# Patient Record
Sex: Female | Born: 1950 | Race: White | Hispanic: No | Marital: Married | Smoking: Former smoker
Health system: Southern US, Community
[De-identification: ages and names within clinical notes are randomized; demographics above are authoritative.]

## PROBLEM LIST (undated history)

## (undated) DIAGNOSIS — T7840XA Allergy, unspecified, initial encounter: Secondary | ICD-10-CM

## (undated) DIAGNOSIS — R7989 Other specified abnormal findings of blood chemistry: Secondary | ICD-10-CM

## (undated) DIAGNOSIS — IMO0002 Reserved for concepts with insufficient information to code with codable children: Secondary | ICD-10-CM

## (undated) DIAGNOSIS — M1990 Unspecified osteoarthritis, unspecified site: Secondary | ICD-10-CM

## (undated) DIAGNOSIS — Z9889 Other specified postprocedural states: Secondary | ICD-10-CM

## (undated) DIAGNOSIS — R7303 Prediabetes: Secondary | ICD-10-CM

## (undated) DIAGNOSIS — R011 Cardiac murmur, unspecified: Secondary | ICD-10-CM

## (undated) DIAGNOSIS — R112 Nausea with vomiting, unspecified: Secondary | ICD-10-CM

## (undated) DIAGNOSIS — M810 Age-related osteoporosis without current pathological fracture: Secondary | ICD-10-CM

## (undated) DIAGNOSIS — Z5189 Encounter for other specified aftercare: Secondary | ICD-10-CM

## (undated) DIAGNOSIS — J45909 Unspecified asthma, uncomplicated: Secondary | ICD-10-CM

## (undated) DIAGNOSIS — E669 Obesity, unspecified: Secondary | ICD-10-CM

## (undated) DIAGNOSIS — I499 Cardiac arrhythmia, unspecified: Secondary | ICD-10-CM

## (undated) DIAGNOSIS — Z86010 Personal history of colonic polyps: Secondary | ICD-10-CM

## (undated) DIAGNOSIS — C801 Malignant (primary) neoplasm, unspecified: Secondary | ICD-10-CM

## (undated) DIAGNOSIS — R319 Hematuria, unspecified: Secondary | ICD-10-CM

## (undated) DIAGNOSIS — J189 Pneumonia, unspecified organism: Secondary | ICD-10-CM

## (undated) DIAGNOSIS — R42 Dizziness and giddiness: Secondary | ICD-10-CM

## (undated) DIAGNOSIS — Z889 Allergy status to unspecified drugs, medicaments and biological substances status: Secondary | ICD-10-CM

## (undated) DIAGNOSIS — I4891 Unspecified atrial fibrillation: Secondary | ICD-10-CM

## (undated) HISTORY — DX: Pneumonia, unspecified organism: J18.9

## (undated) HISTORY — PX: SUPERFICIAL PERONEAL NERVE RELEASE: SHX6200

## (undated) HISTORY — PX: CARDIAC ELECTROPHYSIOLOGY STUDY AND ABLATION: SHX1294

## (undated) HISTORY — PX: CERVICAL CONE BIOPSY: SUR198

## (undated) HISTORY — DX: Age-related osteoporosis without current pathological fracture: M81.0

## (undated) HISTORY — DX: Personal history of colonic polyps: Z86.010

## (undated) HISTORY — DX: Dizziness and giddiness: R42

## (undated) HISTORY — DX: Unspecified osteoarthritis, unspecified site: M19.90

## (undated) HISTORY — DX: Reserved for concepts with insufficient information to code with codable children: IMO0002

## (undated) HISTORY — DX: Encounter for other specified aftercare: Z51.89

## (undated) HISTORY — DX: Unspecified atrial fibrillation: I48.91

## (undated) HISTORY — DX: Other specified abnormal findings of blood chemistry: R79.89

## (undated) HISTORY — DX: Obesity, unspecified: E66.9

## (undated) HISTORY — DX: Allergy, unspecified, initial encounter: T78.40XA

---
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
## 1991-03-19 HISTORY — PX: OTHER SURGICAL HISTORY: SHX169

## 1997-03-18 HISTORY — PX: ROTATOR CUFF REPAIR: SHX139

## 2004-12-20 ENCOUNTER — Emergency Department (HOSPITAL_COMMUNITY): Admission: EM | Admit: 2004-12-20 | Discharge: 2004-12-20 | Payer: Self-pay | Admitting: Emergency Medicine

## 2005-04-05 ENCOUNTER — Emergency Department (HOSPITAL_COMMUNITY): Admission: EM | Admit: 2005-04-05 | Discharge: 2005-04-05 | Payer: Self-pay | Admitting: *Deleted

## 2005-12-09 ENCOUNTER — Inpatient Hospital Stay (HOSPITAL_BASED_OUTPATIENT_CLINIC_OR_DEPARTMENT_OTHER): Admission: RE | Admit: 2005-12-09 | Discharge: 2005-12-09 | Payer: Self-pay | Admitting: Cardiology

## 2007-03-19 HISTORY — PX: UPPER GASTROINTESTINAL ENDOSCOPY: SHX188

## 2007-03-19 HISTORY — PX: OOPHORECTOMY: SHX86

## 2007-03-19 HISTORY — PX: COLONOSCOPY: SHX174

## 2007-09-02 LAB — HM COLONOSCOPY

## 2007-12-07 ENCOUNTER — Ambulatory Visit (HOSPITAL_BASED_OUTPATIENT_CLINIC_OR_DEPARTMENT_OTHER): Admission: RE | Admit: 2007-12-07 | Discharge: 2007-12-07 | Payer: Self-pay | Admitting: Obstetrics and Gynecology

## 2007-12-07 ENCOUNTER — Encounter: Payer: Self-pay | Admitting: Obstetrics and Gynecology

## 2008-06-10 ENCOUNTER — Other Ambulatory Visit: Admission: RE | Admit: 2008-06-10 | Discharge: 2008-06-10 | Payer: Self-pay | Admitting: Obstetrics and Gynecology

## 2008-07-06 ENCOUNTER — Inpatient Hospital Stay (HOSPITAL_COMMUNITY): Admission: RE | Admit: 2008-07-06 | Discharge: 2008-07-09 | Payer: Self-pay | Admitting: Orthopedic Surgery

## 2010-01-29 ENCOUNTER — Ambulatory Visit (HOSPITAL_COMMUNITY): Admission: RE | Admit: 2010-01-29 | Discharge: 2010-01-30 | Payer: Self-pay | Admitting: Orthopedic Surgery

## 2010-04-26 ENCOUNTER — Other Ambulatory Visit (HOSPITAL_COMMUNITY): Payer: Self-pay | Admitting: Orthopedic Surgery

## 2010-04-26 DIAGNOSIS — M25552 Pain in left hip: Secondary | ICD-10-CM

## 2010-04-26 DIAGNOSIS — T84031A Mechanical loosening of internal left hip prosthetic joint, initial encounter: Secondary | ICD-10-CM

## 2010-05-08 ENCOUNTER — Ambulatory Visit (HOSPITAL_COMMUNITY)
Admission: RE | Admit: 2010-05-08 | Discharge: 2010-05-08 | Disposition: A | Discharge status: Home / Self Care | Payer: BC Managed Care – PPO | Source: Ambulatory Visit | Attending: Orthopedic Surgery | Admitting: Orthopedic Surgery

## 2010-05-08 DIAGNOSIS — T84031A Mechanical loosening of internal left hip prosthetic joint, initial encounter: Secondary | ICD-10-CM

## 2010-05-08 DIAGNOSIS — Z96649 Presence of unspecified artificial hip joint: Secondary | ICD-10-CM | POA: Insufficient documentation

## 2010-05-08 DIAGNOSIS — M25552 Pain in left hip: Secondary | ICD-10-CM

## 2010-05-08 DIAGNOSIS — R946 Abnormal results of thyroid function studies: Secondary | ICD-10-CM | POA: Insufficient documentation

## 2010-05-08 MED ORDER — TECHNETIUM TC 99M MEDRONATE IV KIT
25.0000 | PACK | Freq: Once | INTRAVENOUS | Status: AC | PRN
  Administered 2010-05-08: 25 via INTRAVENOUS

## 2010-05-08 MED ORDER — TECHNETIUM TC 99M MEDRONATE IV KIT: 25.0000 | PACK | Freq: Once | INTRAVENOUS | Status: DC | PRN

## 2010-05-29 LAB — COMPREHENSIVE METABOLIC PANEL
ALT: 21 U/L (ref 0–35)
AST: 24 U/L (ref 0–37)
Albumin: 4.3 g/dL (ref 3.5–5.2)
Alkaline Phosphatase: 53 U/L (ref 39–117)
BUN: 15 mg/dL (ref 6–23)
CO2: 27 mEq/L (ref 19–32)
Calcium: 9.5 mg/dL (ref 8.4–10.5)
Chloride: 105 mEq/L (ref 96–112)
Creatinine, Ser: 0.87 mg/dL (ref 0.4–1.2)
GFR calc Af Amer: >60 mL/min (ref >60)
GFR calc non Af Amer: >60 mL/min (ref >60)
Glucose, Bld: 104 mg/dL — ABNORMAL HIGH (ref 70–99)
Potassium: 4.2 mEq/L (ref 3.5–5.1)
Sodium: 141 mEq/L (ref 135–145)
Total Bilirubin: 0.7 mg/dL (ref 0.3–1.2)
Total Protein: 7.1 g/dL (ref 6.0–8.3)

## 2010-05-29 LAB — CBC
HCT: 39.1 % (ref 36.0–46.0)
Hemoglobin: 13.6 g/dL (ref 12.0–15.0)
MCH: 32.2 pg (ref 26.0–34.0)
MCHC: 34.7 g/dL (ref 30.0–36.0)
MCV: 93 fL (ref 78.0–100.0)
Platelets: 203 10*3/uL (ref 150–400)
RBC: 4.21 MIL/uL (ref 3.87–5.11)
RDW: 13.3 % (ref 11.5–15.5)
WBC: 5.9 10*3/uL (ref 4.0–10.5)

## 2010-05-29 LAB — TYPE AND SCREEN
ABO/RH(D): A POS
Antibody Screen: NEGATIVE

## 2010-05-29 LAB — URINALYSIS, ROUTINE W REFLEX MICROSCOPIC
Bilirubin Urine: NEGATIVE
Glucose, UA: NEGATIVE mg/dL
Ketones, ur: NEGATIVE mg/dL
Leukocytes, UA: NEGATIVE
Nitrite: NEGATIVE
Protein, ur: NEGATIVE mg/dL
Specific Gravity, Urine: 1.01 (ref 1.005–1.030)
Urobilinogen, UA: 0.2 mg/dL (ref 0.0–1.0)
pH: 6.5 (ref 5.0–8.0)

## 2010-05-29 LAB — BODY FLUID CULTURE
Culture: NO GROWTH
Gram Stain: NONE SEEN

## 2010-05-29 LAB — PROTIME-INR
INR: 0.97 (ref 0.00–1.49)
Prothrombin Time: 13.1 seconds (ref 11.6–15.2)

## 2010-05-29 LAB — SURGICAL PCR SCREEN
MRSA, PCR: NEGATIVE
Staphylococcus aureus: NEGATIVE

## 2010-05-29 LAB — ANAEROBIC CULTURE: Gram Stain: NONE SEEN

## 2010-05-29 LAB — GRAM STAIN: Gram Stain: NONE SEEN

## 2010-05-29 LAB — APTT: aPTT: 32 seconds (ref 24–37)

## 2010-05-29 LAB — URINE MICROSCOPIC-ADD ON

## 2010-05-31 ENCOUNTER — Ambulatory Visit (HOSPITAL_COMMUNITY)
Admission: RE | Admit: 2010-05-31 | Discharge: 2010-05-31 | Disposition: A | Discharge status: Home / Self Care | Payer: BC Managed Care – PPO | Source: Ambulatory Visit | Attending: Orthopedic Surgery | Admitting: Orthopedic Surgery

## 2010-05-31 ENCOUNTER — Encounter (HOSPITAL_COMMUNITY): Payer: BC Managed Care – PPO

## 2010-05-31 ENCOUNTER — Other Ambulatory Visit: Payer: Self-pay | Admitting: Orthopedic Surgery

## 2010-05-31 ENCOUNTER — Other Ambulatory Visit (HOSPITAL_COMMUNITY): Payer: Self-pay | Admitting: Orthopedic Surgery

## 2010-05-31 DIAGNOSIS — Z01818 Encounter for other preprocedural examination: Secondary | ICD-10-CM | POA: Insufficient documentation

## 2010-05-31 DIAGNOSIS — I4891 Unspecified atrial fibrillation: Secondary | ICD-10-CM | POA: Insufficient documentation

## 2010-05-31 DIAGNOSIS — J45909 Unspecified asthma, uncomplicated: Secondary | ICD-10-CM | POA: Insufficient documentation

## 2010-05-31 DIAGNOSIS — Z01812 Encounter for preprocedural laboratory examination: Secondary | ICD-10-CM | POA: Insufficient documentation

## 2010-05-31 DIAGNOSIS — M329 Systemic lupus erythematosus, unspecified: Secondary | ICD-10-CM | POA: Insufficient documentation

## 2010-05-31 DIAGNOSIS — I10 Essential (primary) hypertension: Secondary | ICD-10-CM | POA: Insufficient documentation

## 2010-05-31 DIAGNOSIS — Z01811 Encounter for preprocedural respiratory examination: Secondary | ICD-10-CM

## 2010-05-31 DIAGNOSIS — Z96649 Presence of unspecified artificial hip joint: Secondary | ICD-10-CM | POA: Insufficient documentation

## 2010-05-31 DIAGNOSIS — M25559 Pain in unspecified hip: Secondary | ICD-10-CM | POA: Insufficient documentation

## 2010-05-31 DIAGNOSIS — Z79899 Other long term (current) drug therapy: Secondary | ICD-10-CM | POA: Insufficient documentation

## 2010-05-31 LAB — CBC
HCT: 38.2 % (ref 36.0–46.0)
Hemoglobin: 12.4 g/dL (ref 12.0–15.0)
MCH: 29.8 pg (ref 26.0–34.0)
MCHC: 32.5 g/dL (ref 30.0–36.0)
MCV: 91.8 fL (ref 78.0–100.0)
Platelets: 206 10*3/uL (ref 150–400)
RBC: 4.16 MIL/uL (ref 3.87–5.11)
RDW: 13.1 % (ref 11.5–15.5)
WBC: 5.4 10*3/uL (ref 4.0–10.5)

## 2010-05-31 LAB — COMPREHENSIVE METABOLIC PANEL
ALT: 18 U/L (ref 0–35)
AST: 22 U/L (ref 0–37)
Albumin: 3.9 g/dL (ref 3.5–5.2)
Alkaline Phosphatase: 61 U/L (ref 39–117)
BUN: 14 mg/dL (ref 6–23)
CO2: 28 mEq/L (ref 19–32)
Calcium: 9.4 mg/dL (ref 8.4–10.5)
Chloride: 106 mEq/L (ref 96–112)
Creatinine, Ser: 0.86 mg/dL (ref 0.4–1.2)
GFR calc Af Amer: >60 mL/min (ref >60)
GFR calc non Af Amer: >60 mL/min (ref >60)
Glucose, Bld: 94 mg/dL (ref 70–99)
Potassium: 4.1 mEq/L (ref 3.5–5.1)
Sodium: 139 mEq/L (ref 135–145)
Total Bilirubin: 0.4 mg/dL (ref 0.3–1.2)
Total Protein: 7 g/dL (ref 6.0–8.3)

## 2010-05-31 LAB — URINALYSIS, ROUTINE W REFLEX MICROSCOPIC
Glucose, UA: NEGATIVE mg/dL
Ketones, ur: NEGATIVE mg/dL
Leukocytes, UA: NEGATIVE
Protein, ur: NEGATIVE mg/dL
Urobilinogen, UA: 0.2 mg/dL (ref 0.0–1.0)

## 2010-05-31 LAB — URINE MICROSCOPIC-ADD ON

## 2010-05-31 LAB — SURGICAL PCR SCREEN: MRSA, PCR: NEGATIVE

## 2010-05-31 LAB — PROTIME-INR
INR: 0.99 (ref 0.00–1.49)
Prothrombin Time: 13.3 seconds (ref 11.6–15.2)

## 2010-05-31 LAB — APTT: aPTT: 31 seconds (ref 24–37)

## 2010-06-06 ENCOUNTER — Inpatient Hospital Stay (HOSPITAL_COMMUNITY)
Admission: RE | Admit: 2010-06-06 | Discharge: 2010-06-08 | DRG: 817 | Disposition: A | Discharge status: Home Health | Payer: BC Managed Care – PPO | Source: Ambulatory Visit | Attending: Orthopedic Surgery | Admitting: Orthopedic Surgery

## 2010-06-06 ENCOUNTER — Inpatient Hospital Stay (HOSPITAL_COMMUNITY): Payer: BC Managed Care – PPO

## 2010-06-06 DIAGNOSIS — T84099A Other mechanical complication of unspecified internal joint prosthesis, initial encounter: Principal | ICD-10-CM | POA: Diagnosis present

## 2010-06-06 DIAGNOSIS — Z8701 Personal history of pneumonia (recurrent): Secondary | ICD-10-CM

## 2010-06-06 DIAGNOSIS — Z87440 Personal history of urinary (tract) infections: Secondary | ICD-10-CM

## 2010-06-06 DIAGNOSIS — I4891 Unspecified atrial fibrillation: Secondary | ICD-10-CM | POA: Diagnosis present

## 2010-06-06 DIAGNOSIS — Y831 Surgical operation with implant of artificial internal device as the cause of abnormal reaction of the patient, or of later complication, without mention of misadventure at the time of the procedure: Secondary | ICD-10-CM | POA: Diagnosis present

## 2010-06-06 DIAGNOSIS — K219 Gastro-esophageal reflux disease without esophagitis: Secondary | ICD-10-CM | POA: Diagnosis present

## 2010-06-06 DIAGNOSIS — Z96649 Presence of unspecified artificial hip joint: Secondary | ICD-10-CM

## 2010-06-06 DIAGNOSIS — Z87898 Personal history of other specified conditions: Secondary | ICD-10-CM

## 2010-06-06 DIAGNOSIS — Z7982 Long term (current) use of aspirin: Secondary | ICD-10-CM

## 2010-06-06 DIAGNOSIS — I10 Essential (primary) hypertension: Secondary | ICD-10-CM | POA: Diagnosis present

## 2010-06-06 LAB — GRAM STAIN

## 2010-06-06 LAB — TYPE AND SCREEN: ABO/RH(D): A POS

## 2010-06-07 LAB — BASIC METABOLIC PANEL
Chloride: 102 mEq/L (ref 96–112)
Creatinine, Ser: 0.88 mg/dL (ref 0.4–1.2)
GFR calc Af Amer: >60 mL/min (ref >60)
Potassium: 4.4 mEq/L (ref 3.5–5.1)
Sodium: 139 mEq/L (ref 135–145)

## 2010-06-07 LAB — CBC
HCT: 35.5 % — ABNORMAL LOW (ref 36.0–46.0)
MCHC: 32.7 g/dL (ref 30.0–36.0)
MCV: 91 fL (ref 78.0–100.0)
Platelets: 176 10*3/uL (ref 150–400)
RDW: 13.1 % (ref 11.5–15.5)

## 2010-06-08 LAB — CBC
HCT: 35.1 % — ABNORMAL LOW (ref 36.0–46.0)
Hemoglobin: 11.5 g/dL — ABNORMAL LOW (ref 12.0–15.0)
MCH: 29.9 pg (ref 26.0–34.0)
RBC: 3.84 MIL/uL — ABNORMAL LOW (ref 3.87–5.11)

## 2010-06-08 LAB — BASIC METABOLIC PANEL
CO2: 27 mEq/L (ref 19–32)
Chloride: 102 mEq/L (ref 96–112)
Creatinine, Ser: 0.77 mg/dL (ref 0.4–1.2)
GFR calc Af Amer: >60 mL/min (ref >60)

## 2010-06-10 LAB — BODY FLUID CULTURE: Culture: NO GROWTH

## 2010-06-11 LAB — ANAEROBIC CULTURE

## 2010-06-11 NOTE — Op Note (Signed)
Donna Moon, Donna Moon                   ACCOUNT NO.:  [id number]  MEDICAL RECORD NO.:  [id number]           PATIENT TYPE:  I  LOCATION:  1618                         FACILITY:  [facility]  PHYSICIAN:  Ollen Gross, M.D.    DATE OF BIRTH:  06-25-1950  DATE OF PROCEDURE:  06/06/2010 DATE OF DISCHARGE:                              OPERATIVE REPORT   PREOPERATIVE DIAGNOSIS:  Painful left total hip arthroplasty with possible bearing failure.  POSTOPERATIVE DIAGNOSIS:  Painful left total hip arthroplasty with possible bearing failure.  PROCEDURE:  Left hip bearing revision.  SURGEON:  Ollen Gross, MD  ASSISTANT:  Rozell Searing, PA-C  ANESTHESIA:  General.  ESTIMATED BLOOD LOSS:  200.  DRAIN:  Hemovac x1.  COMPLICATIONS:  None.  CONDITION:  Stable to Recovery.  BRIEF CLINICAL NOTE:  Ms. Groome is a 60 year old female very bizarre complex history in regard to her left hip.  She had an index total hip arthroplasty done a few years ago, did fantastic up until last spring when she had a cardiac catheterization.  She started to develop significant pain after that.  It was felt that this was possibly due to hematoma, possible nerve injury.  A lot of her pain was weightbearing in nature.  She had an exploration to see if it was a hematoma.  There was some fluid present at that time which was not infectious.  We noted that she had some abductor dysfunction and a tear.  This was repaired at that time.  Unfortunately, she did not have any improvement.  All of her studies to date have not shown signs of any implant loosening or signs of any infection.  Given her persistent pain, it was felt that some of this could potentially be an allergic reaction to the metal bearing. She subsequently presents today for exploration and possible bearing revision.  PROCEDURE IN DETAIL:  After successful administration of general anesthetic, the patient was placed in the right lateral decubitus position  with the left side up and held with a hip positioner.  Her left lower extremity was isolated from its perineum with plastic drapes and prepped and draped in usual sterile fashion.  A previous posterolateral incision was utilized.  Skin cut with 10 blade through subcutaneous tissue to the level of the fascia lata which was incised in line with skin incision.  Upon going into the fascia lata, it was noted there was some necrotic tissue in the bursa.  There was some fluid associated with this, but it is not metal-stained tissue.  It was sent for stat Gram stain which was negative.  I debrided the necrotic tissue in the bursal area.  There was some pale tissue overlying the abductor and I removed that.  Once again, I did not see any metallosis.  We palpated the sciatic nerve and protected that.  I then excised the posterior pseudocapsule off the femur.  The abductor repair from the previous surgery was intact.  The hip was then dislocated.  The femoral head was removed.  There was minimal wear on the trunnion and the femoral head itself did  not show any significant surface scratching.  There was also no evidence of any significant wear on the internal aspect of the femoral head managed junction with the trunnion.  The femur was then retracted anteriorly and again explored the acetabulum.  We thoroughly irrigated and got excellent exposure.  I did not see any significant scratches or any significant wear in the acetabular liner.  The liner was subsequently removed.  Thus, the acetabular component was found to be stable and well fixed within the bone.  There was no lysis behind the shell as I palpated through the screw holes and did not note any lysis. It was a 52-mm Pinnacle acetabular shell.  I changed her from a 36 metal liner to a 32 mm neutral +4 Marathon liner.  The acetabular component as stated is well fixed.  The femoral component was also well fixed and in excellent position.  We then  trialed a 32 +6 head, reduced the hip, and there was great stability.  There was full extension, full external rotation, 70 degrees flexion, 40 degrees adduction, 90 degrees internal rotation, 90 degrees of flexion, and 70 degrees of internal rotation. By placing the left leg on top of the right, I felt as though leg lengths were equal.  Hip was dislocated and the permanent 32 +6 femoral head was placed.  I went with a metal head instead of a ceramic as there was some slight scratching on the trunnion.  It was not enough to warrant changing the stem, but any scratch at all I felt would not be amenable to using a ceramic head.  The hip was then reduced with the same stability parameters.  The wound was then copiously irrigated with saline solution and then the short rotators and capsule reattached to the femur through drill holes with Ethibond suture.  Fascia lata was closed over Hemovac drain with interrupted #1 Vicryl, subcutaneous closed with #1-0 and #2-0 Vicryl, and subcuticular with running 4-0 Monocryl.  Drain was then hooked to suction.  Incision was cleaned and dried and Steri-Strips and a bulky sterile dressing applied.  She wasthen placed into a knee immobilizer, awakened, and transported to Recovery in stable condition.     Ollen Gross, M.D.     FA/MEDQ  D:  06/06/2010  T:  06/06/2010  Job:  [id number]  Electronically Signed by Ollen Gross M.D. on 06/11/2010 07:11:33 AM

## 2010-06-12 NOTE — H&P (Addendum)
[name], [name] NO.:  [id number]  MEDICAL RECORD NO.:  [id number]           PATIENT TYPE:  LOCATION:                                 FACILITY:  PHYSICIAN:  Ollen Gross, M.D.    DATE OF BIRTH:  06-Jun-1950  DATE OF ADMISSION: DATE OF DISCHARGE:                             HISTORY & PHYSICAL   CHIEF COMPLAINT:  Left hip pain with recurrent subluxation of the left hip.  BRIEF HISTORY:  Donna Moon had a left total hip arthroplasty back in April 2010.  She underwent cardiac catheterization.  Since that time, she has started to have pain and discomfort in the left hip.  Dr. Lequita Halt ordered a bone scan that revealed no signs of loosening; however, persistent pain.  He was quite concerned about inflammatory reaction to metal and the fact that she is having pain with weightbearing.  She now presents for exploratory surgery and possible revision to either a ceramic on poly or metal on poly rather than metal on metal.  ALLERGIES:  LEXAPRO.  PRIMARY CARE PHYSICIAN:  Dr. Roda Shutters.  CARDIOLOGIST:  Dr. [facility].  CURRENT MEDICATIONS: 1. Prevacid. 2. Flecainide. 3. Metoprolol. 4. Aspirin. 5. Flonase. 6. Tramadol. 7. Neoportin.  PAST MEDICAL HISTORY: 1. Asthma. 2. Heart disease. 3. Heart arrhythmia. 4. Atrial fibrillation. 5. Reflux disease.  PAST SURGICAL HISTORY: 1. Carpal tunnel surgery in 1994 and 1995. 2. Appendectomy in 1968. 3. C-section in 1986. 4. Rotator cuff repair in 1997. 5. Cardiac ablation in 2007 and again in 2011. 6. Left total hip arthroplasty in 2010. 7. Left hip arthroscopy in 2011.  FAMILY HISTORY:  Father passed at the age of 38.  He had a heart attack. Mother passed at the age of 49 of an aneurysm.  SOCIAL HISTORY:  The patient is married.  She works as a Furniture conservator/restorer.  She admits past use of drugs.  She denies the use of alcohol. One child.  She lives at home with her husband.  She does plan to go home  following her hospital stay.  REVIEW OF SYSTEMS:  GENERAL:.  No fevers, chills or weight change. HEENT:  Negative for rash or lesions.  RESPIRATORY:  Negative for shortness of breath at rest or with exertion.  CARDIOVASCULAR:  Negative for chest pain or palpitations.  GI:  Negative for nausea or vomiting, positive for constipation.  GU:  Negative for hematuria, dysuria. MUSCULOSKELETAL:  Positive for back pain and morning stiffness.  PHYSICAL EXAMINATION:  VITAL SIGNS:  Pulse 68, respirations 18, blood pressure 118/76 in the left arm. GENERAL:  Ms. Donna Moon is alert and oriented x3.  She is a pleasant 60 year old female.  She has a stated height of 5 feet 9 inches a stated weight of 200 pounds.  [name] is alert and oriented x3.  She is well-developed, in no apparent distress. HEENT:  Extraocular movements intact. NECK:  Supple full range of motion without lymphadenopathy. LUNGS:  Clear to auscultation bilaterally without wheezes. HEART:  Regular rate and rhythm without murmur. ABDOMEN:  Bowel sounds present in all 4. EXTREMITIES:  She  is status.  Marland Kitchen  She walks with an antalgic gait on the left. vascular carotid pulses without bruits.  RADIOGRAPHS:  reveals no signs of     Rozell Searing, PAC   ______________________________ Ollen Gross, M.D.    LD/MEDQ  D:  06/11/2010  T:  06/11/2010  Job:  [id number]  Electronically Signed by Rozell Searing  on 06/12/2010 10:51:31 AM Electronically Signed by Ollen Gross M.D. on 06/19/2010 07:13:52 AM

## 2010-06-19 NOTE — Discharge Summary (Signed)
Donna Moon, Donna Moon                   ACCOUNT NO.:  [id number]  MEDICAL RECORD NO.:  [id number]           PATIENT TYPE:  I  LOCATION:  1618                         FACILITY:  [facility]  PHYSICIAN:  Ollen Gross, M.D.    DATE OF BIRTH:  1951-02-09  DATE OF ADMISSION:  06/06/2010 DATE OF DISCHARGE:                              DISCHARGE SUMMARY   ADMITTING DIAGNOSES: 1. Painful left total hip arthroplasty with a possible bearing     failure. 2. Asthma. 3. Past history of bronchitis. 4. Past history of pneumonia. 5. Hypertension. 6. Gastroesophageal reflux disease. 7. History of paroxysmal atrial fibrillation (status post ablation). 8. Reflux disease. 9. History urinary tract infections. 10.History of lupus, currently in remission.  DISCHARGE DIAGNOSES: 1. Painful left hip arthroplasty with bearing failure, status post     revision left total hip with bearing revision/liner exchange. 2. Asthma. 3. Past history of bronchitis. 4. Past history of pneumonia. 5. Hypertension. 6. Gastroesophageal reflux disease. 7. History of paroxysmal atrial fibrillation (status post ablation). 8. Reflux disease. 9. History urinary tract infections. 10.History of lupus, currently in remission.  PROCEDURE:  June 06, 2010, revision left total hip with left hip bearing revision/liner exchange.  SURGEON:  Ollen Gross, M.D.  ASSISTANT:  Rozell Searing, [facility]  ANESTHESIA:  General.  CONSULTS:  None.  BRIEF HISTORY:  The patient is a 60 year old female with a very bizarre complex history in regards to her left hip.  She had a left total hip done few years ago.  Did fantastic up until this past spring.  She had a cardiac catheterization and started developing significant pain.  It is felt to be possibly due to hematoma or possible nerve injury.  She had a lot of pain with weightbearing at times.  She had exploration and there was some fluid present but no infection.  No signs of obvious  hematoma. She was noted to have some abductor dysfunction and also a tear which were repaired at that time.  Unfortunately, she did not have any significant improvement.  All of her studies were negative for any type of infection or implant loosening.  Given her persistent pain, however, there is a possibility of potential reaction to metal bearing and it was felt that she would best be served by undergoing exploration and possible bearing exchange.  LABORATORY DATA:  CBC on admission showed hemoglobin of 12.4, hematocrit 38.2, white cell count 5.4, platelets 206,000.  Serial CBCs were followed.  Postop hemoglobin on postop day #1 was 11.6.  Last H and H on postop day #2 stabilized with H and H of 11.5 and 35.1.  White count remained within normal limits.  Chem panel on admission all within normal limits.  Serial BMETs were followed for 48 hours.  Electrolytes remained within normal limits.  Admission PT/INR was 13.3 and 0.99 with a PTT of 31.  Chem panel on admission did show some trace blood, 0 to 2 red cells, otherwise negative.  Blood group type A positive.  Nasal swabs were negative for Staph aureus and negative for MRSA.  The stat Gram stain taken at  time of surgery did not show any organisms. Cultures taken at time of surgery.  Fluid culture with smear showed no organisms and culture showed no growth.  The final was still pending at time of dictation.  Anaerobe culture smear showed no organisms, no anaerobes isolated.  Again, this culture was still in progress and final was pending at time of dictation.  EKG, there is a previous EKG on the chart dated March 30, 2010, sinus bradycardia, otherwise normal.  No significant change was found, confirmed by Dr. Prentice Docker.  HOSPITAL COURSE:  The patient was admitted to [facility], taken to OR, underwent above-stated procedure without complication.  The patient tolerated the procedure well, later transferred to the  recovery room, orthopedic floor, started on p.o. and IV analgesic pain control following surgery.  Had a pretty good night and doing fairly well on the morning of day 1.  The patient was seen in rounds by Dr. Lequita Halt and had brief discussion of surgical and operative findings.  She was allowed to be weightbearing as tolerated since we only did a weightbearing exchange.  She had excellent urinary output on day 1.  Hemoglobin stable at 11.6.  Labs looked good.  Started getting up out of bed and actually did fantastic with mobility.  She was walking about 150 to 200 feet. She was placed back on her home meds with the exception of her aspirin. She was on full dose of aspirin 325 and we decided to reduce her aspirin down to 81 mg while she was on Xarelto but kept her on low-dose baby aspirin while she was on that protocol and then we resumed back up.  She was well on postop day #1 and on postop day #2, she was seen in rounds. The dressing was changed.  Incision looked good.  Hemoglobin was stable. She did well with her therapy and felt she would benefit undergoing discharge.  Arrangements were being made for home health and we decided to let the patient go later that afternoon after therapy.  DISCHARGE PLAN: 1. The patient was discharged home on June 08, 2010. 2. Discharge diagnoses, please see above. 3. Discharge medications:  Xarelto, Robaxin and Percocet.  Continue     home meds of albuterol, flecainide, Flonase, metoprolol, Neurontin,     Prevacid, tramadol.  Please note her full-dose aspirin was being     held but she was started on a baby aspirin of 81 mg.  She remained     on that for the 3 weeks while on Xarelto protocol.  Once she     finishes the Xarelto, she will discontinue the baby aspirin and go     back to her full dose aspirin as preop.  Follow up in 2 weeks.  ACTIVITY:  She is weightbearing as tolerated, hip precautions, total hip protocol.  Home PT for therapy.  DIET:   Heart-healthy diet.  DISPOSITION:  Home.  CONDITION ON DISCHARGE:  Improving.     Alexzandrew L. Julien Girt, P.A.C.   ______________________________ Ollen Gross, M.D.    ALP/MEDQ  D:  06/08/2010  T:  06/08/2010  Job:  [id number]  cc:   Dr. Prentice Docker [facility]  Electronically Signed by Patrica Duel P.A.C. on 06/11/2010 07:32:56 AM Electronically Signed by Ollen Gross M.D. on 06/19/2010 07:13:49 AM

## 2010-06-27 LAB — CBC
HCT: 28 % — ABNORMAL LOW (ref 36.0–46.0)
HCT: 39.2 % (ref 36.0–46.0)
Hemoglobin: 11 g/dL — ABNORMAL LOW (ref 12.0–15.0)
Hemoglobin: 9.9 g/dL — ABNORMAL LOW (ref 12.0–15.0)
Hemoglobin: 13.4 g/dL (ref 12.0–15.0)
MCHC: 34.2 g/dL (ref 30.0–36.0)
MCV: 94.4 fL (ref 78.0–100.0)
MCV: 94.4 fL (ref 78.0–100.0)
RBC: 2.97 MIL/uL — ABNORMAL LOW (ref 3.87–5.11)
RBC: 3.06 MIL/uL — ABNORMAL LOW (ref 3.87–5.11)
RBC: 3.41 MIL/uL — ABNORMAL LOW (ref 3.87–5.11)
RBC: 4.14 MIL/uL (ref 3.87–5.11)
RDW: 13.6 % (ref 11.5–15.5)
WBC: 7.6 10*3/uL (ref 4.0–10.5)

## 2010-06-27 LAB — BASIC METABOLIC PANEL
CO2: 30 mEq/L (ref 19–32)
Calcium: 9.2 mg/dL (ref 8.4–10.5)
Chloride: 108 mEq/L (ref 96–112)
GFR calc Af Amer: >60 mL/min (ref >60)
GFR calc Af Amer: >60 mL/min (ref >60)
GFR calc non Af Amer: >60 mL/min (ref >60)
Potassium: 3.5 mEq/L (ref 3.5–5.1)
Sodium: 137 mEq/L (ref 135–145)
Sodium: 138 mEq/L (ref 135–145)

## 2010-06-27 LAB — COMPREHENSIVE METABOLIC PANEL
Alkaline Phosphatase: 46 U/L (ref 39–117)
BUN: 19 mg/dL (ref 6–23)
CO2: 28 mEq/L (ref 19–32)
GFR calc non Af Amer: >60 mL/min (ref >60)
Glucose, Bld: 124 mg/dL — ABNORMAL HIGH (ref 70–99)
Potassium: 4.1 mEq/L (ref 3.5–5.1)
Total Bilirubin: 0.7 mg/dL (ref 0.3–1.2)
Total Protein: 6.7 g/dL (ref 6.0–8.3)

## 2010-06-27 LAB — PROTIME-INR
INR: 1.1 (ref 0.00–1.49)
INR: 1.9 — ABNORMAL HIGH (ref 0.00–1.49)
INR: 0.9 (ref 0.00–1.49)
Prothrombin Time: 18.2 seconds — ABNORMAL HIGH (ref 11.6–15.2)
Prothrombin Time: 12.5 seconds (ref 11.6–15.2)

## 2010-06-27 LAB — TYPE AND SCREEN: Antibody Screen: NEGATIVE

## 2010-06-27 LAB — URINALYSIS, ROUTINE W REFLEX MICROSCOPIC
Bilirubin Urine: NEGATIVE
Ketones, ur: NEGATIVE mg/dL
Protein, ur: NEGATIVE mg/dL
Urobilinogen, UA: 0.2 mg/dL (ref 0.0–1.0)

## 2010-07-31 NOTE — H&P (Signed)
NAMETELLY, BROBERG NO.:  [id number]   MEDICAL RECORD NO.:  [id number]          PATIENT TYPE:  INP   LOCATION:  1601                         FACILITY:  [facility]   PHYSICIAN:  Ollen Gross, M.D.    DATE OF BIRTH:  10-28-1950   DATE OF ADMISSION:  07/06/2008  DATE OF DISCHARGE:                              HISTORY & PHYSICAL   CHIEF COMPLAINT:  Left hip pain.   HISTORY OF PRESENT ILLNESS:  The patient is a 60 year old female who has  been seen by Dr. Lequita Halt for ongoing left knee left leg pain for quite  some time now, radiates in and around the hip, down the lateral leg,  starting in the groin into the thigh.  She has been seen in the office  and found to have end-stage arthritis.  She has reached the point where  she would benefit undergoing surgical intervention.  Risks and benefits  have been discussed.  She has been seen by Dr. Donnie Aho and her medical  physician, Dr. Lewis Moccasin, and felt to be stable for surgery.   ALLERGIES:  NAPROXEN causes a red rash.   INTOLERANCES:  Lexapro causes flu-like symptoms.   CURRENT MEDICATIONS:  Prevacid, Allegra, Flonase, Mobic, Toprol,  aspirin, hydrochlorothiazide, amlodipine, calcium.   PAST MEDICAL HISTORY:  1. Asthma/emphysema.  2. History of bronchitis.  3. History of pneumonia.  4. Hypertension.  5. Gastroesophageal reflux disease.  6. History of paroxysmal atrial fibrillation.  7. History of urinary tract infections.   PAST SURGICAL HISTORY:  1. Appendix surgery 1968.  2. C-section 1986.  3. Carpal tunnel surgery.  4. Rotator cuff surgery.  5. Heart ablation procedure 2006.  6. Excision of ovarian tumor in 2009.   FAMILY HISTORY:  Father with heart attack and cancer.  Mother with heart  disease and cancer.  Diabetes, heart disease and lupus runs in the  family.   SOCIAL HISTORY:  Married, works as a Furniture conservator/restorer, past smoker -  quit 1989, no alcohol, one child.  Husband will be assisting with care  after surgery.  She has four steps entering her home.   REVIEW OF SYSTEMS:  GENERAL:  No fevers, chills or night sweats.  NEUROLOGICAL:  No seizures, syncope or paralysis.  RESPIRATORY:  No  shortness breath, productive cough or hemoptysis.  CARDIOVASCULAR:  No  chest pain, angina, orthopnea.  She does have a history of atrial  fibrillation.  GI:  No nausea, vomiting, diarrhea, constipation.  GU:  No dysuria, hematuria, discharge.  MUSCULOSKELETAL:  Left hip.   PHYSICAL EXAMINATION:  VITAL SIGNS:  Pulse 64, respirations 16, blood  pressure 136/68.  GENERAL:  A 60 year old white female well-nourished, well-developed,  overweight, no acute distress.  She is alert, oriented, cooperative,  good historian.  HEENT:  Normocephalic, atraumatic.  Pupils round and reactive.  EOMs  intact.  NECK:  Supple.  CHEST:  Clear.  HEART:  Regular rate and rhythm with a grade 2/6 systolic ejection  murmur best heard over the aortic point.  ABDOMEN:  Soft, nontender.  Bowel sounds present.  RECTAL/BREASTS/GENITALIA:  Not done, not pertinent to present illness.  EXTREMITIES:  Left hip.  Motor function intact.  Pulses noted to be  intact.  Sensation is intact throughout the left lower extremity.   IMPRESSION:  Osteoarthritis, left hip.   PLAN:  The patient admitted to [facility] to undergo a left  total hip replacement arthroplasty.  Surgery will be performed by Ollen Gross.      Alexzandrew L. Perkins, P.A.C.      Ollen Gross, M.D.  Electronically Signed    ALP/MEDQ  D:  07/07/2008  T:  07/08/2008  Job:  [id number]   cc:   Ollen Gross, M.D.  Fax: [phone]   W. Viann Fish, M.D.  Fax: [phone]  Email: [email or web address]   Claretta Fraise

## 2010-07-31 NOTE — Op Note (Signed)
[name], FLAGLER NO.:  [id number]   MEDICAL RECORD NO.:  [id number]          PATIENT TYPE:  INP   LOCATION:  0006                         FACILITY:  [facility]   PHYSICIAN:  Ollen Gross, M.D.    DATE OF BIRTH:  1950-03-25   DATE OF PROCEDURE:  07/06/2008  DATE OF DISCHARGE:                               OPERATIVE REPORT   PREOPERATIVE DIAGNOSIS:  Osteoarthritis left hip.   POSTOPERATIVE DIAGNOSIS:  Osteoarthritis left hip.   PROCEDURE:  Left total hip arthroplasty.   SURGEON:  Dr. Lequita Halt.   ASSISTANT:  Avel Peace, PA-C.   ANESTHESIA:  General.   ESTIMATED BLOOD LOSS:  400.   DRAIN:  Hemovac x1.   COMPLICATIONS:  None.   CONDITION:  Stable to recovery room.   BRIEF CLINICAL NOTE:  Donna Moon is a 60 year old female with end-stage  arthritis of the left hip with progressively worsening pain and  dysfunction.  She has failed nonoperative management and presents for  total hip arthroplasty.   PROCEDURE IN DETAIL:  After the successful administration of general  anesthetic, the patient was placed in the right lateral decubitus  position with the left side up and held with the hip positioner.  The  left lower extremity was isolated from her perineum with plastic drapes  and prepped and draped in the usual sterile fashion.  A short  posterolateral incision was made with a 10 blade through subcutaneous  tissue to the level of the fascia lata which was incised in line with  the skin incision.  The sciatic nerve was palpated and protected and the  short external rotators isolated off the femur.  A capsulotomy was  performed and the hip was dislocated.  The center of the femoral head  was marked and the trial prosthesis was placed such that the center of  the trial head corresponded to the center of the native femoral head.  Osteotomy lines were marked on the femoral neck and osteotomy made with  an oscillating saw.  The femoral head was removed and the  femur  retracted anteriorly to gain acetabular exposure.   Acetabular retractors were placed and labrum and osteophytes removed.  Acetabular reaming was initiated at 45 mm, coursing increments of 2-51  mm and then a 52-mm pinnacle acetabular shell was placed in anatomic  position.  We did not need any additional screw fixation.  The apex hole  eliminator was placed and then the permanent 36 mm neutral Ultramet  metal liner was placed for metal-on-metal hip replacement.   The femur was prepared with the canal finder and irrigation.  Axial  reaming was performed to 15.5 mm, proximal reaming to 20-F and the  sleeve machined to a large.  The 20-F large trial sleeve was placed with  a 20 x 15 stem and a 36 +8 neck matching native anteversion.  A 36 +0  head was placed.  It reduced easily, so we went to 36 +3 which had more  appropriate soft tissue tension.  She had great stability with full  extension, full external rotation,  70 degrees of flexion, 40 degrees of  adduction, 90 degrees of internal rotation, 90 degrees of flexion and 70  degrees of internal rotation.  By placing the left leg on top of the  right, I felt as though the leg lengths were equal.  The hip was then  dislocated and the trials removed.  The permanent 20-F large sleeve was  placed with a 20 x 15 stem and 36 +8 neck matching native anteversion.  The 36 +3 head was placed and the hip was reduced with the same  stability parameters.  The wound was copiously irrigated with saline  solution and capsule and short rotators reattached to the femur through  drill holes.  The fascia lata was closed over a Hemovac drain with  interrupted #1 Vicryl, subcu closed with #1-0 and #2-0 Vicryl and  subcuticular running 4-0 Monocryl.  The drain was hooked to suction.  The incision cleaned and dried and Steri-Strips and  a bulky sterile  dressing applied.  She was then placed into a knee immobilizer, awakened  and transported to recovery  in stable condition.      Ollen Gross, M.D.  Electronically Signed     FA/MEDQ  D:  07/06/2008  T:  07/06/2008  Job:  [id number]

## 2010-07-31 NOTE — Op Note (Signed)
[name], [name]                   ACCOUNT NO.:  [id number]   MEDICAL RECORD NO.:  [id number]          PATIENT TYPE:  AMB   LOCATION:  [facility]                         FACILITY:  [facility]   PHYSICIAN:  Bertram Millard. Dahlstedt, M.D.DATE OF BIRTH:  07-13-1950   DATE OF PROCEDURE:  12/07/2007  DATE OF DISCHARGE:  12/07/2007                               OPERATIVE REPORT   REASON FOR CONSULTATION:  Intraoperative consultation for rule out of  ureteral injury.   BRIEF HISTORY:  This 60 year old female was undergoing a laparoscopic-  assisted hysterectomy by Dr. Tresa Res.  Visualization of the right ureter  was quite easy during the procedure.  Left ureter was difficult in its  visualization/identification.  Urologic consultation was requested to  rule out ureteral injury.   Upon presentation to the operating room, Dr. Tresa Res had placed the  cystoscope.  Cystoscopic evaluation of the bladder was normal.  Indigo  carmine had been given.  The urine was somewhat blue in the bladder.  Bilateral ureteral catheterization was performed with the 6-French open-  ended catheter.  It passed proximally quite easily.  Additionally, the  contrast was seen effluxing from each ureter.  The ureters were somewhat  laterally placed, and the orifices were somewhat patulous.  However, no  other cystoscopic abnormalities were noted.  The bladder was drained,  and this urologic procedure terminated.      Bertram Millard. Dahlstedt, M.D.  Electronically Signed     SMD/MEDQ  D:  12/11/2007  T:  12/12/2007  Job:  [id number]   cc:   Aram Beecham P. Romine, M.D.  Fax: [phone]

## 2010-07-31 NOTE — Op Note (Signed)
NAMESIBLE, STRALEY                   ACCOUNT NO.:  [id number]   MEDICAL RECORD NO.:  [id number]          PATIENT TYPE:  AMB   LOCATION:  [facility]                         FACILITY:  [facility]   PHYSICIAN:  Cynthia P. Romine, M.D.DATE OF BIRTH:  December 11, 1950   DATE OF PROCEDURE:  12/07/2007  DATE OF DISCHARGE:                               OPERATIVE REPORT   PREOPERATIVE DIAGNOSIS:  Left ovarian solid mass, thickened endometrium  on pelvic ultrasound done preoperatively.   POSTOPERATIVE DIAGNOSIS:  Left ovarian solid mass, thickened endometrium  on pelvic ultrasound done preoperatively.   PROCEDURE:  Laparoscopic bilateral salpingo-oophorectomy, dilation and  curettage and cystoscopy.   SURGEON:  Cynthia P. Tresa Res, M.D.; Bertram Millard. Dahlstedt, M.D. who did  the cystoscopy as an intraoperative consult.   ANESTHESIA:  General endotracheal.   ESTIMATED BLOOD LOSS:  50 mL.   COMPLICATIONS:  None.   PROCEDURE:  The patient was taken to the operating room and after  induction of adequate general endotracheal anesthesia, was placed in the  dorsal lithotomy position and prepped and draped in the usual fashion.  A posterior weighted and anterior Sims retractor placed.  The cervix was  grasped at the anterior lip with a single-tooth tenaculum.  A Hulka  uterine manipulator was placed.  The retractors were then removed from  the vagina and the Foley catheter was inserted and attention was next  turned to the abdomen.  The subumbilical area was infiltrated with 0.5%  Marcaine plain and a small subumbilical incision was made.  The Veress  needle was inserted into peritoneal space.  Proper placement was tested  by noting a negative aspirate and free flow of normal saline through the  Veress needle again with negative aspirate, then by noting the response  of a drop of saline placed at the hub of the Veress needle to negative  pressure as the abdominal wall was elevated.  Pneumoperitoneum was  created  with 3 liters of CO2 using the automatic insufflator.  A  disposable 10-11 mm trocar was then inserted and its proper placement  noted with the laparoscope.  The two 5 mm trocars were inserted in the  right and left lower quadrant infiltrating with Marcaine, making small  incisions and then inserting the trocars under direct visualization.  Inspection of the pelvis revealed the right ovary to have a small smooth  clear looking cyst approximately 1-2 cm.  The ovary was freely mobile as  was the tube.  The uterus had a small fibroid fundally approximately 1  cm but was freely mobile.  The posterior and anterior cul-de-sacs were  clear.  On the left side the ovary was not able to be visualized.  There  was bowel adherent over the ovary and the ovary itself was adherent to  the left pelvic side wall.  The procedure was begun by doing the right  salpingo-oophorectomy first.  The tripolar cautery was used to come  across the pedicle containing the tube and the utero-ovarian ligament  and then 0 Vicryl Endoloops were placed around the infundibulopelvic  pedicle.  Two Endoloops  were placed and then the specimen was cut free  and let drop in the cul-de-sac.  Photographic documentation was taken of  the pedicle.  It was hemostatic.  The ureter was seen peristalsing after  removal of the tube and ovary.  Attention was next turned to the left  side.  The adhesions of the bowel to the sidewall were taken down  sharply and the ovary was able to be viewed.  It was densely adherent to  the left pelvic sidewall and would not move when it was tried to elevate  the ovary with the grasper.  The tube could not even be visualized.  The  dissection was begun posteriorly trying to find a plane around the  posterior aspect of the ovary where it was adherent to the sidewall to  carefully and with small bites to free the ovary up posteriorly.  The  tripolar was used as well as monopolar scissors in order to try and  free  up that posterior margin of the ovary.  In the dissection the cyst on  the ovary was entered and exuded a moderate amount of chocolate brown  fluid.  This was also occurred on a separate cyst of the ovary.  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  The chocolate brown fluid was  irrigated away.  The dissection very carefully continued around the  posterior and then lateral sides of the ovary.  The round ligament was  scarred with a white area of puckered endometriosis, but it could be  identified and it was elevated with tripolar and cauterized.  That did  create a plane where the anterior peritoneum could be taken down around  the anterior surface of the ovary sharply.  The tripolar was used to  come across the infundibulopelvic ligament and the ovary was then  separated from the sidewall.  The tube still, however, was adherent to  the sidewall and was very gently and carefully dissected free from the  peritoneal adhesions and removed as a separate specimen.  There looked  like there was a very small amount of cyst wall that was left adherent  to the left pelvic sidewall after removal of specimens and it was felt  that it would be more risk to the patient to try and dissect that off  the sidewall than it would be to leave it in place.  The specimens were  removed through the umbilicus, grasping it with a grasper with teeth and  then pulling out the entire trocar with the specimen to remove it.  The  sleeves were then able to be reinserted with no difficulty through the  existing incision.  The pelvis was carefully inspected.  The pedicles  were hemostatic.  The left pelvic sidewall was left and still had some  adhesions and it was impossible to identify the ureter on that side  either at the pelvic brim or deep in the pelvis.  The decision was made  at that point to do a cystoscope to identify the ureter before finishing  the case.  The instruments removed from the abdomen.  The  trocar sleeves  were removed under direct visualization.  Pneumoperitoneum was allowed  to escape and the fascia was closed with a single figure-of-eight suture  of 0 Vicryl.  The skin was closed subcuticularly with 4-0 Vicryl Rapide.  Attention was turned to the cystoscopy.  A vial of indigo carmine had  been given during the laparoscopy.  The cystoscope was inserted and the  bladder filled with sterile water. The ureters could be identified on  both sides.  The ureter on the patient's left appeared to be wider and  more patulous than the ureteral opening on the patient's right.  The  indigo carmine was not seen coming through either ureter.  It was felt  that it had been given too long for the cystoscopy to still be present.  Therefore a second vial of indigo carmine was given and after about 8  minutes, indigo carmine could be seen clearly emanating from both  ureters.  Because of the patulence of the left ureteral opening, Dr.  Willow Ora was called in for intraoperative consultation.  He  examined the ureter on the left.  He put a stent all the way through it  for approximately 20 cm without any difficulty and his assessment was  that no further evaluation of that kidney or ureter was necessary.  The  scope was withdrawn from the bladder.  A posterior weighted and anterior  Sims retractor were then placed in the vagina.  The cervix was dilated  up to a number 25 Pratt.  Gentle sharp curettage was then carried out,  the specimen was sent to pathology.  The instruments were removed from  vagina and the procedure was terminated.  The patient tolerated it well  and went in satisfactory condition to post anesthesia recovery.      Cynthia P. Romine, M.D.  Electronically Signed     CPR/MEDQ  D:  12/07/2007  T:  12/08/2007  Job:  [id number]   cc:   Bertram Millard. Dahlstedt, M.D.  Fax: [phone]

## 2010-08-03 NOTE — Discharge Summary (Signed)
NAMEELOYCE, BULTMAN                   ACCOUNT NO.:  [id number]   MEDICAL RECORD NO.:  [id number]          PATIENT TYPE:  INP   LOCATION:  1601                         FACILITY:  [facility]   PHYSICIAN:  Ollen Gross, M.D.    DATE OF BIRTH:  05/25/1950   DATE OF ADMISSION:  07/06/2008  DATE OF DISCHARGE:  07/09/2008                               DISCHARGE SUMMARY   ADMISSION DIAGNOSES:  1. Osteoarthritis of the left hip.  2. Asthma.  3. Emphysema.  4. History of bronchitis.  5. History of pneumonia.  6. Hypertension.  7. Gastroesophageal reflux disease.  8. History of paroxysmal atrial fibrillation.  9. History of urinary tract infections.   DISCHARGE DIAGNOSES:  1. Osteoarthritis of the left hip status post left total hip      replacement arthroplasty.  2. Acute postoperative blood loss anemia.  3. Asthma.  4. Emphysema.  5. History of bronchitis.  6. History of pneumonia.  7. Hypertension.  8. Gastroesophageal reflux disease.  9. History of paroxysmal atrial fibrillation.  10.History of urinary tract infections.   PROCEDURE:  July 06, 2008, left total hip.   SURGEON:  Ollen Gross, M.D.   ASSISTANT:  Alexzandrew L. Perkins, P.A.C.   ANESTHESIA:  General.   CONSULTATIONS:  None.   BRIEF HISTORY:  Ms. Deuser is a 60 year old female with end-stage  arthritis of the left hip, progressive worsening pain and dysfunction,  failed operative management and now presents for total hip arthroplasty.   LABORATORY DATA:  Preop CBC showed hemoglobin 13.4, hematocrit 39.2,  white cell count 6.0, platelets 210.  Postop hemoglobin 11 and 9.6, last  H and H back up to 9.9 with hematocrit of 28.8.  PT/INR 12.5 and 0.9  with PTT of 27.  Serial pro-times followed per Coumadin protocol.  Last  PT/INR 22.4 and 1.9.  Chem panel on admission, minimally elevated  glucose of 124.  Remaining chem panel within normal limits.  Serial  BMETs were followed.  Electrolytes remained within normal  limits.  Preop  UA showed trace hemoglobin, 0-2 red cells, otherwise negative.  Blood  type A+.   DIAGNOSTICS:  1. EKG June 06, 2008:  Sinus rhythm unconfirmed.  2. Two-view chest June 28, 2008:  No infiltrate, congestive heart      failure, mild tortuous aorta, mild central pulmonary vascular      prominence unchanged.  3. Left hip film June 28, 2008:  Prominent left hip degenerative      changes.  4. Portable hip and pelvis film on July 06, 2008:  Left total hip      arthroplasty.  No adverse features identified.   HOSPITAL COURSE:  The patient was admitted to [facility],  taken to the OR and underwent above stated procedure without  complication.  The patient tolerated the procedure well, later  transferred to the recovery room on the orthopedic floor.  Started on  PCA and p.o. analgesic pain control following surgery.  Given 24 hours  postop IV antibiotics.  Started Coumadin for DVT prophylaxis.  The  patient was on  day 1 doing well, sitting up, started getting up with  therapy.  Blood pressure was a little soft and low.  Put her blood  pressure meds on parameters.  Started back on her reflux meds.  She had  excellent urinary output. Started getting up and actually walked over  200 feet on day 1 doing extremely well.  By day 2, she was doing well.  Hemoglobin was down to 9.6.  She was asymptomatic with this.  Dressing  was changed at that time and looked good.  Continued to progress well  physical therapy.  On postop day 3, July 09, 2008, she was doing well,  tolerating her meds and discharged home.   DISPOSITION:  The patient was discharged home on July 09, 2008.   DISCHARGE MEDICATIONS:  1. Percocet.  2. Robaxin.  3. Coumadin.   DIET:  Heart-healthy diet.   ACTIVITY:  Partial weightbearing 25-50% left leg, hip precautions, total  hip protocol.   FOLLOW UP:  Follow up in 2 weeks.   CONDITION ON DISCHARGE:  Improved.      Alexzandrew L. Perkins,  P.A.C.      Ollen Gross, M.D.  Electronically Signed    ALP/MEDQ  D:  07/21/2008  T:  07/21/2008  Job:  [id number]   cc:   Ollen Gross, M.D.  Fax: [phone]   W. Viann Fish, M.D.  Fax: [phone]  Email: [email or web address]   Claretta Fraise, MD

## 2010-08-03 NOTE — Cardiovascular Report (Signed)
NAMESHARLA, TANKARD NO.:  [id number]   MEDICAL RECORD NO.:  [id number]          PATIENT TYPE:  OIB   LOCATION:  1961                         FACILITY:  [facility]   PHYSICIAN:  Georga Hacking, M.D.DATE OF BIRTH:  1950-09-18   DATE OF PROCEDURE:  12/09/2005  DATE OF DISCHARGE:  12/09/2005                              CARDIAC CATHETERIZATION   HISTORY:  The patient is a 60 year old female with obesity and hypertension  who has had recurrent atrial fibrillation.  She had an atrial fibrillation  earlier this year but has had recurrent chest discomfort since then.  A  previous Cardiolite stress test was unremarkable for ischemia but because of  the presence of ongoing chest discomfort, catheterization was advised to  exclude coronary artery disease.   PROCEDURE:  Left heart catheterization with coronary angiograms and left  ventriculogram.   COMMENTS ABOUT PROCEDURE:  The procedure was done through the right femoral  artery through a single anterior needle wall stick with a 4-French sheath  being placed.  The procedure was done through 4-French catheters and a 30 mL  ventriculogram was performed.  She tolerated the procedure well.  There was  good hemostasis and peripheral pulses noted at the end of the procedure.   HEMODYNAMIC DATA:  Aorta post contrast 124/71, LV post contrast 124/8-16.   ANGIOGRAPHIC DATA:  Left ventriculogram:  Performed in the 30-degree RAO  projection.  The aortic valve is normal.  The mitral valve is normal.  The  left ventricle appears normal in size.  Estimated ejection fraction is 60%.  There is trace to 1+ mitral regurgitation noted which may be related to the  presence of PVCs.   Coronary arteries:  Arise and distribute normally.  No significant coronary  calcification is noted.  The left main coronary artery is normal.  The left  anterior descending is a large vessel and extends to the apex.  A moderate-  sized diagonal branch with  moderate tortuosity is noted.  Circumflex  coronary artery:  Supplies a large branching first marginal artery and then  a continuation branch that supplies a second marginal artery.  The vessel  appears free of disease.  Right coronary artery:  A large dominant vessel  with no significant disease.   IMPRESSION:  1. Normal left ventricular function.  2. No significant coronary artery disease identified.   RECOMMENDATIONS:  Continued medical therapy.      Georga Hacking, M.D.  Electronically Signed    WST/MEDQ  D:  12/09/2005  T:  12/10/2005  Job:  [id number]   cc:   Bethena Midget, M.D.

## 2010-08-03 NOTE — Consult Note (Signed)
Donna, Moon NO.:  [id number]   MEDICAL RECORD NO.:  [id number]          PATIENT TYPE:  EMS   LOCATION:  [facility]JO                         FACILITY:  [facility]   PHYSICIAN:  Georga Hacking, M.D.DATE OF BIRTH:  [birth year]   DATE OF CONSULTATION:  04/05/2005  DATE OF DISCHARGE:                                   CONSULTATION   HISTORY:  This 60 year old female has a history of recurrent paroxysmal  atrial fibrillation.  Despite being placed on therapeutic doses of  flecainide and Toprol, she has had continued paroxysmal atrial fibrillation.  She has had a negative Cardiolite stress test and an unremarkable echo.  She  is morbidly obese and has reflux and has chest pain whenever she has rapid  atrial fibrillation but is asymptomatic when she is in sinus rhythm.  She is  due to have a catheter ablation by Dr. Clydie Braun at [facility]  on February 12.  She has had atrial fibrillation on a fairly recurrent basis  and has been placed on Cardizem but was unable to tolerate this, and this  was discontinued.  She has had atrial fibrillation maybe every other day but  had a prolonged episode lasting 12 hours associated with chest pain last  night and went to her family doctor's office this morning for a routine  visit.  She was having some vague chest pain when she was there, was in  atrial fibrillation, and she was placed in an ambulance and sent to the  hospital.  She has converted to sinus rhythm and is not symptomatic at the  present time.  Initial point of care enzymes are unremarkable and EKG shows  no acute ischemia.   PHYSICAL EXAMINATION:  GENERAL:  She is a pleasant, obese female in no acute  distress.  VITAL SIGNS:  Blood pressure was 120/80, pulse was 60 and regular.  SKIN:  Warm and dry.  LUNGS:  Clear.  CARDIAC:  Normal S1, S2, no S3.   EKG shows sinus rhythm, no acute abnormality.  Point of care enzymes were  negative.   IMPRESSION:  1.   Recurrent paroxysmal atrial fibrillation with chest pain.  2.  Morbid obesity.   RECOMMENDATIONS:  The patient is currently in sinus rhythm and is  asymptomatic.  I have increased her Toprol to 100 mg twice daily and she  will be discharged home.  She is to be out of work the next week and follow  up with me next week.  Is to report recurrent pain.  She is to have catheter  ablation done fairly soon.  If she continues to have recurrent chest pain,  may consider catheterization.      Georga Hacking, M.D.  Electronically Signed     WST/MEDQ  D:  04/05/2005  T:  04/05/2005  Job:  [id number]

## 2010-08-07 LAB — HM PAP SMEAR

## 2010-12-17 LAB — CBC
HCT: 39.9
Hemoglobin: 13.6
MCHC: 34.2
MCV: 93.8
Platelets: 196
RDW: 13.2

## 2010-12-27 HISTORY — PX: TOTAL HIP ARTHROPLASTY: SHX124

## 2011-04-04 IMAGING — CR DG HIP (WITH OR WITHOUT PELVIS) 2-3V*L*
3 series · 3 of 3 positions shown · non-contrast
Comparison: 07/06/2008

CLINICAL DATA: Preop left hip

LEFT HIP - COMPLETE 2+ VIEW

[t pelvis a.p.]
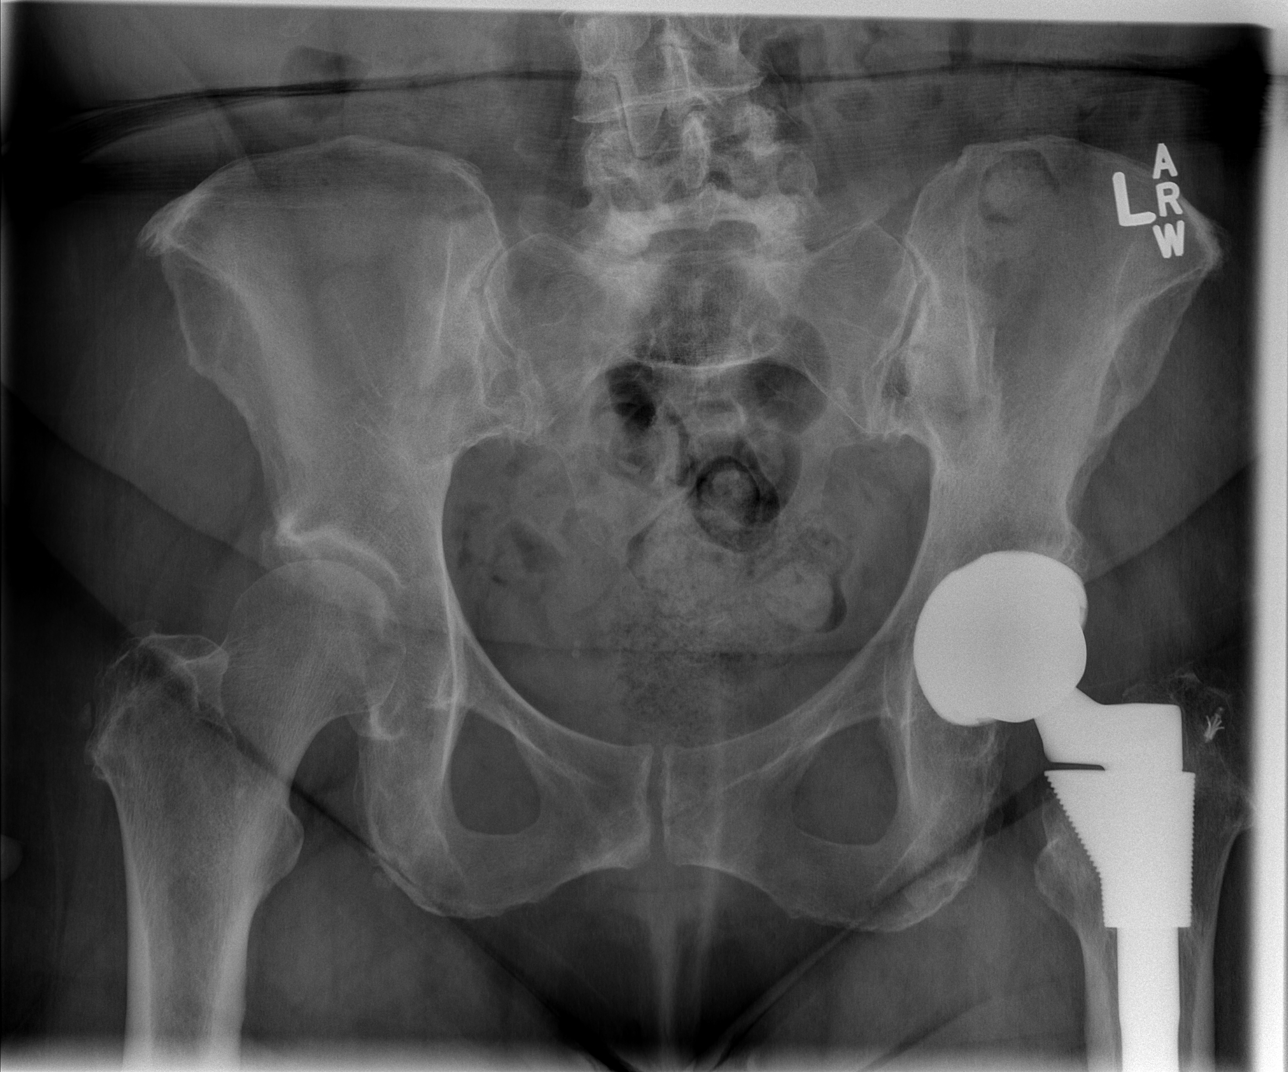

[t hip ap left]
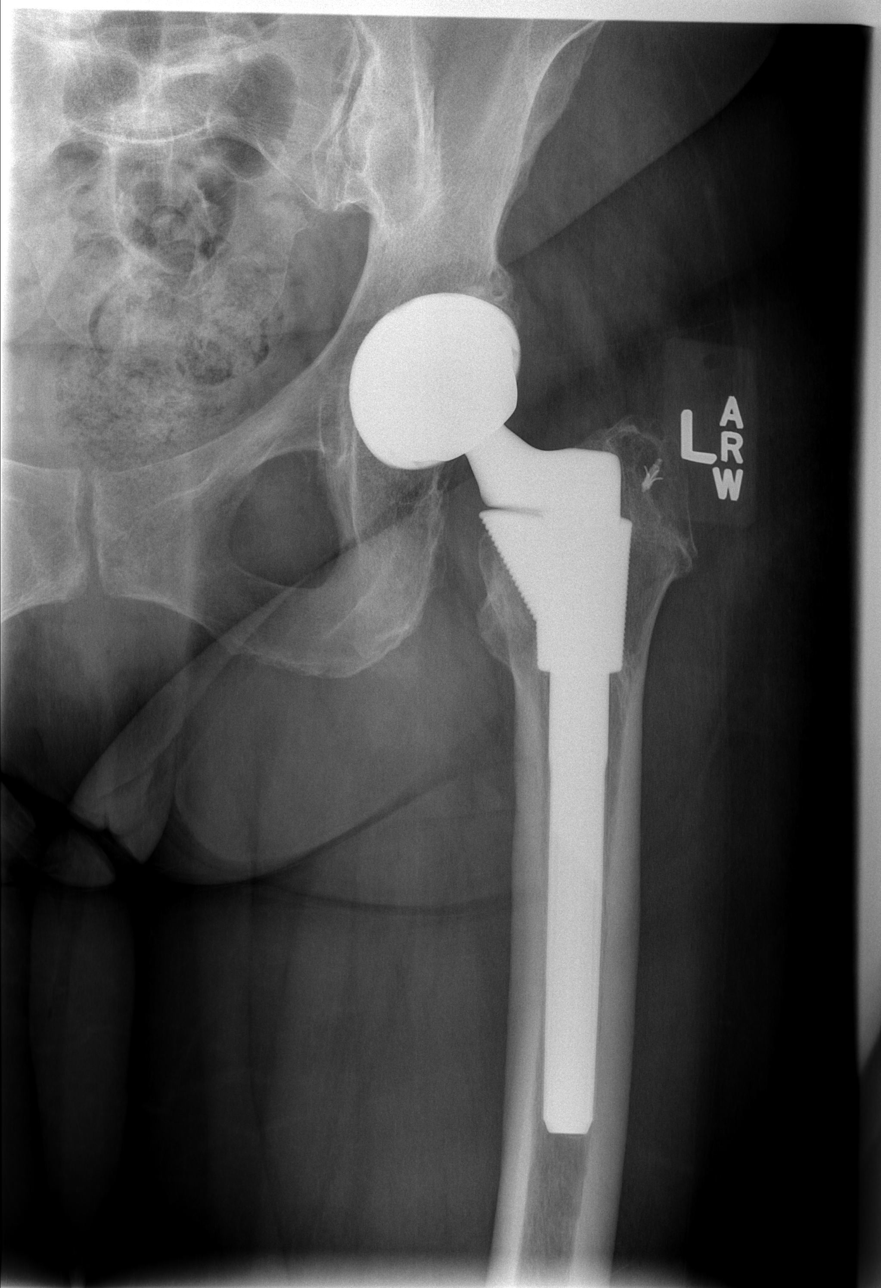

[t hip frog leg left]
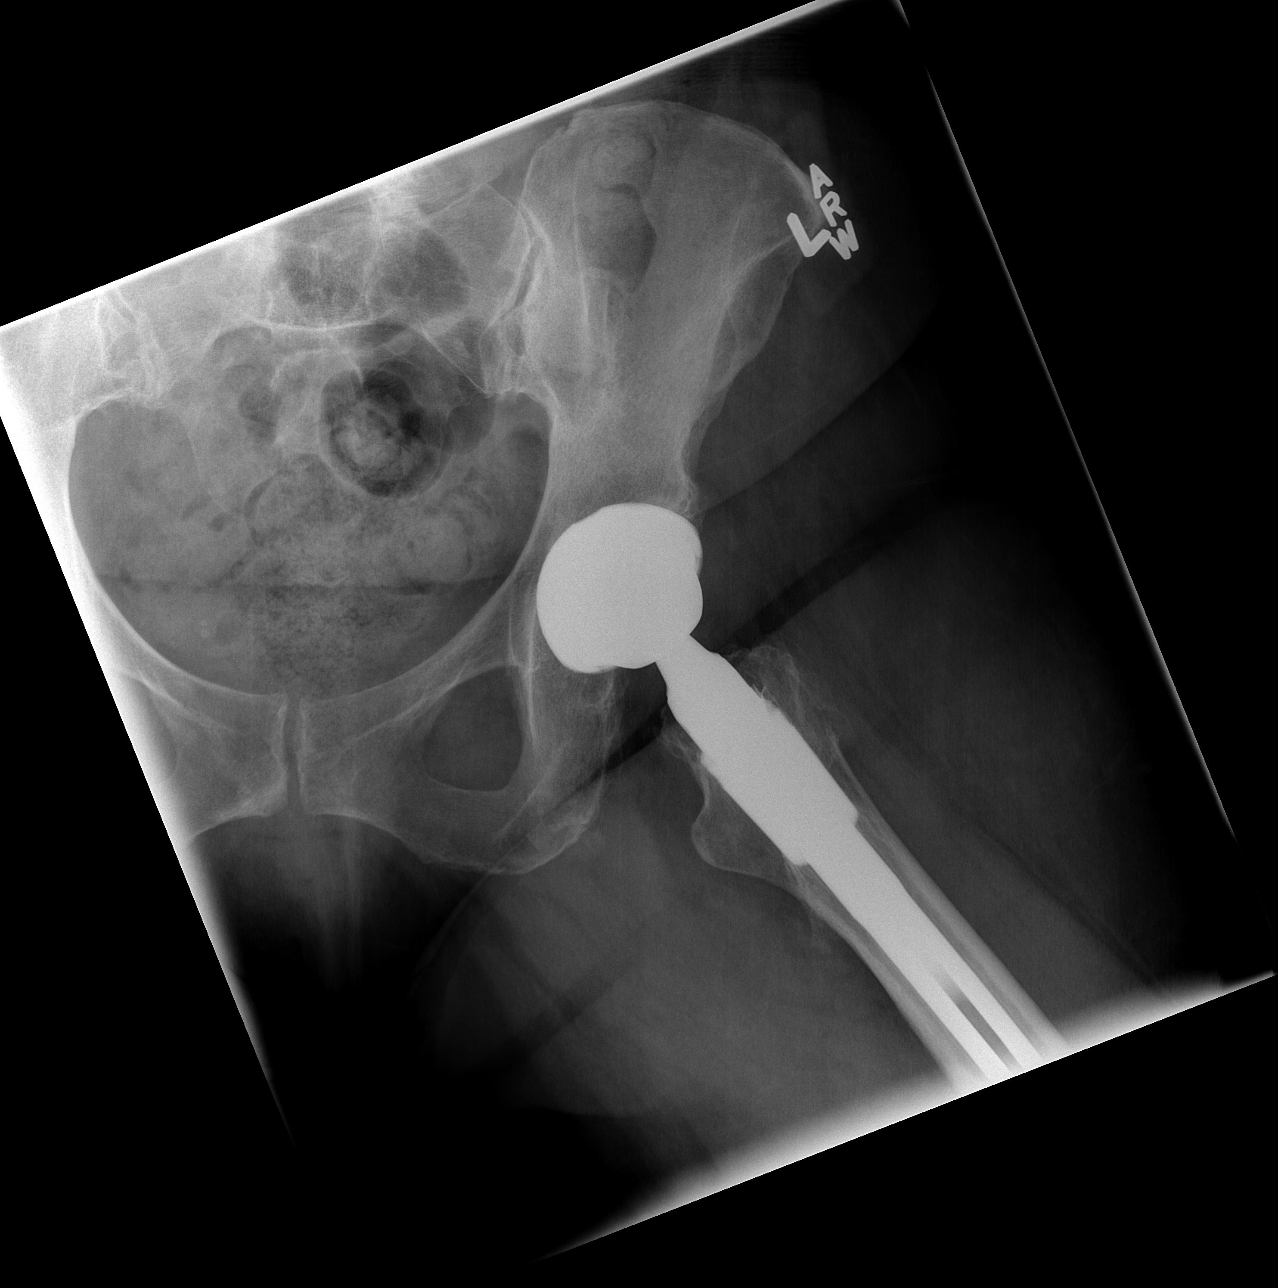

[3 of 3 positions shown; findings below may reference images not displayed]

FINDINGS: Total hip replacement on the left in satisfactory
position and  alignment.  No fracture or complication.  Metal
anchor is seen in the greater trochanter.
IMPRESSION: Satisfactory left hip replacement.  No acute abnormality.

## 2012-09-13 ENCOUNTER — Encounter: Payer: Self-pay | Admitting: *Deleted

## 2012-09-14 ENCOUNTER — Encounter: Payer: Self-pay | Admitting: Cardiovascular Disease

## 2012-09-30 ENCOUNTER — Encounter: Payer: Self-pay | Admitting: Cardiovascular Disease

## 2012-09-30 ENCOUNTER — Ambulatory Visit (INDEPENDENT_AMBULATORY_CARE_PROVIDER_SITE_OTHER): Payer: BC Managed Care – PPO | Admitting: Cardiovascular Disease

## 2012-09-30 VITALS — BP 130/80 | HR 49 | Resp 16 | Ht 68.0 in | Wt 210.0 lb

## 2012-09-30 DIAGNOSIS — E669 Obesity, unspecified: Secondary | ICD-10-CM

## 2012-09-30 DIAGNOSIS — I480 Paroxysmal atrial fibrillation: Secondary | ICD-10-CM

## 2012-09-30 DIAGNOSIS — I951 Orthostatic hypotension: Secondary | ICD-10-CM

## 2012-09-30 DIAGNOSIS — I4891 Unspecified atrial fibrillation: Secondary | ICD-10-CM

## 2012-09-30 MED ORDER — METOPROLOL TARTRATE 25 MG PO TABS: ORAL_TABLET | ORAL | Status: DC

## 2012-09-30 NOTE — Progress Notes (Signed)
Patient ID: Donna Moon, female   DOB: 1950-06-11, 62 y.o.   MRN: [id number]     Reason for office visit Atrial fibrillation  Donna Moon has generally done well since her last appointment but is troubled by occasional palpitations and orthostatic dizziness. Overall she feels much improved since restarting treatment with propafenone. Her palpitations have become much less frequent. When they occur they usually only last for a few minutes. They do not associate dizziness, lightheadedness, syncope, chest pain or shortness of breath. When she was going through a lot of psychological stress she had a difficult night where she couldn't sleep at all because of palpitations but by the next morning her symptoms had resolved. Says only happened once in the last 6 months. She often becomes dizzy when moving from a sitting to standing position. Typically this happens when she returns home after a long drive. She has no problems when she initially gets out of the car but becomes very dizzy by the time she reaches her front porch. On the other hand [facility] and [facility] without any dizziness. She notes that the fastest heart rate ever gets is the high 80 beats per minute range.    Allergies  Allergen Reactions  . Lexapro (Escitalopram Oxalate)     Flu like symptoms     Current Outpatient Prescriptions  Medication Sig Dispense Refill  . albuterol (PROVENTIL HFA;VENTOLIN HFA) 108 (90 BASE) MCG/ACT inhaler Inhale 2 puffs into the lungs every 6 (six) hours as needed for wheezing.      Marland Kitchen aspirin 325 MG tablet Take 325 mg by mouth daily.      . fluticasone (FLONASE) 50 MCG/ACT nasal spray Place 2 sprays into the nose daily.      Marland Kitchen ibuprofen (ADVIL,MOTRIN) 800 MG tablet Take 800 mg by mouth every 8 (eight) hours as needed for pain.      Marland Kitchen lansoprazole (PREVACID) 30 MG capsule Take 30 mg by mouth daily.      . metoprolol (LOPRESSOR) 25 MG tablet Take one tablet (25 mg)  in AM and 2 tablets (50 mg) in PM  270 tablet  3  . propafenone (RYTHMOL) 150 MG tablet Take 150 mg by mouth every 8 (eight) hours.       No current facility-administered medications for this visit.    Past Medical History  Diagnosis Date  . Atrial fibrillation   . Dizziness   . Obesity     Past Surgical History  Procedure Laterality Date  . Carpel tunnel  1993  . Appendectomy  1968  . Oophorectomy  2009  . Cardiac electrophysiology study and ablation  1997 & 2011  . Cesarean section  1986  . Total hip arthroplasty  12/27/2010  . Rotator cuff repair  1999    Family History  Problem Relation Age of Onset  . Stroke Mother   . Heart attack Father   . Heart attack Brother   . Diabetes Sister   . Diabetes Sister     History   Social History  . Marital Status: Married    Spouse Name: N/A    Number of Children: N/A  . Years of Education: N/A   Occupational History  . Not on file.   Social History Main Topics  . Smoking status: Former Games developer  . Smokeless tobacco: Former Neurosurgeon    Quit date: 03/17/1988  . Alcohol Use: No  . Drug Use: No  . Sexually  Active: Not on file   Other Topics Concern  . Not on file   Social History Narrative  . No narrative on file    Review of systems: The patient specifically denies any chest pain at rest or with exertion, dyspnea at rest or with exertion, orthopnea, paroxysmal nocturnal dyspnea, syncope,  focal neurological deficits, intermittent claudication, lower extremity edema, unexplained weight gain, cough, hemoptysis or wheezing.  The patient also denies abdominal pain, nausea, vomiting, dysphagia, diarrhea, constipation, polyuria, polydipsia, dysuria, hematuria, frequency, urgency, abnormal bleeding or bruising, fever, chills, unexpected weight changes, mood swings, change in skin or hair texture, change in voice quality, auditory or visual problems, allergic reactions or rashes, new musculoskeletal complaints other than usual  "aches and pains".   PHYSICAL EXAM BP 130/80  Pulse 49  Resp 16  Ht 5\' 8"  (1.727 m)  Wt 210 lb (95.255 kg)  BMI 31.94 kg/m2  General: Alert, oriented x3, no distress Head: no evidence of trauma, PERRL, EOMI, no exophtalmos or lid lag, no myxedema, no xanthelasma; normal ears, nose and oropharynx Neck: normal jugular venous pulsations and no hepatojugular reflux; brisk carotid pulses without delay and no carotid bruits Chest: clear to auscultation, no signs of consolidation by percussion or palpation, normal fremitus, symmetrical and full respiratory excursions Cardiovascular: normal position and quality of the apical impulse, regular rhythm, normal first and second heart sounds, no murmurs, rubs or gallops Abdomen: no tenderness or distention, no masses by palpation, no abnormal pulsatility or arterial bruits, normal bowel sounds, no hepatosplenomegaly Extremities: no clubbing, cyanosis or edema; 2+ radial, ulnar and brachial pulses bilaterally; 2+ right femoral, posterior tibial and dorsalis pedis pulses; 2+ left femoral, posterior tibial and dorsalis pedis pulses; no subclavian or femoral bruits Neurological: grossly nonfocal   EKG: Sinus bradycardia, early RS transition in lead V2 otherwise normal. QTC 420 ms   BMET    Component Value Date/Time   NA 136 06/08/2010 0451   K 4.0 06/08/2010 0451   CL 102 06/08/2010 0451   CO2 27 06/08/2010 0451   GLUCOSE 132* 06/08/2010 0451   BUN 9 06/08/2010 0451   CREATININE 0.77 06/08/2010 0451   CALCIUM 8.7 06/08/2010 0451   GFRNONAA >60 06/08/2010 0451   GFRAA  Value: >60        The eGFR has been calculated using the MDRD equation. This calculation has not been validated in all clinical situations. eGFR's persistently <60 mL/min signify possible Chronic Kidney Disease. 06/08/2010 0451     ASSESSMENT AND PLAN Paroxysmal atrial fibrillation She has sporadic palpitations consistent with her previous symptoms of atrial fibrillation we have not  documented diabetes. I had recommended she wear an event monitor, which she canceled it since she had to pay the first $1000 out of pocket. She has had good clinical response to propafenone and beta blocker therapy with very rare breakthrough events. On the other hand she has fairly significant orthostatic hypotension. I have asked her to reduce the morning dose of metoprolol tartrate to 25 mg, didn't take 50 mg in the evening. I would like to revisit the event monitor later this year once she mean her deductible with her insurance (she has a planned knee surgery in September). She has a low chance of fast score and is on aspirin for stroke prevention.  Obesity (BMI 30-39.9) Portion she has not made much progress with weight loss. Hopefully reducing the dose of beta blocker allowed for more intense physical activity. Caloric restriction is recommended. Also recommend that she  stay well hydrated to minimize the occurrence of orthostatic hypotension.  Orthostatic hypotension  .   Orders Placed This Encounter  Procedures  . EKG 12-Lead   Meds ordered this encounter  Medications  . metoprolol (LOPRESSOR) 25 MG tablet    Sig: Take one tablet (25 mg) in AM and 2 tablets (50 mg) in PM    Dispense:  270 tablet    Refill:  3    Darl Brisbin  Thurmon Fair, MD, [facility] [phone] office [phone] pager

## 2012-09-30 NOTE — Assessment & Plan Note (Signed)
Portion she has not made much progress with weight loss. Hopefully reducing the dose of beta blocker allowed for more intense physical activity. Caloric restriction is recommended. Also recommend that she stay well hydrated to minimize the occurrence of orthostatic hypotension.

## 2012-09-30 NOTE — Assessment & Plan Note (Signed)
She has sporadic palpitations consistent with her previous symptoms of atrial fibrillation we have not documented diabetes. I had recommended she wear an event monitor, which she canceled it since she had to pay the first $1000 out of pocket. She has had good clinical response to propafenone and beta blocker therapy with very rare breakthrough events. On the other hand she has fairly significant orthostatic hypotension. I have asked her to reduce the morning dose of metoprolol tartrate to 25 mg, didn't take 50 mg in the evening. I would like to revisit the event monitor later this year once she mean her deductible with her insurance (she has a planned knee surgery in September). She has a low chance of fast score and is on aspirin for stroke prevention.

## 2012-09-30 NOTE — Patient Instructions (Addendum)
Change Metoprolol to 25mg  (1 tablet) in the morning and 50mg  (2 tablets in the evening).  Your physician recommends that you schedule a follow-up appointment in: 4 months.

## 2012-10-14 ENCOUNTER — Other Ambulatory Visit: Payer: Self-pay | Admitting: *Deleted

## 2012-10-14 MED ORDER — PROPAFENONE HCL 150 MG PO TABS: 150.0000 mg | ORAL_TABLET | Freq: Three times a day (TID) | ORAL | Status: DC

## 2012-10-14 NOTE — Telephone Encounter (Signed)
Rx was sent to pharmacy electronically. 

## 2012-11-24 ENCOUNTER — Other Ambulatory Visit: Payer: Self-pay | Admitting: Orthopedic Surgery

## 2012-11-24 NOTE — Progress Notes (Signed)
Preoperative surgical orders have been place into the Epic hospital system for Donna Moon on 11/24/2012, 5:18 PM  by Patrica Duel for surgery on 12/07/2012.  Preop Total Knee orders including Experal, PO Tylenol, and IV Decadron as long as there are no contraindications to the above medications. Avel Peace, PA-C

## 2012-11-25 ENCOUNTER — Encounter (HOSPITAL_COMMUNITY): Payer: Self-pay | Admitting: Pharmacy Technician

## 2012-11-30 ENCOUNTER — Encounter (HOSPITAL_COMMUNITY)
Admission: RE | Admit: 2012-11-30 | Discharge: 2012-11-30 | Disposition: A | Discharge status: Home / Self Care | Payer: Medicare Other | Source: Ambulatory Visit | Attending: Orthopedic Surgery | Admitting: Orthopedic Surgery

## 2012-11-30 ENCOUNTER — Encounter (HOSPITAL_COMMUNITY): Payer: Self-pay

## 2012-11-30 DIAGNOSIS — Z01812 Encounter for preprocedural laboratory examination: Secondary | ICD-10-CM | POA: Insufficient documentation

## 2012-11-30 DIAGNOSIS — Z01818 Encounter for other preprocedural examination: Secondary | ICD-10-CM | POA: Insufficient documentation

## 2012-11-30 HISTORY — DX: Allergy status to unspecified drugs, medicaments and biological substances: Z88.9

## 2012-11-30 HISTORY — DX: Nausea with vomiting, unspecified: R11.2

## 2012-11-30 HISTORY — DX: Unspecified asthma, uncomplicated: J45.909

## 2012-11-30 HISTORY — DX: Other specified postprocedural states: Z98.890

## 2012-11-30 LAB — CBC
HCT: 39.5 % (ref 36.0–46.0)
MCH: 31.5 pg (ref 26.0–34.0)
MCV: 93.6 fL (ref 78.0–100.0)
RBC: 4.22 MIL/uL (ref 3.87–5.11)
WBC: 7.1 10*3/uL (ref 4.0–10.5)

## 2012-11-30 LAB — URINALYSIS, ROUTINE W REFLEX MICROSCOPIC
Bilirubin Urine: NEGATIVE
Glucose, UA: NEGATIVE mg/dL
Ketones, ur: NEGATIVE mg/dL
Leukocytes, UA: NEGATIVE
pH: 6 (ref 5.0–8.0)

## 2012-11-30 LAB — COMPREHENSIVE METABOLIC PANEL
BUN: 15 mg/dL (ref 6–23)
CO2: 28 mEq/L (ref 19–32)
Calcium: 9.6 mg/dL (ref 8.4–10.5)
Chloride: 105 mEq/L (ref 96–112)
Creatinine, Ser: 1.11 mg/dL — ABNORMAL HIGH (ref 0.50–1.10)
GFR calc Af Amer: 60 mL/min — ABNORMAL LOW (ref >90)
GFR calc non Af Amer: 52 mL/min — ABNORMAL LOW (ref >90)
Total Bilirubin: 0.3 mg/dL (ref 0.3–1.2)

## 2012-11-30 LAB — URINE MICROSCOPIC-ADD ON

## 2012-11-30 LAB — PROTIME-INR
INR: 0.92 (ref 0.00–1.49)
Prothrombin Time: 12.2 seconds (ref 11.6–15.2)

## 2012-11-30 NOTE — Pre-Procedure Instructions (Addendum)
11-30-12 EKG 7'14. 12-01-12 1530 Patient and Dr. Lequita Halt notified of Positive MRSA PCR screen-will need tx. Mupirocin Onitment and require Contact Isolation.W. Kennon Portela

## 2012-11-30 NOTE — Patient Instructions (Addendum)
20 Donna Moon  11/30/2012   Your procedure is scheduled on:   -12-07-2012  Report to [facility] at     0600   AM.  Call this number if you have problems the morning of surgery: [phone]  Or Presurgical Testing [phone](Haniah Penny)      Do not eat food:After Midnight.    Take these medicines the morning of surgery with A SIP OF WATER: Allegra. Prevacid. Metoprolol. Propafenone.Flonase/use+bring. Albuterol/bring. Stop Aspirin x 5 days prior.   Do not wear jewelry, make-up or nail polish.  Do not wear lotions, powders, or perfumes. You may wear deodorant.  Do not shave 12 hours prior to first CHG shower(legs and under arms).(face and neck okay.)  Do not bring valuables to the hospital.  Contacts, dentures or bridgework,body piercing,  may not be worn into surgery.  Leave suitcase in the car. After surgery it may be brought to your room.  For patients admitted to the hospital, checkout time is 11:00 AM the day of discharge.   Patients discharged the day of surgery will not be allowed to drive home. Must have responsible person with you x 24 hours once discharged.  Name and phone number of your driver: Raiya Stainback -spouse [phone] cell  Special Instructions: CHG(Chlorhedine 4%-"Hibiclens","Betasept","Aplicare") Shower Use Special Wash: see special instructions.(avoid face and genitals)   Please read over the following fact sheets that you were given: MRSA Information, Blood Transfusion fact sheet, Incentive Spirometry Instruction.    Failure to follow these instructions may result in Cancellation of your surgery.   Patient signature_______________________________________________________

## 2012-12-01 NOTE — Progress Notes (Signed)
[phone] Patient screened Positive for MRSA with PCR screen, patient notified of and the need for Contact Isolation.

## 2012-12-03 ENCOUNTER — Telehealth: Payer: Self-pay | Admitting: Cardiovascular Disease

## 2012-12-03 NOTE — Telephone Encounter (Signed)
Toniann Fail will fax form for Dr. Salena Saner to sign.

## 2012-12-03 NOTE — Telephone Encounter (Signed)
Has not received the pre op clearance for Donna Moon yet.  She is scheduled for knee replacement on Monday 12/07/12.  Please call Toniann Fail [phone].  Fax #  [phone]. Toniann Fail states that form has been faxed for Dr. Royann Shivers to sign.

## 2012-12-04 ENCOUNTER — Telehealth: Payer: Self-pay | Admitting: *Deleted

## 2012-12-04 NOTE — Telephone Encounter (Signed)
Cardiac clearance faxed to [facility] 

## 2012-12-04 NOTE — Pre-Procedure Instructions (Signed)
CARDIAC CLEARANCE ON PT'S CHART FROM DR. CROITORU.

## 2012-12-06 ENCOUNTER — Other Ambulatory Visit: Payer: Self-pay | Admitting: Orthopedic Surgery

## 2012-12-06 NOTE — H&P (Signed)
Donna Moon  DOB: June 05, 1950 Single / Language: [place] / Race: White Female  Date of Admission:  12/07/2012  Chief Complaint:  Left Knee Pain  History of Present Illness The patient is a 62 year old female who comes in for a preoperative History and Physical. The patient is scheduled for a left total knee arthroplasty to be performed by Dr. Gus Rankin. Aluisio, MD at [facility] on 12/07/2012 The patient is a 62 year old female who presents today for follow up of their knee. The patient is being followed for their left knee pain. They are now several week(s) out from her last cortisone injection. Symptoms reported today include: pain (burning). Note for "Follow-up Knee": Patient states that she has noticed the pain again in the last month. She said that she has been limping a lot because of her left knee. Because of this she has now developed increased lateral hip pain on the left. She is not having any groin pain. She is not having lower extremity weakness or paresthesia. The left knee is definitely limiting her more and more. She is ready to go ahead and get this fixed. They have been treated conservatively in the past for the above stated problem and despite conservative measures, they continue to have progressive pain and severe functional limitations and dysfunction. They have failed non-operative management including home exercise, medications, and injections. It is felt that they would benefit from undergoing total joint replacement. Risks and benefits of the procedure have been discussed with the patient and they elect to proceed with surgery. There are no active contraindications to surgery such as ongoing infection or rapidly progressive neurological disease.   Problem List S/P total hip arthroplasty (V43.64) Primary osteoarthritis of one knee (715.16)    Allergies Naprosyn *ANALGESICS - ANTI-INFLAMMATORY* Lexapro *ANTIDEPRESSANTS*   Family History Heart  disease in female family member before age 18 Congestive Heart Failure. mother Rheumatoid Arthritis. sister Heart disease in female family member before age 47 Osteoarthritis. mother and sister Kidney disease. mother, sister and child Cerebrovascular Accident. mother Cancer. mother and father mother, father and brother Hypertension. sister Heart Disease. mother and father Diabetes Mellitus. sister and grandmother fathers side grandmother mothers side   Social History Exercise. Exercises weekly; does individual sport, other, gym / weights and team sport Exercises weekly; does gym / weights Drug/Alcohol Rehab (Previously). no Drug/Alcohol Rehab (Currently). no Marital status. married Living situation. live with spouse Illicit drug use. no Current work status. retired Copywriter, advertising. 1 Alcohol use. never consumed alcohol Tobacco / smoke exposure. no Pain Contract. no Number of flights of stairs before winded. less than 1 2-3 Tobacco use. Former smoker. never smoker former smoker Previously in rehab. no Post-Surgical Plans. She wants to look into [facility] after her surgery.   Medication History Propafenone HCl (150MG  Tablet, Oral) Active. Metoprolol Succinate (25MG  Tablet ER 24HR, Oral) Inactive. Prevacid (30MG  Capsule DR, Oral) Inactive. Aspirin EC (325MG  Tablet DR, Oral) Inactive. Flonase (50MCG/ACT Suspension, Nasal) Inactive. Calcium Carbonate (600MG  Tablet, Oral) Active. TraMADol HCl (50MG  Tablet, 1-2 Tablet Oral twice daily, Taken 03/05/2011 to 03/20/2011) Inactive. Ibuprofen 200 (200MG  Tablet, Oral) Inactive.  Past Surgical History Arthroscopy of Shoulder. right Appendectomy Rotator Cuff Repair. right Dilation and Curettage of Uterus Carpal Tunnel Repair. bilateral Cesarean Delivery. 1 time Total Hip Replacement. left Cardiac Ablation Procedures. 2006, 20011 Cardiac Catherterization  Medical  History Osteoporosis Osteoarthritis Heart murmur Fibromyalgia Asthma Gastroesophageal Reflux Disease Cardiac Arrhythmia Atrial Fibrillation. Paroxsymal Autoimmune disorder. mother sister   Review  of Systems General:Not Present- Chills, Fever, Night Sweats, Fatigue, Weight Gain, Weight Loss and Memory Loss. Skin:Not Present- Hives, Itching, Rash, Eczema and Lesions. HEENT:Not Present- Tinnitus, Headache, Double Vision, Visual Loss, Hearing Loss and Dentures. Respiratory:Not Present- Shortness of breath with exertion, Shortness of breath at rest, Allergies, Coughing up blood and Chronic Cough. Cardiovascular:Present- Palpitations. Not Present- Chest Pain, Racing/skipping heartbeats, Difficulty Breathing Lying Down, Murmur and Swelling. Gastrointestinal:Not Present- Bloody Stool, Heartburn, Abdominal Pain, Vomiting, Nausea, Constipation, Diarrhea, Difficulty Swallowing, Jaundice and Loss of appetitie. Female Genitourinary:Not Present- Blood in Urine, Urinary frequency, Weak urinary stream, Discharge, Flank Pain, Incontinence, Painful Urination, Urgency, Urinary Retention and Urinating at Night. Musculoskeletal:Present- Joint Pain. Not Present- Muscle Weakness, Muscle Pain, Joint Swelling, Back Pain, Morning Stiffness and Spasms. Neurological:Not Present- Tremor, Dizziness, Blackout spells, Paralysis, Difficulty with balance and Weakness. Psychiatric:Not Present- Insomnia.   Vitals Weight: 215 lb Height: 68 in Body Surface Area: 2.16 m Body Mass Index: 32.69 kg/m Pulse: 56 (Regular) Resp.: 12 (Unlabored) BP: 116/68 (Sitting, Left Arm, Standard)    Physical Exam(Alexzandrew L Perkins, III PA-C; 11/10/2012 11:31 AM) The physical exam findings are as follows:   General Mental Status - Alert, cooperative and good historian. General Appearance- pleasant. Not in acute distress. Orientation- Oriented X3. Build & Nutrition- Well nourished and Well  developed.   Head and Neck Head- normocephalic, atraumatic . Neck Global Assessment- supple. no bruit auscultated on the right and no bruit auscultated on the left.   Eye Pupil- Bilateral- Regular and Round. Motion- Bilateral- EOMI.   Chest and Lung Exam Auscultation: Breath sounds:- clear at anterior chest wall and - clear at posterior chest wall. Adventitious sounds:- No Adventitious sounds.   Cardiovascular Auscultation:Rhythm- Regular rate and rhythm. Heart Sounds- S1 WNL and S2 WNL. Murmurs & Other Heart Sounds:Auscultation of the heart reveals - No Murmurs.   Abdomen Palpation/Percussion:Tenderness- Abdomen is non-tender to palpation. Rigidity (guarding)- Abdomen is soft. Auscultation:Auscultation of the abdomen reveals - Bowel sounds normal.   Female Genitourinary Not done, not pertinent to present illness  Musculoskeletal On exam she is alert and oriented in no apparent distress. Her left hip could be flexed to 110, rotated in 30, out 40, abducted 40 without pain on range of motion of the hip. She is very tender over the greater trochanter. Her left knee shows no effusion. There is moderate crepitus on range of motion in the knee with slight varus deformity. Range 5 to 125. Very tender medially. No lateral tenderness or instability.  RADIOGRAPHS: Radiographs show she has bone on bone medial and patellofemoral in that left knee. She has had multiple radiographs of that left hip and the prosthesis has remained in good position with no abnormalities.  Assessment & Plan Primary osteoarthritis of one knee (715.16) Impression: Left Knee  Note: Plan is for a Left Total Knee Replacement by Dr. Lequita Halt.  Plan is to go to rehab.  Heart - Dr. Royann Shivers  The patient does not have any contraindications and will recieve TXA (tranexamic acid) prior to surgery.  Signed electronically by Lauraine Rinne, III PA-C

## 2012-12-07 ENCOUNTER — Inpatient Hospital Stay (HOSPITAL_COMMUNITY): Payer: Medicare Other | Admitting: Anesthesiology

## 2012-12-07 ENCOUNTER — Encounter (HOSPITAL_COMMUNITY): Payer: Self-pay | Admitting: Anesthesiology

## 2012-12-07 ENCOUNTER — Encounter (HOSPITAL_COMMUNITY)
Admission: RE | Disposition: A | Discharge status: Skilled Nursing Facility | Payer: Self-pay | Source: Ambulatory Visit | Attending: Orthopedic Surgery

## 2012-12-07 ENCOUNTER — Encounter (HOSPITAL_COMMUNITY): Payer: Self-pay | Admitting: *Deleted

## 2012-12-07 ENCOUNTER — Inpatient Hospital Stay (HOSPITAL_COMMUNITY)
Admission: RE | Admit: 2012-12-07 | Discharge: 2012-12-10 | DRG: 470 | Disposition: A | Discharge status: Skilled Nursing Facility | Payer: Medicare Other | Source: Ambulatory Visit | Attending: Orthopedic Surgery | Admitting: Orthopedic Surgery

## 2012-12-07 DIAGNOSIS — M1710 Unilateral primary osteoarthritis, unspecified knee: Principal | ICD-10-CM | POA: Diagnosis present

## 2012-12-07 DIAGNOSIS — I4891 Unspecified atrial fibrillation: Secondary | ICD-10-CM | POA: Diagnosis present

## 2012-12-07 DIAGNOSIS — M179 Osteoarthritis of knee, unspecified: Secondary | ICD-10-CM | POA: Diagnosis present

## 2012-12-07 DIAGNOSIS — E669 Obesity, unspecified: Secondary | ICD-10-CM | POA: Diagnosis present

## 2012-12-07 DIAGNOSIS — Z96649 Presence of unspecified artificial hip joint: Secondary | ICD-10-CM

## 2012-12-07 DIAGNOSIS — K219 Gastro-esophageal reflux disease without esophagitis: Secondary | ICD-10-CM | POA: Diagnosis present

## 2012-12-07 DIAGNOSIS — IMO0001 Reserved for inherently not codable concepts without codable children: Secondary | ICD-10-CM | POA: Diagnosis present

## 2012-12-07 DIAGNOSIS — Z6833 Body mass index (BMI) 33.0-33.9, adult: Secondary | ICD-10-CM

## 2012-12-07 DIAGNOSIS — Z96652 Presence of left artificial knee joint: Secondary | ICD-10-CM

## 2012-12-07 DIAGNOSIS — J45909 Unspecified asthma, uncomplicated: Secondary | ICD-10-CM | POA: Diagnosis present

## 2012-12-07 DIAGNOSIS — D62 Acute posthemorrhagic anemia: Secondary | ICD-10-CM | POA: Diagnosis not present

## 2012-12-07 HISTORY — PX: TOTAL KNEE ARTHROPLASTY: SHX125

## 2012-12-07 LAB — TYPE AND SCREEN: ABO/RH(D): A POS

## 2012-12-07 SURGERY — ARTHROPLASTY, KNEE, TOTAL
Anesthesia: General | Site: Knee | Laterality: Left | Wound class: Clean

## 2012-12-07 MED ORDER — PHENOL 1.4 % MT LIQD: 1.0000 | OROMUCOSAL | Status: DC | PRN

## 2012-12-07 MED ORDER — CEFAZOLIN SODIUM 1-5 GM-% IV SOLN
1.0000 g | Freq: Four times a day (QID) | INTRAVENOUS | Status: AC
  Administered 2012-12-07 (×2): 1 g via INTRAVENOUS
  Filled 2012-12-07 (×2): qty 50

## 2012-12-07 MED ORDER — DEXAMETHASONE SODIUM PHOSPHATE 10 MG/ML IJ SOLN
10.0000 mg | Freq: Once | INTRAMUSCULAR | Status: AC
  Administered 2012-12-07: 10 mg via INTRAVENOUS

## 2012-12-07 MED ORDER — LORATADINE 10 MG PO TABS
10.0000 mg | ORAL_TABLET | Freq: Every day | ORAL | Status: DC
  Administered 2012-12-08 – 2012-12-10 (×3): 10 mg via ORAL
  Filled 2012-12-07 (×3): qty 1

## 2012-12-07 MED ORDER — TRANEXAMIC ACID 100 MG/ML IV SOLN
1000.0000 mg | INTRAVENOUS | Status: AC
  Administered 2012-12-07: 1000 mg via INTRAVENOUS
  Filled 2012-12-07: qty 10

## 2012-12-07 MED ORDER — HYDROMORPHONE HCL PF 1 MG/ML IJ SOLN
0.2500 mg | INTRAMUSCULAR | Status: DC | PRN
  Administered 2012-12-07 (×2): 0.5 mg via INTRAVENOUS

## 2012-12-07 MED ORDER — PROMETHAZINE HCL 25 MG/ML IJ SOLN: 6.2500 mg | INTRAMUSCULAR | Status: DC | PRN

## 2012-12-07 MED ORDER — TRAMADOL HCL 50 MG PO TABS: 50.0000 mg | ORAL_TABLET | Freq: Four times a day (QID) | ORAL | Status: DC | PRN

## 2012-12-07 MED ORDER — FENTANYL CITRATE 0.05 MG/ML IJ SOLN
INTRAMUSCULAR | Status: DC | PRN
  Administered 2012-12-07: 50 ug via INTRAVENOUS
  Administered 2012-12-07 (×2): 100 ug via INTRAVENOUS

## 2012-12-07 MED ORDER — DEXTROSE-NACL 5-0.9 % IV SOLN
INTRAVENOUS | Status: DC
  Administered 2012-12-07 – 2012-12-08 (×2): via INTRAVENOUS

## 2012-12-07 MED ORDER — DIPHENHYDRAMINE HCL 12.5 MG/5ML PO ELIX: 12.5000 mg | ORAL_SOLUTION | ORAL | Status: DC | PRN

## 2012-12-07 MED ORDER — ALBUTEROL SULFATE HFA 108 (90 BASE) MCG/ACT IN AERS: 2.0000 | INHALATION_SPRAY | Freq: Four times a day (QID) | RESPIRATORY_TRACT | Status: DC | PRN

## 2012-12-07 MED ORDER — PROPOFOL 10 MG/ML IV BOLUS
INTRAVENOUS | Status: DC | PRN
  Administered 2012-12-07: 50 mg via INTRAVENOUS
  Administered 2012-12-07: 150 mg via INTRAVENOUS

## 2012-12-07 MED ORDER — MORPHINE SULFATE 2 MG/ML IJ SOLN: 1.0000 mg | INTRAMUSCULAR | Status: DC | PRN

## 2012-12-07 MED ORDER — CHLORHEXIDINE GLUCONATE CLOTH 2 % EX PADS: 6.0000 | MEDICATED_PAD | Freq: Every day | CUTANEOUS | Status: DC

## 2012-12-07 MED ORDER — LACTATED RINGERS IV SOLN
INTRAVENOUS | Status: DC | PRN
  Administered 2012-12-07: 08:00:00 via INTRAVENOUS

## 2012-12-07 MED ORDER — GLYCOPYRROLATE 0.2 MG/ML IJ SOLN
INTRAMUSCULAR | Status: DC | PRN
  Administered 2012-12-07: 0.2 mg via INTRAVENOUS

## 2012-12-07 MED ORDER — HYDROMORPHONE HCL PF 1 MG/ML IJ SOLN
INTRAMUSCULAR | Status: DC | PRN
  Administered 2012-12-07 (×2): 0.5 mg via INTRAVENOUS
  Administered 2012-12-07: 1 mg via INTRAVENOUS

## 2012-12-07 MED ORDER — BUPIVACAINE HCL (PF) 0.25 % IJ SOLN
INTRAMUSCULAR | Status: AC
  Filled 2012-12-07: qty 30

## 2012-12-07 MED ORDER — MUPIROCIN 2 % EX OINT
1.0000 "application " | TOPICAL_OINTMENT | Freq: Two times a day (BID) | CUTANEOUS | Status: DC
  Filled 2012-12-07: qty 22

## 2012-12-07 MED ORDER — CHLORHEXIDINE GLUCONATE 4 % EX LIQD: 60.0000 mL | Freq: Once | CUTANEOUS | Status: DC

## 2012-12-07 MED ORDER — FLUTICASONE PROPIONATE 50 MCG/ACT NA SUSP
2.0000 | Freq: Every day | NASAL | Status: DC
  Administered 2012-12-08 – 2012-12-10 (×3): 2 via NASAL
  Filled 2012-12-07: qty 16

## 2012-12-07 MED ORDER — SODIUM CHLORIDE 0.9 % IJ SOLN
INTRAMUSCULAR | Status: AC
  Filled 2012-12-07: qty 50

## 2012-12-07 MED ORDER — BUPIVACAINE HCL 0.25 % IJ SOLN
INTRAMUSCULAR | Status: DC | PRN
  Administered 2012-12-07: 20 mL

## 2012-12-07 MED ORDER — DEXAMETHASONE 6 MG PO TABS
10.0000 mg | ORAL_TABLET | Freq: Every day | ORAL | Status: AC
  Administered 2012-12-08: 10 mg via ORAL
  Filled 2012-12-07: qty 1

## 2012-12-07 MED ORDER — FLEET ENEMA 7-19 GM/118ML RE ENEM: 1.0000 | ENEMA | Freq: Once | RECTAL | Status: AC | PRN

## 2012-12-07 MED ORDER — METOCLOPRAMIDE HCL 5 MG/ML IJ SOLN
INTRAMUSCULAR | Status: DC | PRN
  Administered 2012-12-07: 10 mg via INTRAVENOUS

## 2012-12-07 MED ORDER — LIDOCAINE HCL (CARDIAC) 20 MG/ML IV SOLN
INTRAVENOUS | Status: DC | PRN
  Administered 2012-12-07: 100 mg via INTRAVENOUS

## 2012-12-07 MED ORDER — DOCUSATE SODIUM 100 MG PO CAPS
100.0000 mg | ORAL_CAPSULE | Freq: Two times a day (BID) | ORAL | Status: DC
  Administered 2012-12-07 – 2012-12-10 (×7): 100 mg via ORAL

## 2012-12-07 MED ORDER — BISACODYL 10 MG RE SUPP: 10.0000 mg | Freq: Every day | RECTAL | Status: DC | PRN

## 2012-12-07 MED ORDER — ACETAMINOPHEN 500 MG PO TABS
1000.0000 mg | ORAL_TABLET | Freq: Once | ORAL | Status: AC
  Administered 2012-12-07: 1000 mg via ORAL
  Filled 2012-12-07: qty 2

## 2012-12-07 MED ORDER — EPHEDRINE SULFATE 50 MG/ML IJ SOLN
INTRAMUSCULAR | Status: DC | PRN
  Administered 2012-12-07: 10 mg via INTRAVENOUS

## 2012-12-07 MED ORDER — MENTHOL 3 MG MT LOZG: 1.0000 | LOZENGE | OROMUCOSAL | Status: DC | PRN

## 2012-12-07 MED ORDER — ONDANSETRON HCL 4 MG PO TABS: 4.0000 mg | ORAL_TABLET | Freq: Four times a day (QID) | ORAL | Status: DC | PRN

## 2012-12-07 MED ORDER — SODIUM CHLORIDE 0.9 % IJ SOLN
INTRAMUSCULAR | Status: DC | PRN
  Administered 2012-12-07: 30 mL

## 2012-12-07 MED ORDER — ONDANSETRON HCL 4 MG/2ML IJ SOLN: 4.0000 mg | Freq: Four times a day (QID) | INTRAMUSCULAR | Status: DC | PRN

## 2012-12-07 MED ORDER — OXYCODONE HCL 5 MG PO TABS
5.0000 mg | ORAL_TABLET | ORAL | Status: DC | PRN
  Administered 2012-12-07 (×3): 10 mg via ORAL
  Administered 2012-12-08 (×2): 5 mg via ORAL
  Administered 2012-12-08 (×2): 10 mg via ORAL
  Administered 2012-12-09 (×4): 5 mg via ORAL
  Filled 2012-12-07: qty 2
  Filled 2012-12-07 (×3): qty 1
  Filled 2012-12-07 (×3): qty 2
  Filled 2012-12-07: qty 1
  Filled 2012-12-07: qty 2
  Filled 2012-12-07 (×2): qty 1

## 2012-12-07 MED ORDER — BUPIVACAINE LIPOSOME 1.3 % IJ SUSP
20.0000 mL | Freq: Once | INTRAMUSCULAR | Status: DC
  Filled 2012-12-07: qty 20

## 2012-12-07 MED ORDER — DEXAMETHASONE SODIUM PHOSPHATE 10 MG/ML IJ SOLN
10.0000 mg | Freq: Every day | INTRAMUSCULAR | Status: AC
  Filled 2012-12-07: qty 1

## 2012-12-07 MED ORDER — HYDROMORPHONE HCL PF 1 MG/ML IJ SOLN
INTRAMUSCULAR | Status: AC
  Filled 2012-12-07: qty 1

## 2012-12-07 MED ORDER — SODIUM CHLORIDE 0.9 % IV SOLN: INTRAVENOUS | Status: DC

## 2012-12-07 MED ORDER — METOCLOPRAMIDE HCL 10 MG PO TABS: 5.0000 mg | ORAL_TABLET | Freq: Three times a day (TID) | ORAL | Status: DC | PRN

## 2012-12-07 MED ORDER — SUCCINYLCHOLINE CHLORIDE 20 MG/ML IJ SOLN
INTRAMUSCULAR | Status: DC | PRN
  Administered 2012-12-07: 100 mg via INTRAVENOUS

## 2012-12-07 MED ORDER — CEFAZOLIN SODIUM-DEXTROSE 2-3 GM-% IV SOLR
INTRAVENOUS | Status: AC
  Filled 2012-12-07: qty 50

## 2012-12-07 MED ORDER — METOPROLOL TARTRATE 25 MG PO TABS
25.0000 mg | ORAL_TABLET | Freq: Two times a day (BID) | ORAL | Status: DC
Start: 2012-12-07 — End: 2012-12-07
  Filled 2012-12-07: qty 2

## 2012-12-07 MED ORDER — STERILE WATER FOR IRRIGATION IR SOLN
Status: DC | PRN
  Administered 2012-12-07 (×2): 1500 mL

## 2012-12-07 MED ORDER — PANTOPRAZOLE SODIUM 40 MG PO TBEC
40.0000 mg | DELAYED_RELEASE_TABLET | Freq: Every day | ORAL | Status: DC
  Filled 2012-12-07: qty 1

## 2012-12-07 MED ORDER — SODIUM CHLORIDE 0.9 % IR SOLN
Status: DC | PRN
  Administered 2012-12-07: 1000 mL

## 2012-12-07 MED ORDER — POLYETHYLENE GLYCOL 3350 17 G PO PACK
17.0000 g | PACK | Freq: Every day | ORAL | Status: DC | PRN
  Administered 2012-12-08: 17 g via ORAL

## 2012-12-07 MED ORDER — METHOCARBAMOL 500 MG PO TABS
500.0000 mg | ORAL_TABLET | Freq: Four times a day (QID) | ORAL | Status: DC | PRN
  Administered 2012-12-07 – 2012-12-10 (×8): 500 mg via ORAL
  Filled 2012-12-07 (×9): qty 1

## 2012-12-07 MED ORDER — METOPROLOL TARTRATE 50 MG PO TABS
50.0000 mg | ORAL_TABLET | Freq: Every day | ORAL | Status: DC
  Administered 2012-12-07 – 2012-12-08 (×2): 50 mg via ORAL
  Filled 2012-12-07 (×4): qty 1

## 2012-12-07 MED ORDER — METOCLOPRAMIDE HCL 5 MG/ML IJ SOLN: 5.0000 mg | Freq: Three times a day (TID) | INTRAMUSCULAR | Status: DC | PRN

## 2012-12-07 MED ORDER — BUPIVACAINE LIPOSOME 1.3 % IJ SUSP
INTRAMUSCULAR | Status: DC | PRN
  Administered 2012-12-07: 20 mL

## 2012-12-07 MED ORDER — MIDAZOLAM HCL 5 MG/5ML IJ SOLN
INTRAMUSCULAR | Status: DC | PRN
  Administered 2012-12-07: 2 mg via INTRAVENOUS

## 2012-12-07 MED ORDER — KETAMINE HCL 10 MG/ML IJ SOLN
INTRAMUSCULAR | Status: DC | PRN
  Administered 2012-12-07: 20 mg via INTRAVENOUS

## 2012-12-07 MED ORDER — LACTATED RINGERS IV SOLN: INTRAVENOUS | Status: DC

## 2012-12-07 MED ORDER — METOPROLOL TARTRATE 25 MG PO TABS
25.0000 mg | ORAL_TABLET | Freq: Every day | ORAL | Status: DC
  Administered 2012-12-08 – 2012-12-10 (×3): 25 mg via ORAL
  Filled 2012-12-07 (×3): qty 1

## 2012-12-07 MED ORDER — PROPAFENONE HCL 150 MG PO TABS
150.0000 mg | ORAL_TABLET | Freq: Three times a day (TID) | ORAL | Status: DC
Start: 2012-12-07 — End: 2012-12-10
  Administered 2012-12-07 – 2012-12-10 (×9): 150 mg via ORAL
  Filled 2012-12-07 (×11): qty 1

## 2012-12-07 MED ORDER — RIVAROXABAN 10 MG PO TABS
10.0000 mg | ORAL_TABLET | Freq: Every day | ORAL | Status: DC
  Administered 2012-12-08 – 2012-12-10 (×3): 10 mg via ORAL
  Filled 2012-12-07 (×4): qty 1

## 2012-12-07 MED ORDER — METHOCARBAMOL 100 MG/ML IJ SOLN
500.0000 mg | Freq: Four times a day (QID) | INTRAVENOUS | Status: DC | PRN
  Administered 2012-12-07: 500 mg via INTRAVENOUS
  Filled 2012-12-07: qty 5

## 2012-12-07 MED ORDER — ONDANSETRON HCL 4 MG/2ML IJ SOLN
INTRAMUSCULAR | Status: DC | PRN
  Administered 2012-12-07: 4 mg via INTRAVENOUS

## 2012-12-07 MED ORDER — KETOROLAC TROMETHAMINE 15 MG/ML IJ SOLN
7.5000 mg | Freq: Four times a day (QID) | INTRAMUSCULAR | Status: AC | PRN
  Administered 2012-12-07: 7.5 mg via INTRAVENOUS
  Filled 2012-12-07: qty 1

## 2012-12-07 MED ORDER — ACETAMINOPHEN 500 MG PO TABS
1000.0000 mg | ORAL_TABLET | Freq: Four times a day (QID) | ORAL | Status: AC
  Administered 2012-12-07 – 2012-12-08 (×4): 1000 mg via ORAL
  Filled 2012-12-07 (×4): qty 2

## 2012-12-07 MED ORDER — CEFAZOLIN SODIUM-DEXTROSE 2-3 GM-% IV SOLR
2.0000 g | INTRAVENOUS | Status: AC
  Administered 2012-12-07: 2 g via INTRAVENOUS

## 2012-12-07 SURGICAL SUPPLY — 57 items
BAG SPEC THK2 15X12 ZIP CLS (MISCELLANEOUS) ×1
BAG ZIPLOCK 12X15 (MISCELLANEOUS) ×2 IMPLANT
BANDAGE ELASTIC 6 VELCRO ST LF (GAUZE/BANDAGES/DRESSINGS) ×2 IMPLANT
BANDAGE ESMARK 6X9 LF (GAUZE/BANDAGES/DRESSINGS) ×1 IMPLANT
BLADE SAG 18X100X1.27 (BLADE) ×2 IMPLANT
BLADE SAW SGTL 11.0X1.19X90.0M (BLADE) ×2 IMPLANT
BNDG CMPR 9X6 STRL LF SNTH (GAUZE/BANDAGES/DRESSINGS) ×1
BNDG ESMARK 6X9 LF (GAUZE/BANDAGES/DRESSINGS) ×2
BOWL SMART MIX CTS (DISPOSABLE) ×2 IMPLANT
CAPT RP KNEE ×2 IMPLANT
CEMENT HV SMART SET (Cement) ×4 IMPLANT
CLOTH BEACON ORANGE TIMEOUT ST (SAFETY) ×2 IMPLANT
CUFF TOURN SGL QUICK 34 (TOURNIQUET CUFF) ×2
CUFF TRNQT CYL 34X4X40X1 (TOURNIQUET CUFF) ×1 IMPLANT
DECANTER SPIKE VIAL GLASS SM (MISCELLANEOUS) ×2 IMPLANT
DRAPE EXTREMITY T 121X128X90 (DRAPE) ×2 IMPLANT
DRAPE POUCH INSTRU U-SHP 10X18 (DRAPES) ×2 IMPLANT
DRAPE U-SHAPE 47X51 STRL (DRAPES) ×2 IMPLANT
DRSG ADAPTIC 3X8 NADH LF (GAUZE/BANDAGES/DRESSINGS) ×2 IMPLANT
DRSG PAD ABDOMINAL 8X10 ST (GAUZE/BANDAGES/DRESSINGS) ×2 IMPLANT
DURAPREP 26ML APPLICATOR (WOUND CARE) ×2 IMPLANT
ELECT REM PT RETURN 9FT ADLT (ELECTROSURGICAL) ×2
ELECTRODE REM PT RTRN 9FT ADLT (ELECTROSURGICAL) ×1 IMPLANT
EVACUATOR 1/8 PVC DRAIN (DRAIN) ×2 IMPLANT
FACESHIELD LNG OPTICON STERILE (SAFETY) ×10 IMPLANT
GLOVE BIO SURGEON STRL SZ7.5 (GLOVE) IMPLANT
GLOVE BIO SURGEON STRL SZ8 (GLOVE) ×2 IMPLANT
GLOVE BIOGEL PI IND STRL 8 (GLOVE) ×2 IMPLANT
GLOVE BIOGEL PI INDICATOR 8 (GLOVE) ×2
GLOVE SURG SS PI 6.5 STRL IVOR (GLOVE) IMPLANT
GOWN PREVENTION PLUS LG XLONG (DISPOSABLE) ×2 IMPLANT
GOWN STRL REIN XL XLG (GOWN DISPOSABLE) IMPLANT
HANDPIECE INTERPULSE COAX TIP (DISPOSABLE) ×2
IMMOBILIZER KNEE 20 (SOFTGOODS) ×2
IMMOBILIZER KNEE 20 THIGH 36 (SOFTGOODS) ×1 IMPLANT
KIT BASIN OR (CUSTOM PROCEDURE TRAY) ×2 IMPLANT
MANIFOLD NEPTUNE II (INSTRUMENTS) ×2 IMPLANT
NDL SAFETY ECLIPSE 18X1.5 (NEEDLE) ×2 IMPLANT
NEEDLE HYPO 18GX1.5 SHARP (NEEDLE) ×4
NS IRRIG 1000ML POUR BTL (IV SOLUTION) ×2 IMPLANT
PACK TOTAL JOINT (CUSTOM PROCEDURE TRAY) ×2 IMPLANT
PADDING CAST COTTON 6X4 STRL (CAST SUPPLIES) ×4 IMPLANT
POSITIONER SURGICAL ARM (MISCELLANEOUS) ×2 IMPLANT
SET HNDPC FAN SPRY TIP SCT (DISPOSABLE) ×1 IMPLANT
SPONGE GAUZE 4X4 12PLY (GAUZE/BANDAGES/DRESSINGS) ×2 IMPLANT
STRIP CLOSURE SKIN 1/2X4 (GAUZE/BANDAGES/DRESSINGS) ×4 IMPLANT
SUCTION FRAZIER 12FR DISP (SUCTIONS) ×2 IMPLANT
SUT MNCRL AB 4-0 PS2 18 (SUTURE) ×2 IMPLANT
SUT VIC AB 2-0 CT1 27 (SUTURE) ×6
SUT VIC AB 2-0 CT1 TAPERPNT 27 (SUTURE) ×3 IMPLANT
SUT VLOC 180 0 24IN GS25 (SUTURE) ×2 IMPLANT
SYR 20CC LL (SYRINGE) ×2 IMPLANT
SYR 50ML LL SCALE MARK (SYRINGE) ×2 IMPLANT
TOWEL OR 17X26 10 PK STRL BLUE (TOWEL DISPOSABLE) ×4 IMPLANT
TRAY FOLEY CATH 14FRSI W/METER (CATHETERS) ×2 IMPLANT
WATER STERILE IRR 1500ML POUR (IV SOLUTION) ×2 IMPLANT
WRAP KNEE MAXI GEL POST OP (GAUZE/BANDAGES/DRESSINGS) ×2 IMPLANT

## 2012-12-07 NOTE — Interval H&P Note (Signed)
History and Physical Interval Note:  12/07/2012 7:08 AM  Donna Moon  has presented today for surgery, with the diagnosis of OA OF LEFT KNEE  The various methods of treatment have been discussed with the patient and family. After consideration of risks, benefits and other options for treatment, the patient has consented to  Procedure(s): LEFT TOTAL KNEE ARTHROPLASTY (Left) as a surgical intervention .  The patient's history has been reviewed, patient examined, no change in status, stable for surgery.  I have reviewed the patient's chart and labs.  Questions were answered to the patient's satisfaction.     Loanne Drilling

## 2012-12-07 NOTE — Progress Notes (Signed)
UR COMPLETED  

## 2012-12-07 NOTE — Preoperative (Signed)
Beta Blockers   Reason not to administer Beta Blockers:Not Applicable Pt took Beta Blocker this AM 12-07-12

## 2012-12-07 NOTE — H&P (View-Only) (Signed)
Donna Moon  DOB: 03/12/1951 Single / Language: English / Race: White Female  Date of Admission:  12/07/2012  Chief Complaint:  Left Knee Pain  History of Present Illness The patient is a 62 year old female who comes in for a preoperative History and Physical. The patient is scheduled for a left total knee arthroplasty to be performed by Dr. Frank V. Aluisio, MD at [facility] on 12/07/2012 The patient is a 61 year old female who presents today for follow up of their knee. The patient is being followed for their left knee pain. They are now several week(s) out from her last cortisone injection. Symptoms reported today include: pain (burning). Note for "Follow-up Knee": Patient states that she has noticed the pain again in the last month. She said that she has been limping a lot because of her left knee. Because of this she has now developed increased lateral hip pain on the left. She is not having any groin pain. She is not having lower extremity weakness or paresthesia. The left knee is definitely limiting her more and more. She is ready to go ahead and get this fixed. They have been treated conservatively in the past for the above stated problem and despite conservative measures, they continue to have progressive pain and severe functional limitations and dysfunction. They have failed non-operative management including home exercise, medications, and injections. It is felt that they would benefit from undergoing total joint replacement. Risks and benefits of the procedure have been discussed with the patient and they elect to proceed with surgery. There are no active contraindications to surgery such as ongoing infection or rapidly progressive neurological disease.   Problem List S/P total hip arthroplasty (V43.64) Primary osteoarthritis of one knee (715.16)    Allergies Naprosyn *ANALGESICS - ANTI-INFLAMMATORY* Lexapro *ANTIDEPRESSANTS*   Family History Heart  disease in female family member before age 65 Congestive Heart Failure. mother Rheumatoid Arthritis. sister Heart disease in female family member before age 55 Osteoarthritis. mother and sister Kidney disease. mother, sister and child Cerebrovascular Accident. mother Cancer. mother and father mother, father and brother Hypertension. sister Heart Disease. mother and father Diabetes Mellitus. sister and grandmother fathers side grandmother mothers side   Social History Exercise. Exercises weekly; does individual sport, other, gym / weights and team sport Exercises weekly; does gym / weights Drug/Alcohol Rehab (Previously). no Drug/Alcohol Rehab (Currently). no Marital status. married Living situation. live with spouse Illicit drug use. no Current work status. retired Children. 1 Alcohol use. never consumed alcohol Tobacco / smoke exposure. no Pain Contract. no Number of flights of stairs before winded. less than 1 2-3 Tobacco use. Former smoker. never smoker former smoker Previously in rehab. no Post-Surgical Plans. She wants to look into [facility] after her surgery.   Medication History Propafenone HCl (150MG Tablet, Oral) Active. Metoprolol Succinate (25MG Tablet ER 24HR, Oral) Inactive. Prevacid (30MG Capsule DR, Oral) Inactive. Aspirin EC (325MG Tablet DR, Oral) Inactive. Flonase (50MCG/ACT Suspension, Nasal) Inactive. Calcium Carbonate (600MG Tablet, Oral) Active. TraMADol HCl (50MG Tablet, 1-2 Tablet Oral twice daily, Taken 03/05/2011 to 03/20/2011) Inactive. Ibuprofen 200 (200MG Tablet, Oral) Inactive.  Past Surgical History Arthroscopy of Shoulder. right Appendectomy Rotator Cuff Repair. right Dilation and Curettage of Uterus Carpal Tunnel Repair. bilateral Cesarean Delivery. 1 time Total Hip Replacement. left Cardiac Ablation Procedures. 2006, 20011 Cardiac Catherterization  Medical  History Osteoporosis Osteoarthritis Heart murmur Fibromyalgia Asthma Gastroesophageal Reflux Disease Cardiac Arrhythmia Atrial Fibrillation. Paroxsymal Autoimmune disorder. mother sister   Review   of Systems General:Not Present- Chills, Fever, Night Sweats, Fatigue, Weight Gain, Weight Loss and Memory Loss. Skin:Not Present- Hives, Itching, Rash, Eczema and Lesions. HEENT:Not Present- Tinnitus, Headache, Double Vision, Visual Loss, Hearing Loss and Dentures. Respiratory:Not Present- Shortness of breath with exertion, Shortness of breath at rest, Allergies, Coughing up blood and Chronic Cough. Cardiovascular:Present- Palpitations. Not Present- Chest Pain, Racing/skipping heartbeats, Difficulty Breathing Lying Down, Murmur and Swelling. Gastrointestinal:Not Present- Bloody Stool, Heartburn, Abdominal Pain, Vomiting, Nausea, Constipation, Diarrhea, Difficulty Swallowing, Jaundice and Loss of appetitie. Female Genitourinary:Not Present- Blood in Urine, Urinary frequency, Weak urinary stream, Discharge, Flank Pain, Incontinence, Painful Urination, Urgency, Urinary Retention and Urinating at Night. Musculoskeletal:Present- Joint Pain. Not Present- Muscle Weakness, Muscle Pain, Joint Swelling, Back Pain, Morning Stiffness and Spasms. Neurological:Not Present- Tremor, Dizziness, Blackout spells, Paralysis, Difficulty with balance and Weakness. Psychiatric:Not Present- Insomnia.   Vitals Weight: 215 lb Height: 68 in Body Surface Area: 2.16 m Body Mass Index: 32.69 kg/m Pulse: 56 (Regular) Resp.: 12 (Unlabored) BP: 116/68 (Sitting, Left Arm, Standard)    Physical Exam(Alexzandrew L Perkins, III PA-C; 11/10/2012 11:31 AM) The physical exam findings are as follows:   General Mental Status - Alert, cooperative and good historian. General Appearance- pleasant. Not in acute distress. Orientation- Oriented X3. Build & Nutrition- Well nourished and Well  developed.   Head and Neck Head- normocephalic, atraumatic . Neck Global Assessment- supple. no bruit auscultated on the right and no bruit auscultated on the left.   Eye Pupil- Bilateral- Regular and Round. Motion- Bilateral- EOMI.   Chest and Lung Exam Auscultation: Breath sounds:- clear at anterior chest wall and - clear at posterior chest wall. Adventitious sounds:- No Adventitious sounds.   Cardiovascular Auscultation:Rhythm- Regular rate and rhythm. Heart Sounds- S1 WNL and S2 WNL. Murmurs & Other Heart Sounds:Auscultation of the heart reveals - No Murmurs.   Abdomen Palpation/Percussion:Tenderness- Abdomen is non-tender to palpation. Rigidity (guarding)- Abdomen is soft. Auscultation:Auscultation of the abdomen reveals - Bowel sounds normal.   Female Genitourinary Not done, not pertinent to present illness  Musculoskeletal On exam she is alert and oriented in no apparent distress. Her left hip could be flexed to 110, rotated in 30, out 40, abducted 40 without pain on range of motion of the hip. She is very tender over the greater trochanter. Her left knee shows no effusion. There is moderate crepitus on range of motion in the knee with slight varus deformity. Range 5 to 125. Very tender medially. No lateral tenderness or instability.  RADIOGRAPHS: Radiographs show she has bone on bone medial and patellofemoral in that left knee. She has had multiple radiographs of that left hip and the prosthesis has remained in good position with no abnormalities.  Assessment & Plan Primary osteoarthritis of one knee (715.16) Impression: Left Knee  Note: Plan is for a Left Total Knee Replacement by Dr. Aluisio.  Plan is to go to rehab.  Heart - Dr. Croitoru  The patient does not have any contraindications and will recieve TXA (tranexamic acid) prior to surgery.  Signed electronically by Alexzandrew L Perkins, III PA-C 

## 2012-12-07 NOTE — Op Note (Signed)
Pre-operative diagnosis- Osteoarthritis  Left knee(s)  Post-operative diagnosis- Osteoarthritis Left knee(s)  Procedure-  Left  Total Knee Arthroplasty  Surgeon- Gus Rankin. Lenetta Piche, MD  Assistant- Avel Peace, PA-C   Anesthesia-  General EBL-* No blood loss amount entered *  Drains Hemovac  Tourniquet time-  Total Tourniquet Time Documented: Thigh (Left) - 34 minutes Total: Thigh (Left) - 34 minutes    Complications- None  Condition-PACU - hemodynamically stable.   Brief Clinical Note  Donna Moon is a 62 y.o. year old female with end stage OA of her left knee with progressively worsening pain and dysfunction. She has constant pain, with activity and at rest and significant functional deficits with difficulties even with ADLs. She has had extensive non-op management including analgesics, injections of cortisone and viscosupplements, and home exercise program, but remains in significant pain with significant dysfunction. Radiographs show bone on bone arthritis medial and patellofemoral. She presents now for left Total Knee Arthroplasty.    Procedure in detail---   The patient is brought into the operating room and positioned supine on the operating table. After successful administration of  General,   a tourniquet is placed high on the  Left thigh(s) and the lower extremity is prepped and draped in the usual sterile fashion. Time out is performed by the operating team and then the  Left lower extremity is wrapped in Esmarch, knee flexed and the tourniquet inflated to 300 mmHg.       A midline incision is made with a ten blade through the subcutaneous tissue to the level of the extensor mechanism. A fresh blade is used to make a medial parapatellar arthrotomy. Soft tissue over the proximal medial tibia is subperiosteally elevated to the joint line with a knife and into the semimembranosus bursa with a Cobb elevator. Soft tissue over the proximal lateral tibia is elevated with attention being  paid to avoiding the patellar tendon on the tibial tubercle. The patella is everted, knee flexed 90 degrees and the ACL and PCL are removed. Findings are bone on bone medial and patellofemoral with large medial osteophytes.        The drill is used to create a starting hole in the distal femur and the canal is thoroughly irrigated with sterile saline to remove the fatty contents. The 5 degree Left  valgus alignment guide is placed into the femoral canal and the distal femoral cutting block is pinned to remove 10 mm off the distal femur. Resection is made with an oscillating saw.      The tibia is subluxed forward and the menisci are removed. The extramedullary alignment guide is placed referencing proximally at the medial aspect of the tibial tubercle and distally along the second metatarsal axis and tibial crest. The block is pinned to remove 2mm off the more deficient medial  side. Resection is made with an oscillating saw. Size 4is the most appropriate size for the tibia and the proximal tibia is prepared with the modular drill and keel punch for that size.      The femoral sizing guide is placed and size 4 is most appropriate. Rotation is marked off the epicondylar axis and confirmed by creating a rectangular flexion gap at 90 degrees. The size 4 cutting block is pinned in this rotation and the anterior, posterior and chamfer cuts are made with the oscillating saw. The intercondylar block is then placed and that cut is made.      Trial size 4 tibial component, trial size 4  posterior stabilized femur and a 10  mm posterior stabilized rotating platform insert trial is placed. Full extension is achieved with excellent varus/valgus and anterior/posterior balance throughout full range of motion. The patella is everted and thickness measured to be 24  mm. Free hand resection is taken to 14 mm, a 38 template is placed, lug holes are drilled, trial patella is placed, and it tracks normally. Osteophytes are removed  off the posterior femur with the trial in place. All trials are removed and the cut bone surfaces prepared with pulsatile lavage. Cement is mixed and once ready for implantation, the size 4 tibial implant, size  4 posterior stabilized femoral component, and the size 38 patella are cemented in place and the patella is held with the clamp. The trial insert is placed and the knee held in full extension. The Exparel (20 ml mixed with 30 ml saline) and .25% Bupivicaine, are injected into the extensor mechanism, posterior capsule, medial and lateral gutters and subcutaneous tissues.  All extruded cement is removed and once the cement is hard the permanent 10 mm posterior stabilized rotating platform insert is placed into the tibial tray.      The wound is copiously irrigated with saline solution and the extensor mechanism closed over a hemovac drain with #1 PDS suture. The tourniquet is released for a total tourniquet time of 34  minutes. Flexion against gravity is 140 degrees and the patella tracks normally. Subcutaneous tissue is closed with 2.0 vicryl and subcuticular with running 4.0 Monocryl. The incision is cleaned and dried and steri-strips and a bulky sterile dressing are applied. The limb is placed into a knee immobilizer and the patient is awakened and transported to recovery in stable condition.      Please note that a surgical assistant was a medical necessity for this procedure in order to perform it in a safe and expeditious manner. Surgical assistant was necessary to retract the ligaments and vital neurovascular structures to prevent injury to them and also necessary for proper positioning of the limb to allow for anatomic placement of the prosthesis.   Gus Rankin Donna Mula, MD    12/07/2012, 9:20 AM

## 2012-12-07 NOTE — Evaluation (Signed)
Physical Therapy Evaluation Patient Details Name: Donna Moon MRN: [id number] DOB: 12/02/1950 Today's Date: 12/07/2012 Time: 4098-1191 PT Time Calculation (min): 20 min  PT Assessment / Plan / Recommendation History of Present Illness  s/p L TKA  Clinical Impression  Pt will benefit from PT in the acute setting to address deficits below; Plan is for [facility]SNF at [facility]    PT Assessment  Patient needs continued PT services    Follow Up Recommendations  SNF    Does the patient have the potential to tolerate intense rehabilitation      Barriers to Discharge        Equipment Recommendations  None recommended by PT    Recommendations for Other Services     Frequency 7X/week    Precautions / Restrictions Precautions Required Braces or Orthoses: Knee Immobilizer - Left Knee Immobilizer - Left: Discontinue once straight leg raise with < 10 degree lag Restrictions Weight Bearing Restrictions: No Other Position/Activity Restrictions: WBAT   Pertinent Vitals/Pain 0/10 left knee      Mobility  Bed Mobility Bed Mobility: Supine to Sit Supine to Sit: 4: Min assist Details for Bed Mobility Assistance: min with LLE and cues for self assist Transfers Transfers: Sit to Stand;Stand to Sit Sit to Stand: 4: Min assist Stand to Sit: 4: Min assist Details for Transfer Assistance: verbal cues for hand placement and LLE position Ambulation/Gait Ambulation/Gait Assistance: 4: Min assist Ambulation Distance (Feet): 110 Feet Assistive device: Rolling walker Ambulation/Gait Assistance Details: verbal cues for RW safety, sequence, and min assist for gait stability Gait Pattern: Step-to pattern;Antalgic    Exercises Total Joint Exercises Ankle Circles/Pumps: AROM;Both;10 reps Quad Sets: Left;5 reps   PT Diagnosis: Difficulty walking  PT Problem List: Decreased strength;Decreased range of motion;Decreased activity tolerance;Decreased balance;Decreased mobility;Decreased safety  awareness;Decreased knowledge of use of DME PT Treatment Interventions: Functional mobility training;Gait training;DME instruction;Therapeutic activities;Therapeutic exercise;Patient/family education     PT Goals(Current goals can be found in the care plan section) Acute Rehab PT Goals Patient Stated Goal: return to I PT Goal Formulation: With patient Time For Goal Achievement: 12/14/12 Potential to Achieve Goals: Good  Visit Information  Last PT Received On: 12/07/12 Assistance Needed: +1 History of Present Illness: s/p L TKA       Prior Functioning  Home Living Family/patient expects to be discharged to:: Skilled nursing facility Living Arrangements: Spouse/significant other Home Equipment: Walker - 2 wheels;Bedside commode Additional Comments: [employer] Prior Function Level of Independence: Independent    Cognition  Cognition Arousal/Alertness: Awake/alert Behavior During Therapy: WFL for tasks assessed/performed Overall Cognitive Status: Within Functional Limits for tasks assessed    Extremity/Trunk Assessment Lower Extremity Assessment Lower Extremity Assessment: LLE deficits/detail LLE Deficits / Details: ankle WFL; able to assist with SLR LLE: Unable to fully assess due to pain   Balance    End of Session PT - End of Session Equipment Utilized During Treatment: Gait belt;Left knee immobilizer Activity Tolerance: Patient tolerated treatment well Patient left: in chair;with call bell/phone within reach;with family/visitor present Nurse Communication: Mobility status CPM Left Knee CPM Left Knee: Off  GP     [facility] 12/07/2012, 5:44 PM

## 2012-12-07 NOTE — Anesthesia Postprocedure Evaluation (Signed)
  Anesthesia Post-op Note  Patient: Donna Moon  Procedure(s) Performed: Procedure(s) (LRB): LEFT TOTAL KNEE ARTHROPLASTY (Left)  Patient Location: PACU  Anesthesia Type: General  Level of Consciousness: awake and alert   Airway and Oxygen Therapy: Patient Spontanous Breathing  Post-op Pain: mild  Post-op Assessment: Post-op Vital signs reviewed, Patient's Cardiovascular Status Stable, Respiratory Function Stable, Patent Airway and No signs of Nausea or vomiting  Last Vitals:  Filed Vitals:   12/07/12 0604  BP: 155/87  Pulse: 58  Temp: 36.6 C  Resp: 18    Post-op Vital Signs: stable   Complications: No apparent anesthesia complications

## 2012-12-07 NOTE — Transfer of Care (Signed)
Immediate Anesthesia Transfer of Care Note  Patient: Donna Moon  Procedure(s) Performed: Procedure(s) (LRB): LEFT TOTAL KNEE ARTHROPLASTY (Left)  Patient Location: PACU  Anesthesia Type: General  Level of Consciousness: sedated, patient cooperative and responds to stimulation  Airway & Oxygen Therapy: Patient Spontanous Breathing and Patient connected to face mask oxgen  Post-op Assessment: Report given to PACU RN and Post -op Vital signs reviewed and stable  Post vital signs: Reviewed and stable  Complications: No apparent anesthesia complications

## 2012-12-07 NOTE — Anesthesia Preprocedure Evaluation (Addendum)
Anesthesia Evaluation  Patient identified by MRN, date of birth, ID band Patient awake    Reviewed: Allergy & Precautions, H&P , NPO status , Patient's Chart, lab work & pertinent test results  Airway Mallampati: II TM Distance: >3 FB Neck ROM: Full    Dental no notable dental hx.    Pulmonary neg pulmonary ROS,  breath sounds clear to auscultation  Pulmonary exam normal       Cardiovascular negative cardio ROS  + dysrhythmias Atrial Fibrillation Rhythm:Regular Rate:Normal     Neuro/Psych negative neurological ROS  negative psych ROS   GI/Hepatic negative GI ROS, Neg liver ROS,   Endo/Other  negative endocrine ROSMorbid obesity  Renal/GU negative Renal ROS  negative genitourinary   Musculoskeletal negative musculoskeletal ROS (+)   Abdominal   Peds negative pediatric ROS (+)  Hematology negative hematology ROS (+)   Anesthesia Other Findings   Reproductive/Obstetrics negative OB ROS                           Anesthesia Physical Anesthesia Plan  ASA: II  Anesthesia Plan: General   Post-op Pain Management:    Induction: Intravenous  Airway Management Planned: LMA and Oral ETT  Additional Equipment:   Intra-op Plan:   Post-operative Plan: Extubation in OR  Informed Consent: I have reviewed the patients History and Physical, chart, labs and discussed the procedure including the risks, benefits and alternatives for the proposed anesthesia with the patient or authorized representative who has indicated his/her understanding and acceptance.   Dental advisory given  Plan Discussed with: CRNA and Surgeon  Anesthesia Plan Comments:        Anesthesia Quick Evaluation

## 2012-12-08 ENCOUNTER — Encounter (HOSPITAL_COMMUNITY): Payer: Self-pay | Admitting: Orthopedic Surgery

## 2012-12-08 DIAGNOSIS — D62 Acute posthemorrhagic anemia: Secondary | ICD-10-CM | POA: Diagnosis not present

## 2012-12-08 LAB — BASIC METABOLIC PANEL
Calcium: 9 mg/dL (ref 8.4–10.5)
Chloride: 106 mEq/L (ref 96–112)
Creatinine, Ser: 0.89 mg/dL (ref 0.50–1.10)
GFR calc Af Amer: 79 mL/min — ABNORMAL LOW (ref >90)
GFR calc non Af Amer: 68 mL/min — ABNORMAL LOW (ref >90)
Glucose, Bld: 127 mg/dL — ABNORMAL HIGH (ref 70–99)

## 2012-12-08 LAB — CBC
HCT: 29.7 % — ABNORMAL LOW (ref 36.0–46.0)
MCH: 32 pg (ref 26.0–34.0)
MCHC: 34.3 g/dL (ref 30.0–36.0)
MCV: 93.1 fL (ref 78.0–100.0)
Platelets: 189 10*3/uL (ref 150–400)
RDW: 12.8 % (ref 11.5–15.5)

## 2012-12-08 MED ORDER — LANSOPRAZOLE 30 MG PO CPDR
30.0000 mg | DELAYED_RELEASE_CAPSULE | Freq: Every day | ORAL | Status: DC
  Administered 2012-12-08 – 2012-12-10 (×3): 30 mg via ORAL
  Filled 2012-12-08 (×3): qty 1

## 2012-12-08 MED ORDER — NON FORMULARY: 30.0000 mg | Freq: Every day | Status: DC

## 2012-12-08 NOTE — Progress Notes (Addendum)
Clinical Social Work Department CLINICAL SOCIAL WORK PLACEMENT NOTE 12/08/2012  Patient:  Donna Moon, Donna Moon  Account Number:  [id number] Admit date:  12/07/2012  Clinical Social Worker:  Cori Razor, LCSW  Date/time:  12/08/2012 10:35 AM  Clinical Social Work is seeking post-discharge placement for this patient at the following level of care:   SKILLED NURSING   (*CSW will update this form in Epic as items are completed)   12/08/2012  Patient/family provided with [facility] Department of Clinical Social Work's list of facilities offering this level of care within the geographic area requested by the patient (or if unable, by the patient's family).  12/08/2012  Patient/family informed of their freedom to choose among providers that offer the needed level of care, that participate in Medicare, Medicaid or managed care program needed by the patient, have an available bed and are willing to accept the patient.  12/08/2012  Patient/family informed of [facility]' ownership interest in [facility], as well as of the fact that they are under no obligation to receive care at this facility.  PASARR submitted to EDS on 12/08/2012 PASARR number received from [employer] on 12/08/2012  FL2 transmitted to all facilities in geographic area requested by pt/family on  12/08/2012 FL2 transmitted to all facilities within larger geographic area on   Patient informed that his/her managed care company has contracts with or will negotiate with  certain facilities, including the following:     Patient/family informed of bed offers received:  12/09/12 Patient chooses bed at [facility] Physician recommends and patient chooses bed at    Patient to be transferred to  [facility] on  12/10/12 Patient to be transferred to facility by Husband  The following physician request were entered in Epic:   Additional Comments:  Cori Razor LCSW [phone]

## 2012-12-08 NOTE — Evaluation (Signed)
Occupational Therapy Evaluation Patient Details Name: Donna Moon MRN: [id number] DOB: 05/24/1950 Today's Date: 12/08/2012 Time: 6962-9528 OT Time Calculation (min): 24 min  OT Assessment / Plan / Recommendation History of present illness s/p L TKA   Clinical Impression   Pt  will be discharging to SNF after acute care d/c for further therapy and care. Pt had ADL A/E at home and is min guard A with ADL mobility All education completed and no further acute  OT services indicated at this time    OT Assessment  All further OT needs can be met in the next venue of care    Follow Up Recommendations  SNF    Barriers to Discharge      Equipment Recommendations  None recommended by OT;Other (comment) (TBD at SNF level of care)    Recommendations for Other Services    Frequency       Precautions / Restrictions Precautions Precautions: Fall;Knee Precaution Comments: Instructed pt on KI use for amb Required Braces or Orthoses: Knee Immobilizer - Left Knee Immobilizer - Left: Discontinue once straight leg raise with < 10 degree lag Restrictions Weight Bearing Restrictions: No Other Position/Activity Restrictions: WBAT   Pertinent Vitals/Pain 5/10    ADL  Grooming: Performed;Wash/dry hands;Wash/dry face;Min guard Where Assessed - Grooming: Supported standing Upper Body Bathing: Simulated;Supervision/safety;Set up Lower Body Bathing: Performed;Moderate assistance Upper Body Dressing: Performed;Supervision/safety;Set up Lower Body Dressing: Performed;Moderate assistance Toilet Transfer: Performed;Min Pension scheme manager Method: Sit to Barista: Regular height toilet;Grab bars Toileting - Clothing Manipulation and Hygiene: Performed;Minimal assistance Where Assessed - Engineer, mining and Hygiene: Standing Tub/Shower Transfer Method: Not assessed Transfers/Ambulation Related to ADLs: cues for correct hand placement ADL Comments: pt familar  with ADL A/E and already has at home    OT Diagnosis: Generalized weakness;Acute pain  OT Problem List: Decreased strength;Pain;Impaired balance (sitting and/or standing);Decreased activity tolerance OT Treatment Interventions:     OT Goals(Current goals can be found in the care plan section) Acute Rehab OT Goals Patient Stated Goal: return to I  Visit Information  Last OT Received On: 12/08/12 Assistance Needed: +1 History of Present Illness: s/p L TKA       Prior Functioning     Home Living Family/patient expects to be discharged to:: Skilled nursing facility Living Arrangements: Spouse/significant other Home Equipment: Walker - 2 wheels;Bedside commode;Adaptive equipment Adaptive Equipment: Reacher;Sock aid;Long-handled shoe horn;Long-handled sponge Additional Comments: [facility] in [place] Prior Function Level of Independence: Independent Communication Communication: No difficulties Dominant Hand: Right         Vision/Perception Vision - History Baseline Vision: Wears glasses only for reading Patient Visual Report: No change from baseline Perception Perception: Within Functional Limits   Cognition  Cognition Arousal/Alertness: Awake/alert Behavior During Therapy: WFL for tasks assessed/performed Overall Cognitive Status: Within Functional Limits for tasks assessed    Extremity/Trunk Assessment Upper Extremity Assessment Upper Extremity Assessment: Overall WFL for tasks assessed;Generalized weakness;RUE deficits/detail;LUE deficits/detail RUE Deficits / Details: decreased AROM/weakness in shoulder LUE Deficits / Details: decreased AROM/weakness in shoulder, frozen shoulder Lower Extremity Assessment Lower Extremity Assessment: Defer to PT evaluation Cervical / Trunk Assessment Cervical / Trunk Assessment: Normal     Mobility Bed Mobility Bed Mobility: Not assessed Details for Bed Mobility Assistance: Pt OOB in recliner Transfers Transfers: Sit to  Stand;Stand to Sit Sit to Stand: 4: Min guard;From chair/3-in-1;From toilet Stand to Sit: 4: Min guard;To chair/3-in-1 Details for Transfer Assistance: cues for correct hand placement     Exercise  Balance Balance Balance Assessed: Yes Dynamic Standing Balance Dynamic Standing - Balance Support: Left upper extremity supported;During functional activity Dynamic Standing - Level of Assistance: Other (comment);4: Min assist (min guard A)   End of Session OT - End of Session Equipment Utilized During Treatment: Rolling walker Activity Tolerance: Patient tolerated treatment well Patient left: in chair;with call bell/phone within reach;with family/visitor present CPM Left Knee CPM Left Knee: Off  GO     Margaretmary Eddy [facility] 12/08/2012, 1:52 PM

## 2012-12-08 NOTE — Progress Notes (Signed)
Physical Therapy Treatment Patient Details Name: Donna Moon MRN: [id number] DOB: 10/11/1950 Today's Date: 12/08/2012 Time: 4098-1191 PT Time Calculation (min): 23 min  PT Assessment / Plan / Recommendation  History of Present Illness s/p L TKA   PT Comments   POD # 1 am session.  Pt OOB in recliner.  Assisted with amb in hallway then performed TKR TE's followed by ICE.  Pt plans to D/C to [facility] for [facility] rehab.   Follow Up Recommendations  SNF     Does the patient have the potential to tolerate intense rehabilitation     Barriers to Discharge        Equipment Recommendations       Recommendations for Other Services    Frequency 7X/week   Progress towards PT Goals Progress towards PT goals: Progressing toward goals  Plan      Precautions / Restrictions Precautions Precautions: Fall;Knee Precaution Comments: Instructed pt on KI use for amb Required Braces or Orthoses: Knee Immobilizer - Left Knee Immobilizer - Left: Discontinue once straight leg raise with < 10 degree lag Restrictions Other Position/Activity Restrictions: WBAT    Pertinent Vitals/Pain C/o 3/10 during TE's ICE applied    Mobility  Bed Mobility Bed Mobility: Not assessed Details for Bed Mobility Assistance: Pt OOB in recliner Transfers Transfers: Sit to Stand;Stand to Sit Sit to Stand: 4: Min guard;5: Supervision;From chair/3-in-1 Stand to Sit: 5: Supervision;4: Min guard;To chair/3-in-1 Details for Transfer Assistance: <25% verbal cues for hand placement and LLE position Ambulation/Gait Ambulation/Gait Assistance: 4: Min guard;5: Supervision Ambulation Distance (Feet): 165 Feet Assistive device: Rolling walker Ambulation/Gait Assistance Details: <25% VC's safety with turns and backward gait Gait Pattern: Step-to pattern;Antalgic    Exercises   Total Knee Replacement TE's 10 reps B LE ankle pumps 10 reps knee presses 10 reps heel slides  10 reps SAQ's 10 reps SLR's 10 reps ABD Followed  by ICE   PT Goals (current goals can now be found in the care plan section)    Visit Information  Last PT Received On: 12/08/12 Assistance Needed: +1 History of Present Illness: s/p L TKA    Subjective Data      Cognition  Cognition Arousal/Alertness: Awake/alert Behavior During Therapy: WFL for tasks assessed/performed Overall Cognitive Status: Within Functional Limits for tasks assessed    Balance     End of Session PT - End of Session Equipment Utilized During Treatment: Gait belt;Left knee immobilizer Activity Tolerance: Patient tolerated treatment well Patient left: in chair;with call bell/phone within reach;with family/visitor present   Donna Moon  PTA [facility]  Acute  Rehab Pager      [phone]

## 2012-12-08 NOTE — Care Management Note (Signed)
    Page 1 of 1   12/08/2012     12:02:10 PM   CARE MANAGEMENT NOTE 12/08/2012  Patient:  Donna Moon, Donna Moon   Account Number:  [id number]  Date Initiated:  12/08/2012  Documentation initiated by:  Colleen Can  Subjective/Objective Assessment:   dx: OA left knee; Total knee replacemnt     Action/Plan:   CM spoke with patient & spouse. Current plans are for patient to go to SNF for rehab.   Anticipated DC Date:  12/10/2012   Anticipated DC Plan:  SKILLED NURSING FACILITY  In-house referral  Clinical Social Worker      DC Planning Services  CM consult      Choice offered to / List presented to:             Status of service:  Completed, signed off Medicare Important Message given?  NA - LOS <3 / Initial given by admissions (If response is "NO", the following Medicare IM given date fields will be blank) Date Medicare IM given:   Date Additional Medicare IM given:    Discharge Disposition:    Per UR Regulation:    If discussed at Long Length of Stay Meetings, dates discussed:    Comments:

## 2012-12-08 NOTE — Progress Notes (Signed)
Physical Therapy Treatment Patient Details Name: Donna Moon MRN: [id number] DOB: 1950/05/12 Today's Date: 12/08/2012 Time: 9563-8756 PT Time Calculation (min): 24 min  PT Assessment / Plan / Recommendation  History of Present Illness s/p L TKA   PT Comments   POD # 1 pm session.  Assisted pt to amb in hallway second time then to BR to void.  Assisted back to bed for CPM.   Follow Up Recommendations  SNF     Does the patient have the potential to tolerate intense rehabilitation     Barriers to Discharge        Equipment Recommendations  None recommended by PT    Recommendations for Other Services    Frequency 7X/week   Progress towards PT Goals Progress towards PT goals: Progressing toward goals  Plan      Precautions / Restrictions Precautions Precautions: Fall;Knee Precaution Comments: Instructed pt on KI use for amb Required Braces or Orthoses: Knee Immobilizer - Left Knee Immobilizer - Left: Discontinue once straight leg raise with < 10 degree lag Restrictions Weight Bearing Restrictions: No Other Position/Activity Restrictions: WBAT    Pertinent Vitals/Pain C/o 3/10     Mobility  Bed Mobility Bed Mobility: Sit to Supine Sit to Supine: 4: Min guard Details for Bed Mobility Assistance: Assisted back to bed Transfers Transfers: Sit to Stand;Stand to Sit Sit to Stand: 4: Min guard;From chair/3-in-1;From toilet Stand to Sit: 4: Min guard;To bed;To toilet Details for Transfer Assistance: cues for correct hand placement and increased time Ambulation/Gait Ambulation/Gait Assistance: 4: Min guard;5: Supervision Ambulation Distance (Feet): 175 Feet Assistive device: Rolling walker Ambulation/Gait Assistance Details: one VC on safety with backward gait Gait Pattern: Step-to pattern;Antalgic     PT Goals (current goals can now be found in the care plan section) Acute Rehab PT Goals Patient Stated Goal: return to I  Visit Information  Last PT Received On:  12/08/12 Assistance Needed: +1 History of Present Illness: s/p L TKA    Subjective Data  Patient Stated Goal: return to I   Cognition  Cognition Arousal/Alertness: Awake/alert Behavior During Therapy: WFL for tasks assessed/performed Overall Cognitive Status: Within Functional Limits for tasks assessed    Balance  Balance Balance Assessed: Yes Dynamic Standing Balance Dynamic Standing - Balance Support: Left upper extremity supported;During functional activity Dynamic Standing - Level of Assistance: Other (comment);4: Min assist (min guard A)  End of Session PT - End of Session Equipment Utilized During Treatment: Gait belt;Left knee immobilizer Activity Tolerance: Patient tolerated treatment well Patient left: in bed;with call bell/phone within reach;with family/visitor present CPM Left Knee CPM Left Knee: Off   [name]  PTA [facility]  Acute  Rehab Pager      [phone]

## 2012-12-08 NOTE — Progress Notes (Signed)
   Subjective: 1 Day Post-Op Procedure(s) (LRB): LEFT TOTAL KNEE ARTHROPLASTY (Left) Patient reports pain as mild.   Patient seen in rounds with Dr. Lequita Halt. Patient is well, and has had no acute complaints or problems We will start therapy today.  Plan is to go Skilled nursing facility after hospital stay.  Objective: Vital signs in last 24 hours: Temp:  [97.4 F (36.3 C)-98.8 F (37.1 C)] 98.1 F (36.7 C) (09/23 0525) Pulse Rate:  [57-70] 58 (09/23 0525) Resp:  [12-16] 16 (09/23 0737) BP: (102-138)/(48-73) 107/73 mmHg (09/23 0525) SpO2:  [92 %-100 %] 96 % (09/23 0737) Weight:  [99.054 kg (218 lb 6 oz)] 99.054 kg (218 lb 6 oz) (09/22 1102)  Intake/Output from previous day:  Intake/Output Summary (Last 24 hours) at 12/08/12 0745 Last data filed at 12/08/12 0653  Gross per 24 hour  Intake 4028.75 ml  Output   3960 ml  Net  68.75 ml    Intake/Output this shift:    Labs:  Recent Labs  12/08/12 0351  HGB 10.2*    Recent Labs  12/08/12 0351  WBC 10.0  RBC 3.19*  HCT 29.7*  PLT 189    Recent Labs  12/08/12 0351  NA 138  K 4.2  CL 106  CO2 26  BUN 12  CREATININE 0.89  GLUCOSE 127*  CALCIUM 9.0   No results found for this basename: LABPT, INR,  in the last 72 hours  EXAM General - Patient is Alert, Appropriate and Oriented Extremity - Neurovascular intact Sensation intact distally Dorsiflexion/Plantar flexion intact Dressing - dressing C/D/I Motor Function - intact, moving foot and toes well on exam.  Hemovac pulled without difficulty.  Past Medical History  Diagnosis Date  . Atrial fibrillation   . Dizziness   . Obesity   . PONV (postoperative nausea and vomiting)     however no problems with past surgeries here.  . Asthma   . Hx of seasonal allergies     tx. Allegra, Flonase    Assessment/Plan: 1 Day Post-Op Procedure(s) (LRB): LEFT TOTAL KNEE ARTHROPLASTY (Left) Principal Problem:   OA (osteoarthritis) of knee Active Problems:  Postoperative anemia due to acute blood loss  Estimated body mass index is 33.21 kg/(m^2) as calculated from the following:   Height as of this encounter: 5\' 8"  (1.727 m).   Weight as of this encounter: 99.054 kg (218 lb 6 oz). Advance diet Up with therapy Discharge to SNF  DVT Prophylaxis - Xarelto Weight-Bearing as tolerated to left leg No vaccines. D/C O2 and Pulse OX and try on Room [address]  Patrica Duel 12/08/2012, 7:45 AM

## 2012-12-08 NOTE — Progress Notes (Signed)
Clinical Social Work Department BRIEF PSYCHOSOCIAL ASSESSMENT 12/08/2012  Patient:  ROXSANA, RIDING     Account Number:  [id number]     Admit date:  12/07/2012  Clinical Social Worker:  Candie Chroman  Date/Time:  12/08/2012 10:10 AM  Referred by:  Physician  Date Referred:  12/08/2012 Referred for  SNF Placement   Other Referral:   Interview type:  Patient Other interview type:    PSYCHOSOCIAL DATA Living Status:  HUSBAND Admitted from facility:   Level of care:   Primary support name:  Moishe Spice Primary support relationship to patient:  SPOUSE Degree of support available:   supportive    CURRENT CONCERNS Current Concerns  Post-Acute Placement   Other Concerns:    SOCIAL WORK ASSESSMENT / PLAN Pt is a 62 yr old female living at home prior to hospitalization. CSW met with pt to assist with d/c planning. ST Rehab will be needed following hospital d/c. Pt would like rehab at [facility]. SNF contacted and a decision is pending. CSW has initiated SNF search to provide additional options in case [facility] is unable to assist.   Assessment/plan status:  Psychosocial Support/Ongoing Assessment of Needs Other assessment/ plan:   Information/referral to community resources:   SNF list with bed offers to be provided.    PATIENT'S/FAMILY'S RESPONSE TO PLAN OF CARE: Pt hopes to have ST Rehab at [facility] but will consider other SNF options if necessary.   Cori Razor LCSW [phone]

## 2012-12-09 LAB — CBC
HCT: 28.8 % — ABNORMAL LOW (ref 36.0–46.0)
Hemoglobin: 9.6 g/dL — ABNORMAL LOW (ref 12.0–15.0)
MCHC: 33.3 g/dL (ref 30.0–36.0)
MCV: 93.2 fL (ref 78.0–100.0)
Platelets: 172 10*3/uL (ref 150–400)
RDW: 13.1 % (ref 11.5–15.5)

## 2012-12-09 LAB — BASIC METABOLIC PANEL
BUN: 11 mg/dL (ref 6–23)
Calcium: 8.9 mg/dL (ref 8.4–10.5)
Creatinine, Ser: 0.87 mg/dL (ref 0.50–1.10)
GFR calc Af Amer: 81 mL/min — ABNORMAL LOW (ref >90)
GFR calc non Af Amer: 70 mL/min — ABNORMAL LOW (ref >90)
Glucose, Bld: 104 mg/dL — ABNORMAL HIGH (ref 70–99)
Potassium: 3.5 mEq/L (ref 3.5–5.1)

## 2012-12-09 MED ORDER — ACETAMINOPHEN 325 MG PO TABS
650.0000 mg | ORAL_TABLET | Freq: Four times a day (QID) | ORAL | Status: DC | PRN
  Administered 2012-12-09 – 2012-12-10 (×2): 650 mg via ORAL
  Filled 2012-12-09 (×2): qty 2

## 2012-12-09 NOTE — Progress Notes (Signed)
CSW assisting with d/c planning. Pt has a SNF bed at [facility] if pt is stable for d/c. CSW will follow to assist with d/c planning to SNF.  Cori Razor LCSW [phone]

## 2012-12-09 NOTE — Progress Notes (Signed)
   Subjective: 2 Days Post-Op Procedure(s) (LRB): LEFT TOTAL KNEE ARTHROPLASTY (Left) Patient reports pain as mild.   Patient seen in rounds by Dr. Lequita Halt. Patient is well, and has had no acute complaints or problems Plan is to go Skilled nursing facility after hospital stay.  Objective: Vital signs in last 24 hours: Temp:  [98.7 F (37.1 C)-99.6 F (37.6 C)] 99.6 F (37.6 C) (09/24 1400) Pulse Rate:  [69-82] 82 (09/24 1651) Resp:  [15-16] 15 (09/24 1400) BP: (111-133)/(61-78) 111/65 mmHg (09/24 1651) SpO2:  [94 %-97 %] 97 % (09/24 1400)  Intake/Output from previous day:  Intake/Output Summary (Last 24 hours) at 12/09/12 1727 Last data filed at 12/09/12 1033  Gross per 24 hour  Intake    240 ml  Output      1 ml  Net    239 ml    Intake/Output this shift: Total I/O In: 240 [P.O.:240] Out: -   Labs:  Recent Labs  12/08/12 0351 12/09/12 0445  HGB 10.2* 9.6*    Recent Labs  12/08/12 0351 12/09/12 0445  WBC 10.0 8.4  RBC 3.19* 3.09*  HCT 29.7* 28.8*  PLT 189 172    Recent Labs  12/08/12 0351 12/09/12 0445  NA 138 136  K 4.2 3.5  CL 106 104  CO2 26 25  BUN 12 11  CREATININE 0.89 0.87  GLUCOSE 127* 104*  CALCIUM 9.0 8.9   No results found for this basename: LABPT, INR,  in the last 72 hours  EXAM General - Patient is Alert, Appropriate and Oriented Extremity - Neurovascular intact Sensation intact distally Dorsiflexion/Plantar flexion intact No cellulitis present Dressing/Incision - clean, dry, no drainage, healing Motor Function - intact, moving foot and toes well on exam.   Past Medical History  Diagnosis Date  . Atrial fibrillation   . Dizziness   . Obesity   . PONV (postoperative nausea and vomiting)     however no problems with past surgeries here.  . Asthma   . Hx of seasonal allergies     tx. Allegra, Flonase    Assessment/Plan: 2 Days Post-Op Procedure(s) (LRB): LEFT TOTAL KNEE ARTHROPLASTY (Left) Principal Problem:   OA  (osteoarthritis) of knee Active Problems:   Postoperative anemia due to acute blood loss  Estimated body mass index is 33.21 kg/(m^2) as calculated from the following:   Height as of this encounter: 5\' 8"  (1.727 m).   Weight as of this encounter: 99.054 kg (218 lb 6 oz). Up with therapy Plan for discharge tomorrow Discharge to SNF  DVT Prophylaxis - Xarelto Weight-Bearing as tolerated to left leg  PERKINS, ALEXZANDREW 12/09/2012, 5:27 PM

## 2012-12-09 NOTE — Progress Notes (Signed)
Physical Therapy Treatment Patient Details Name: Donna Moon MRN: [id number] DOB: 11-28-1950 Today's Date: 12/09/2012 Time: 8657-8469 PT Time Calculation (min): 25 min  PT Assessment / Plan / Recommendation  History of Present Illness s/p L TKA   PT Comments   POD # 2 pm session.  Amb pt in hallway second time, assisted to BR then positioned in recliner with ICE.   Follow Up Recommendations  SNF(via car)     Does the patient have the potential to tolerate intense rehabilitation     Barriers to Discharge        Equipment Recommendations  None recommended by PT    Recommendations for Other Services    Frequency 7X/week   Progress towards PT Goals Progress towards PT goals: Progressing toward goals  Plan      Precautions / Restrictions Precautions Precautions: Fall;Knee Precaution Comments: Instructed pt on KI use for amb Required Braces or Orthoses: Knee Immobilizer - Left Knee Immobilizer - Left: Discontinue once straight leg raise with < 10 degree lag Restrictions Weight Bearing Restrictions: No Other Position/Activity Restrictions: WBAT    Pertinent Vitals/Pain C/o 6/10 meds requested ICE applied    Mobility  Bed Mobility Bed Mobility: Not assessed Details for Bed Mobility Assistance: Pt OOB in recliner Transfers Transfers: Sit to Stand;Stand to Sit Sit to Stand: 5: Supervision;4: Min guard;From chair/3-in-1;From toilet Stand to Sit: 5: Supervision;4: Min guard;To chair/3-in-1;To toilet Details for Transfer Assistance: <25% cues for correct hand placement and increased time Ambulation/Gait Ambulation/Gait Assistance: 4: Min guard;5: Supervision Ambulation Distance (Feet): 184 Feet Assistive device: Rolling walker Ambulation/Gait Assistance Details: one VC safety with turns in bathroom Gait Pattern: Step-to pattern;Antalgic    PT Goals (current goals can now be found in the care plan section)    Visit Information  Last PT Received On: 12/09/12 Assistance  Needed: +1 History of Present Illness: s/p L TKA    Subjective Data      Cognition       Balance     End of Session PT - End of Session Equipment Utilized During Treatment: Gait belt;Left knee immobilizer Activity Tolerance: Patient tolerated treatment well Patient left: in chair;with call bell/phone within reach;with family/visitor present   [name]  PTA [facility]  Acute  Rehab Pager      [phone]

## 2012-12-09 NOTE — Progress Notes (Signed)
Physical Therapy Treatment Patient Details Name: Donna Moon MRN: [id number] DOB: Feb 18, 1951 Today's Date: 12/09/2012 Time: 8295-6213 PT Time Calculation (min): 25 min  PT Assessment / Plan / Recommendation  History of Present Illness s/p L TKA   PT Comments   POD # 2 am session.  Amb pt in hallway then performed TKR TE's.  Pt plans to D/C to [facility] for ST Rehab.    Follow Up Recommendations  SNF     Does the patient have the potential to tolerate intense rehabilitation     Barriers to Discharge        Equipment Recommendations  None recommended by PT    Recommendations for Other Services    Frequency 7X/week   Progress towards PT Goals Progress towards PT goals: Progressing toward goals  Plan      Precautions / Restrictions Precautions Precautions: Fall;Knee Precaution Comments: Instructed pt on KI use for amb Required Braces or Orthoses: Knee Immobilizer - Left Knee Immobilizer - Left: Discontinue once straight leg raise with < 10 degree lag Restrictions Weight Bearing Restrictions: No Other Position/Activity Restrictions: WBAT    Pertinent Vitals/Pain C/o 5/10 during TE's. ICE applied    Mobility  Bed Mobility Bed Mobility: Not assessed Details for Bed Mobility Assistance: Pt OOB in recliner Transfers Transfers: Sit to Stand;Stand to Sit Sit to Stand: 5: Supervision;4: Min guard;From chair/3-in-1 Stand to Sit: 5: Supervision;4: Min guard;To chair/3-in-1 Details for Transfer Assistance: <25% cues for correct hand placement and increased time Ambulation/Gait Ambulation/Gait Assistance: 4: Min guard;5: Supervision Ambulation Distance (Feet): 175 Feet Assistive device: Rolling walker Gait Pattern: Step-to pattern;Antalgic    Exercises   Total Knee Replacement TE's 10 reps B LE ankle pumps 10 reps knee presses 10 reps heel slides  10 reps SAQ's 10 reps SLR's 10 reps ABD Followed by ICE   PT Goals (current goals can now be found in the care plan  section)    Visit Information  Last PT Received On: 12/09/12 Assistance Needed: +1 History of Present Illness: s/p L TKA    Subjective Data      Cognition       Balance     End of Session PT - End of Session Equipment Utilized During Treatment: Gait belt;Left knee immobilizer Activity Tolerance: Patient tolerated treatment well Patient left: in chair;with call bell/phone within reach;with family/visitor present   [name]  PTA [facility]  Acute  Rehab Pager      [phone]

## 2012-12-10 ENCOUNTER — Inpatient Hospital Stay
Admission: RE | Admit: 2012-12-10 | Discharge: 2012-12-23 | Disposition: A | Discharge status: Home / Self Care | Payer: Medicare Other | Source: Ambulatory Visit | Attending: Internal Medicine | Admitting: Internal Medicine

## 2012-12-10 LAB — CBC
Hemoglobin: 9.2 g/dL — ABNORMAL LOW (ref 12.0–15.0)
MCHC: 34.2 g/dL (ref 30.0–36.0)
Platelets: 164 10*3/uL (ref 150–400)
RDW: 12.9 % (ref 11.5–15.5)
WBC: 7 10*3/uL (ref 4.0–10.5)

## 2012-12-10 MED ORDER — TRAMADOL HCL 50 MG PO TABS: 50.0000 mg | ORAL_TABLET | Freq: Four times a day (QID) | ORAL | Status: DC | PRN

## 2012-12-10 MED ORDER — BISACODYL 10 MG RE SUPP: 10.0000 mg | Freq: Every day | RECTAL | Status: DC | PRN

## 2012-12-10 MED ORDER — POLYETHYLENE GLYCOL 3350 17 G PO PACK: 17.0000 g | PACK | Freq: Every day | ORAL | Status: DC | PRN

## 2012-12-10 MED ORDER — METOCLOPRAMIDE HCL 5 MG PO TABS: 5.0000 mg | ORAL_TABLET | Freq: Three times a day (TID) | ORAL | Status: DC | PRN

## 2012-12-10 MED ORDER — OXYCODONE HCL 5 MG PO TABS: 5.0000 mg | ORAL_TABLET | ORAL | Status: DC | PRN

## 2012-12-10 MED ORDER — ONDANSETRON HCL 4 MG PO TABS: 4.0000 mg | ORAL_TABLET | Freq: Four times a day (QID) | ORAL | Status: DC | PRN

## 2012-12-10 MED ORDER — DSS 100 MG PO CAPS: 100.0000 mg | ORAL_CAPSULE | Freq: Two times a day (BID) | ORAL | Status: DC

## 2012-12-10 MED ORDER — RIVAROXABAN 10 MG PO TABS: 10.0000 mg | ORAL_TABLET | Freq: Every day | ORAL | Status: DC

## 2012-12-10 MED ORDER — METHOCARBAMOL 500 MG PO TABS: 500.0000 mg | ORAL_TABLET | Freq: Four times a day (QID) | ORAL | Status: DC | PRN

## 2012-12-10 MED ORDER — ACETAMINOPHEN 325 MG PO TABS: 650.0000 mg | ORAL_TABLET | Freq: Four times a day (QID) | ORAL | Status: DC | PRN

## 2012-12-10 NOTE — Progress Notes (Signed)
Physical Therapy Treatment Patient Details Name: Donna Moon MRN: [id number] DOB: Jul 08, 1950 Today's Date: 12/10/2012 Time: 4098-1191 PT Time Calculation (min): 35 min  PT Assessment / Plan / Recommendation  History of Present Illness s/p L TKA   PT Comments   POD # 3 am session.  Amb pt in hallway then performed TKR TE's followed by ICE.  Pt plans to D/C to SNF today for ST Rehab.    Follow Up Recommendations  SNF     Does the patient have the potential to tolerate intense rehabilitation     Barriers to Discharge        Equipment Recommendations  None recommended by PT    Recommendations for Other Services    Frequency 7X/week   Progress towards PT Goals Progress towards PT goals: Progressing toward goals  Plan      Precautions / Restrictions Precautions Precautions: Fall Precaution Comments: Pt able to performactive SLR so instructed on D/C of KI Restrictions Weight Bearing Restrictions: No Other Position/Activity Restrictions: WBAT    Pertinent Vitals/Pain C/o 3/10 pain ICE applied    Mobility  Bed Mobility Bed Mobility: Not assessed Details for Bed Mobility Assistance: Pt OOB in recliner Transfers Transfers: Sit to Stand;Stand to Sit Sit to Stand: 5: Supervision;4: Min guard;From chair/3-in-1 Stand to Sit: 5: Supervision;4: Min guard;To chair/3-in-1 Details for Transfer Assistance: <25% cues for correct hand placement and increased time Ambulation/Gait Ambulation/Gait Assistance: 4: Min guard;5: Supervision Ambulation Distance (Feet): 220 Feet Assistive device: Rolling walker Ambulation/Gait Assistance Details: one VC on safety with backward gait Gait Pattern: Step-to pattern;Antalgic Gait velocity: decreased    Exercises   Total Knee Replacement TE's 10 reps B LE ankle pumps 10 reps knee presses 10 reps heel slides  10 reps SAQ's 10 reps SLR's 10 reps ABD Followed by ICE    PT Goals (current goals can now be found in the care plan section)     Visit Information  Last PT Received On: 12/10/12 Assistance Needed: +1 History of Present Illness: s/p L TKA    Subjective Data      Cognition       Balance     End of Session PT - End of Session Equipment Utilized During Treatment: Gait belt Activity Tolerance: Patient tolerated treatment well Patient left: in chair;with call bell/phone within reach;with family/visitor present   [name]  PTA [facility]  Acute  Rehab Pager      [phone]

## 2012-12-10 NOTE — Progress Notes (Signed)
Report given to [facility] re; patients transfer to [facility], all imporatnt info. Reviewew with her, verbalized understanding.- Hulda Marin RN

## 2012-12-10 NOTE — Progress Notes (Signed)
Clinical Social Work  CSW faxed DC summary to [facility] and agreeable to accept patient today. CSW prepared DC packet with FL2 and hard scripts included. CSW updated RN on DC plans and provided number to call report. Patient and husband aware of DC plans and agreeable for husband to transport to SNF. RN to give patient the packet at DC. CSW is signing off but available if needed.  Unk Lightning, LCSW (Coverage for [employer])

## 2012-12-10 NOTE — Progress Notes (Signed)
   Subjective: 3 Days Post-Op Procedure(s) (LRB): LEFT TOTAL KNEE ARTHROPLASTY (Left) Patient reports pain as mild.   Patient seen in rounds by Dr. Lequita Halt. Patient is well, and has had no acute complaints or problems Patient is ready to go to [facility]  Objective: Vital signs in last 24 hours: Temp:  [98.9 F (37.2 C)-101.1 F (38.4 C)] 99.4 F (37.4 C) (09/25 0516) Pulse Rate:  [69-85] 85 (09/25 0516) Resp:  [15-17] 17 (09/25 0800) BP: (111-120)/(61-72) 120/72 mmHg (09/25 0516) SpO2:  [95 %-98 %] 95 % (09/25 0800)  Intake/Output from previous day:  Intake/Output Summary (Last 24 hours) at 12/10/12 0927 Last data filed at 12/10/12 1610  Gross per 24 hour  Intake    720 ml  Output      0 ml  Net    720 ml    Intake/Output this shift: Total I/O In: 240 [P.O.:240] Out: -   Labs:  Recent Labs  12/08/12 0351 12/09/12 0445 12/10/12 0356  HGB 10.2* 9.6* 9.2*    Recent Labs  12/09/12 0445 12/10/12 0356  WBC 8.4 7.0  RBC 3.09* 2.89*  HCT 28.8* 26.9*  PLT 172 164    Recent Labs  12/08/12 0351 12/09/12 0445  NA 138 136  K 4.2 3.5  CL 106 104  CO2 26 25  BUN 12 11  CREATININE 0.89 0.87  GLUCOSE 127* 104*  CALCIUM 9.0 8.9   No results found for this basename: LABPT, INR,  in the last 72 hours  EXAM: General - Patient is Alert, Appropriate and Oriented Extremity - Neurovascular intact Sensation intact distally Dorsiflexion/Plantar flexion intact Incision - clean, dry, no drainage, healing Motor Function - intact, moving foot and toes well on exam.   Assessment/Plan: 3 Days Post-Op Procedure(s) (LRB): LEFT TOTAL KNEE ARTHROPLASTY (Left) Procedure(s) (LRB): LEFT TOTAL KNEE ARTHROPLASTY (Left) Past Medical History  Diagnosis Date  . Atrial fibrillation   . Dizziness   . Obesity   . PONV (postoperative nausea and vomiting)     however no problems with past surgeries here.  . Asthma   . Hx of seasonal allergies     tx. Allegra, Flonase    Principal Problem:   OA (osteoarthritis) of knee Active Problems:   Postoperative anemia due to acute blood loss  Estimated body mass index is 33.21 kg/(m^2) as calculated from the following:   Height as of this encounter: 5\' 8"  (1.727 m).   Weight as of this encounter: 99.054 kg (218 lb 6 oz). Up with therapy Discharge to SNF Diet - Cardiac diet Follow up - in 2 weeks Activity - WBAT Disposition - Skilled nursing facility Condition Upon Discharge - Good D/C Meds - See DC Summary DVT Prophylaxis - Xarelto  PERKINS, ALEXZANDREW 12/10/2012, 9:27 AM

## 2012-12-10 NOTE — Discharge Summary (Signed)
Physician Discharge Summary   Patient ID: ENIOLA CERULLO MRN: [id number] DOB/AGE: 1950/11/15 62 y.o.  Admit date: 12/07/2012 Discharge date: 12/10/2012  Primary Diagnosis:  Osteoarthritis Left knee(  Admission Diagnoses:  Past Medical History  Diagnosis Date  . Atrial fibrillation   . Dizziness   . Obesity   . PONV (postoperative nausea and vomiting)     however no problems with past surgeries here.  . Asthma   . Hx of seasonal allergies     tx. Allegra, Flonase   Discharge Diagnoses:   Principal Problem:   OA (osteoarthritis) of knee Active Problems:   Postoperative anemia due to acute blood loss  Estimated body mass index is 33.21 kg/(m^2) as calculated from the following:   Height as of this encounter: 5\' 8"  (1.727 m).   Weight as of this encounter: 99.054 kg (218 lb 6 oz).  Procedure:  Procedure(s) (LRB): LEFT TOTAL KNEE ARTHROPLASTY (Left)   Consults: None  HPI: NEVAYA NAGELE is a 62 y.o. year old female with end stage OA of her left knee with progressively worsening pain and dysfunction. She has constant pain, with activity and at rest and significant functional deficits with difficulties even with ADLs. She has had extensive non-op management including analgesics, injections of cortisone and viscosupplements, and home exercise program, but remains in significant pain with significant dysfunction. Radiographs show bone on bone arthritis medial and patellofemoral. She presents now for left Total Knee Arthroplasty.   Laboratory Data: Admission on 12/07/2012  Component Date Value Range Status  . WBC 12/08/2012 10.0  4.0 - 10.5 K/uL Final  . RBC 12/08/2012 3.19* 3.87 - 5.11 MIL/uL Final  . Hemoglobin 12/08/2012 10.2* 12.0 - 15.0 g/dL Final  . HCT 30/86/5784 29.7* 36.0 - 46.0 % Final  . MCV 12/08/2012 93.1  78.0 - 100.0 fL Final  . MCH 12/08/2012 32.0  26.0 - 34.0 pg Final  . MCHC 12/08/2012 34.3  30.0 - 36.0 g/dL Final  . RDW 69/62/9528 12.8  11.5 - 15.5 % Final  .  Platelets 12/08/2012 189  150 - 400 K/uL Final  . Sodium 12/08/2012 138  135 - 145 mEq/L Final  . Potassium 12/08/2012 4.2  3.5 - 5.1 mEq/L Final  . Chloride 12/08/2012 106  96 - 112 mEq/L Final  . CO2 12/08/2012 26  19 - 32 mEq/L Final  . Glucose, Bld 12/08/2012 127* 70 - 99 mg/dL Final  . BUN 41/32/4401 12  6 - 23 mg/dL Final  . Creatinine, Ser 12/08/2012 0.89  0.50 - 1.10 mg/dL Final  . Calcium 02/72/5366 9.0  8.4 - 10.5 mg/dL Final  . GFR calc non Af Amer 12/08/2012 68* >90 mL/min Final  . GFR calc Af Amer 12/08/2012 79* >90 mL/min Final   Comment: (NOTE)                          The eGFR has been calculated using the CKD EPI equation.                          This calculation has not been validated in all clinical situations.                          eGFR's persistently <90 mL/min signify possible Chronic Kidney  Disease.  . WBC 12/09/2012 8.4  4.0 - 10.5 K/uL Final  . RBC 12/09/2012 3.09* 3.87 - 5.11 MIL/uL Final  . Hemoglobin 12/09/2012 9.6* 12.0 - 15.0 g/dL Final  . HCT 16/12/9602 28.8* 36.0 - 46.0 % Final  . MCV 12/09/2012 93.2  78.0 - 100.0 fL Final  . MCH 12/09/2012 31.1  26.0 - 34.0 pg Final  . MCHC 12/09/2012 33.3  30.0 - 36.0 g/dL Final  . RDW 54/11/8117 13.1  11.5 - 15.5 % Final  . Platelets 12/09/2012 172  150 - 400 K/uL Final  . Sodium 12/09/2012 136  135 - 145 mEq/L Final  . Potassium 12/09/2012 3.5  3.5 - 5.1 mEq/L Final  . Chloride 12/09/2012 104  96 - 112 mEq/L Final  . CO2 12/09/2012 25  19 - 32 mEq/L Final  . Glucose, Bld 12/09/2012 104* 70 - 99 mg/dL Final  . BUN 14/78/2956 11  6 - 23 mg/dL Final  . Creatinine, Ser 12/09/2012 0.87  0.50 - 1.10 mg/dL Final  . Calcium 21/30/8657 8.9  8.4 - 10.5 mg/dL Final  . GFR calc non Af Amer 12/09/2012 70* >90 mL/min Final  . GFR calc Af Amer 12/09/2012 81* >90 mL/min Final   Comment: (NOTE)                          The eGFR has been calculated using the CKD EPI equation.                           This calculation has not been validated in all clinical situations.                          eGFR's persistently <90 mL/min signify possible Chronic Kidney                          Disease.  . WBC 12/10/2012 7.0  4.0 - 10.5 K/uL Final  . RBC 12/10/2012 2.89* 3.87 - 5.11 MIL/uL Final  . Hemoglobin 12/10/2012 9.2* 12.0 - 15.0 g/dL Final  . HCT 84/69/6295 26.9* 36.0 - 46.0 % Final  . MCV 12/10/2012 93.1  78.0 - 100.0 fL Final  . MCH 12/10/2012 31.8  26.0 - 34.0 pg Final  . MCHC 12/10/2012 34.2  30.0 - 36.0 g/dL Final  . RDW 28/41/3244 12.9  11.5 - 15.5 % Final  . Platelets 12/10/2012 164  150 - 400 K/uL Final  Hospital Outpatient Visit on 11/30/2012  Component Date Value Range Status  . aPTT 11/30/2012 28  24 - 37 seconds Final  . WBC 11/30/2012 7.1  4.0 - 10.5 K/uL Final  . RBC 11/30/2012 4.22  3.87 - 5.11 MIL/uL Final  . Hemoglobin 11/30/2012 13.3  12.0 - 15.0 g/dL Final  . HCT 03/20/7251 39.5  36.0 - 46.0 % Final  . MCV 11/30/2012 93.6  78.0 - 100.0 fL Final  . MCH 11/30/2012 31.5  26.0 - 34.0 pg Final  . MCHC 11/30/2012 33.7  30.0 - 36.0 g/dL Final  . RDW 66/44/0347 12.9  11.5 - 15.5 % Final  . Platelets 11/30/2012 244  150 - 400 K/uL Final  . Sodium 11/30/2012 140  135 - 145 mEq/L Final  . Potassium 11/30/2012 4.7  3.5 - 5.1 mEq/L Final  . Chloride 11/30/2012 105  96 - 112 mEq/L Final  . CO2 11/30/2012 28  19 - 32 mEq/L Final  . Glucose, Bld 11/30/2012 96  70 - 99 mg/dL Final  . BUN 40/98/1191 15  6 - 23 mg/dL Final  . Creatinine, Ser 11/30/2012 1.11* 0.50 - 1.10 mg/dL Final  . Calcium 47/82/9562 9.6  8.4 - 10.5 mg/dL Final  . Total Protein 11/30/2012 6.9  6.0 - 8.3 g/dL Final  . Albumin 13/10/6576 3.6  3.5 - 5.2 g/dL Final  . AST 46/96/2952 15  0 - 37 U/L Final  . ALT 11/30/2012 14  0 - 35 U/L Final  . Alkaline Phosphatase 11/30/2012 58  39 - 117 U/L Final  . Total Bilirubin 11/30/2012 0.3  0.3 - 1.2 mg/dL Final  . GFR calc non Af Amer 11/30/2012 52* >90 mL/min Final  . GFR  calc Af Amer 11/30/2012 60* >90 mL/min Final   Comment: (NOTE)                          The eGFR has been calculated using the CKD EPI equation.                          This calculation has not been validated in all clinical situations.                          eGFR's persistently <90 mL/min signify possible Chronic Kidney                          Disease.  Marland Kitchen Prothrombin Time 11/30/2012 12.2  11.6 - 15.2 seconds Final  . INR 11/30/2012 0.92  0.00 - 1.49 Final  . ABO/RH(D) 11/30/2012 A POS   Final  . Antibody Screen 11/30/2012 NEG   Final  . Sample Expiration 11/30/2012 12/10/2012   Final  . Color, Urine 11/30/2012 YELLOW  YELLOW Final  . APPearance 11/30/2012 CLEAR  CLEAR Final  . Specific Gravity, Urine 11/30/2012 1.014  1.005 - 1.030 Final  . pH 11/30/2012 6.0  5.0 - 8.0 Final  . Glucose, UA 11/30/2012 NEGATIVE  NEGATIVE mg/dL Final  . Hgb urine dipstick 11/30/2012 TRACE* NEGATIVE Final  . Bilirubin Urine 11/30/2012 NEGATIVE  NEGATIVE Final  . Ketones, ur 11/30/2012 NEGATIVE  NEGATIVE mg/dL Final  . Protein, ur 84/13/2440 NEGATIVE  NEGATIVE mg/dL Final  . Urobilinogen, UA 11/30/2012 0.2  0.0 - 1.0 mg/dL Final  . Nitrite 01/12/2535 NEGATIVE  NEGATIVE Final  . Leukocytes, UA 11/30/2012 NEGATIVE  NEGATIVE Final  . MRSA, PCR 11/30/2012 POSITIVE* NEGATIVE Final   Comment: RESULT CALLED TO, READ BACK BY AND VERIFIED WITH:                          AFTER HOURS 1846 11/30/12 A NAVARRO  . Staphylococcus aureus 11/30/2012 POSITIVE* NEGATIVE Final   Comment:                                 The Xpert SA Assay (FDA                          approved for NASAL specimens                          in patients over 21 years of  age),                          is one component of                          a comprehensive surveillance                          program.  Test performance has                          been validated by [facility] for patients greater                           than or equal to 89 year old.                          It is not intended                          to diagnose infection nor to                          guide or monitor treatment.  . RBC / HPF 11/30/2012 0-2  <3 RBC/hpf Final     X-Rays:No results found.  EKG: Orders placed in visit on 09/30/12  . EKG 12-LEAD     Hospital Course: CALANDRIA MULLINGS is a 62 y.o. who was admitted to [facility]. They were brought to the operating room on 12/07/2012 and underwent Procedure(s): LEFT TOTAL KNEE ARTHROPLASTY.  Patient tolerated the procedure well and was later transferred to the recovery room and then to the orthopaedic floor for postoperative care.  They were given PO and IV analgesics for pain control following their surgery.  They were given 24 hours of postoperative antibiotics of  Anti-infectives   Start     Dose/Rate Route Frequency Ordered Stop   12/07/12 1400  ceFAZolin (ANCEF) IVPB 1 g/50 mL premix     1 g 100 mL/hr over 30 Minutes Intravenous Every 6 hours 12/07/12 1108 12/07/12 2122   12/07/12 0615  ceFAZolin (ANCEF) IVPB 2 g/50 mL premix     2 g 100 mL/hr over 30 Minutes Intravenous On call to O.R. 12/07/12 1610 12/07/12 9604     and started on DVT prophylaxis in the form of Xarelto.   PT and OT were ordered for total joint protocol.  Discharge planning consulted to help with postop disposition and equipment needs.  Patient had a decnt night on the evening of surgery.  They started to get up OOB with therapy on day one. Hemovac drain was pulled without difficulty.  Continued to work with therapy into day two.  Dressing was changed on day two and the incision was healing well.  By day three, the patient had progressed with therapy and meeting their goals.  Incision was healing well.  Patient was seen in rounds and was ready to go to [facility].   Discharge Medications: Prior to Admission medications   Medication Sig Start Date End Date Taking? Authorizing Provider  albuterol (PROVENTIL HFA;VENTOLIN HFA) 108 (90 BASE) MCG/ACT inhaler Inhale 2 puffs into the lungs every 6 (six) hours as needed for wheezing.   Yes Historical Provider, MD  fexofenadine (ALLEGRA) 180 MG tablet Take 180 mg by mouth daily.   Yes Historical Provider, MD  fluticasone (FLONASE) 50 MCG/ACT nasal spray Place 2 sprays into the nose daily.   Yes Historical Provider, MD  lansoprazole (PREVACID) 30 MG capsule Take 30 mg by mouth daily.   Yes Historical Provider, MD  metoprolol tartrate (LOPRESSOR) 25 MG tablet Take 25-50 mg by mouth 2 (two) times daily. 25mg  in the am and 50mg  in the pm   Yes Historical Provider, MD  propafenone (RYTHMOL) 150 MG tablet Take 1 tablet (150 mg total) by mouth every 8 (eight) hours. 10/14/12  Yes Mihai Croitoru, MD  acetaminophen (TYLENOL) 325 MG tablet Take 2 tablets (650 mg total) by mouth every 6 (six) hours as needed. 12/10/12   Romello Hoehn Julien Girt, PA-C  bisacodyl (DULCOLAX) 10 MG suppository Place 1 suppository (10 mg total) rectally daily as needed. 12/10/12   Darcelle Herrada, PA-C  docusate sodium 100 MG CAPS Take 100 mg by mouth 2 (two) times daily. 12/10/12   Wei Poplaski, PA-C  methocarbamol (ROBAXIN) 500 MG tablet Take 1 tablet (500 mg total) by mouth every 6 (six) hours as needed. 12/10/12   Christionna Poland Julien Girt, PA-C  metoCLOPramide (REGLAN) 5 MG tablet Take 1-2 tablets (5-10 mg total) by mouth every 8 (eight) hours as needed (if ondansetron (ZOFRAN) ineffective.). 12/10/12   Chiffon Kittleson, PA-C  ondansetron (ZOFRAN) 4 MG tablet Take 1 tablet (4 mg total) by mouth every 6 (six) hours as needed for nausea. 12/10/12   Chattie Greeson, PA-C  oxyCODONE (OXY IR/ROXICODONE) 5 MG immediate release tablet Take 1-2 tablets (5-10 mg total) by mouth every 3 (three) hours as needed. 12/10/12   Makailah Slavick, PA-C  polyethylene glycol (MIRALAX / GLYCOLAX) packet Take 17 g by mouth daily as needed. 12/10/12   Kitara Hebb Julien Girt, PA-C  rivaroxaban  (XARELTO) 10 MG TABS tablet Take 1 tablet (10 mg total) by mouth daily with breakfast. Take Xarelto for two and a half more weeks, then discontinue Xarelto. Once the patient has completed the Xarelto, they may resume the 325 mg Aspirin. 12/10/12   Jeromie Gainor, PA-C  traMADol (ULTRAM) 50 MG tablet Take 1-2 tablets (50-100 mg total) by mouth every 6 (six) hours as needed (mild pain). 12/10/12   Winthrop Shannahan Julien Girt, PA-C   Discharge to SNF  Diet - Cardiac diet  Follow up - in 2 weeks  Activity - WBAT  Disposition - Skilled nursing facility  Condition Upon Discharge - Good  D/C Meds - See DC Summary  DVT Prophylaxis - Xarelto   Discharge Orders   Future Appointments Provider Department Dept Phone   12/18/2012 10:30 AM Melony Overly, MD [facility] [phone]   Future Orders Complete By Expires   Call MD / Call 911  As directed    Comments:     If you experience chest pain or shortness of breath, CALL 911 and be transported to the hospital emergency room.  If you develope a fever above 101 F, pus (white drainage) or increased drainage or redness at the wound, or calf pain, call your surgeon's office.   Change dressing  As directed    Comments:     Change dressing daily with sterile 4 x 4 inch gauze dressing and apply TED hose. Do not submerge the incision under  water.   Constipation Prevention  As directed    Comments:     Drink plenty of fluids.  Prune juice may be helpful.  You may use a stool softener, such as Colace (over the counter) 100 mg twice a day.  Use MiraLax (over the counter) for constipation as needed.   Diet - low sodium heart healthy  As directed    Discharge instructions  As directed    Comments:     Pick up stool softner and laxative for home. Do not submerge incision under water. May shower. Continue to use ice for pain and swelling from surgery. Hip precautions.  Total Hip Protocol.  Take Xarelto for two and a half more weeks, then  discontinue Xarelto.   Do not put a pillow under the knee. Place it under the heel.  As directed    Do not sit on low chairs, stoools or toilet seats, as it may be difficult to get up from low surfaces  As directed    Driving restrictions  As directed    Comments:     No driving until released by the physician.   Increase activity slowly as tolerated  As directed    Lifting restrictions  As directed    Comments:     No lifting until released by the physician.   Patient may shower  As directed    Comments:     You may shower without a dressing once there is no drainage.  Do not wash over the wound.  If drainage remains, do not shower until drainage stops.   TED hose  As directed    Comments:     Use stockings (TED hose) for 3 weeks on both leg(s).  You may remove them at night for sleeping.   Weight bearing as tolerated  As directed        Medication List    STOP taking these medications       aspirin 325 MG tablet     CALCIUM 600+D PO     ibuprofen 800 MG tablet  Commonly known as:  ADVIL,MOTRIN      TAKE these medications       acetaminophen 325 MG tablet  Commonly known as:  TYLENOL  Take 2 tablets (650 mg total) by mouth every 6 (six) hours as needed.     albuterol 108 (90 BASE) MCG/ACT inhaler  Commonly known as:  PROVENTIL HFA;VENTOLIN HFA  Inhale 2 puffs into the lungs every 6 (six) hours as needed for wheezing.     bisacodyl 10 MG suppository  Commonly known as:  DULCOLAX  Place 1 suppository (10 mg total) rectally daily as needed.     DSS 100 MG Caps  Take 100 mg by mouth 2 (two) times daily.     fexofenadine 180 MG tablet  Commonly known as:  ALLEGRA  Take 180 mg by mouth daily.     fluticasone 50 MCG/ACT nasal spray  Commonly known as:  FLONASE  Place 2 sprays into the nose daily.     lansoprazole 30 MG capsule  Commonly known as:  PREVACID  Take 30 mg by mouth daily.     methocarbamol 500 MG tablet  Commonly known as:  ROBAXIN  Take 1 tablet  (500 mg total) by mouth every 6 (six) hours as needed.     metoCLOPramide 5 MG tablet  Commonly known as:  REGLAN  Take 1-2 tablets (5-10 mg total) by mouth every 8 (eight) hours as  needed (if ondansetron (ZOFRAN) ineffective.).     metoprolol tartrate 25 MG tablet  Commonly known as:  LOPRESSOR  Take 25-50 mg by mouth 2 (two) times daily. 25mg  in the am and 50mg  in the pm     ondansetron 4 MG tablet  Commonly known as:  ZOFRAN  Take 1 tablet (4 mg total) by mouth every 6 (six) hours as needed for nausea.     oxyCODONE 5 MG immediate release tablet  Commonly known as:  Oxy IR/ROXICODONE  Take 1-2 tablets (5-10 mg total) by mouth every 3 (three) hours as needed.     polyethylene glycol packet  Commonly known as:  MIRALAX / GLYCOLAX  Take 17 g by mouth daily as needed.     propafenone 150 MG tablet  Commonly known as:  RYTHMOL  Take 1 tablet (150 mg total) by mouth every 8 (eight) hours.     rivaroxaban 10 MG Tabs tablet  Commonly known as:  XARELTO  - Take 1 tablet (10 mg total) by mouth daily with breakfast. Take Xarelto for two and a half more weeks, then discontinue Xarelto.  - Once the patient has completed the Xarelto, they may resume the 325 mg Aspirin.     traMADol 50 MG tablet  Commonly known as:  ULTRAM  Take 1-2 tablets (50-100 mg total) by mouth every 6 (six) hours as needed (mild pain).           Follow-up Information   Follow up with Loanne Drilling, MD. Schedule an appointment as soon as possible for a visit in 2 weeks.   Specialty:  Orthopedic Surgery   Contact information:   [address] [place] [address] [phone]       Signed: Patrica Duel 12/10/2012, 9:35 AM  .

## 2012-12-10 NOTE — Progress Notes (Signed)
D/cinstructions given, patient verbalized understanding. No c/o pain at this time.Hulda Marin RN

## 2012-12-10 NOTE — Progress Notes (Signed)
Patient d/c home,stable.- Fern Asmar RN 

## 2012-12-11 ENCOUNTER — Other Ambulatory Visit: Payer: Self-pay | Admitting: *Deleted

## 2012-12-11 MED ORDER — TRAMADOL HCL 50 MG PO TABS: 50.0000 mg | ORAL_TABLET | Freq: Four times a day (QID) | ORAL | Status: DC | PRN

## 2012-12-11 MED ORDER — OXYCODONE HCL 5 MG PO TABS: 5.0000 mg | ORAL_TABLET | ORAL | Status: DC | PRN

## 2012-12-14 ENCOUNTER — Non-Acute Institutional Stay (SKILLED_NURSING_FACILITY): Payer: Medicare Other | Admitting: Internal Medicine

## 2012-12-14 DIAGNOSIS — Z96659 Presence of unspecified artificial knee joint: Secondary | ICD-10-CM

## 2012-12-14 DIAGNOSIS — Z96652 Presence of left artificial knee joint: Secondary | ICD-10-CM

## 2012-12-14 DIAGNOSIS — I4891 Unspecified atrial fibrillation: Secondary | ICD-10-CM

## 2012-12-14 DIAGNOSIS — J45909 Unspecified asthma, uncomplicated: Secondary | ICD-10-CM

## 2012-12-14 NOTE — Progress Notes (Signed)
Next there Patient ID: Donna Moon, female   DOB: [birth year], 61 y.o.   MRN: [id number]  Facility; [facility] Chief complaint; admission to SNF post admit to [facility] from September 22 to September 25  History ; this is a 62 year old woman who was admitted electively for a left total knee replacement with worsening pain and dysfunction in spite of conservative measures. She appears to have tolerated the surgery reasonably well although she is listed as having postoperative nausea and vomiting. She is concerned about the degree of swelling in the left knee and is worried about an infection.  Past Medical History  Diagnosis Date  . Atrial fibrillation post AV nodal ablation x2 per the patient. She is on an adult aspirin at home    . Dizziness   . Obesity   . PONV (postoperative nausea and vomiting)     however no problems with past surgeries here.  . Asthma; has a when necessary inhaler which she has used one time in the last 3 months one hospitalization related to this in the past. One ICU trip no intubations    . Hx of seasonal allergies     tx. [place]   Past Surgical History  Procedure Laterality Date  . Carpel tunnel  1993  . Appendectomy  1968  . Oophorectomy  2009  . Cardiac electrophysiology study and ablation  1997 & 2011  . Cesarean section  1986  . Total hip arthroplasty  12/27/2010    x2 replacements, x1 repair.  . Rotator cuff repair  1999    x2 -'97, 99  . Joint replacement Left     x2  . Total knee arthroplasty Left 12/07/2012    Procedure: LEFT TOTAL KNEE ARTHROPLASTY;  Surgeon: Loanne Drilling, MD;  Location: [facility];  Service: Orthopedics;  Laterality: Left;   Medications; Allegra 180 mg a day, Flonase 2 sprays into the nose daily, Prevacid 30 mg daily, Lopressor 25 g in the morning and 50 in the afternoon, Rythmol 150 every 8 hours, xarelto for DVT prophylaxis 10 mg  Social history; patient lives with her husband and is functional.  reports that she has quit  smoking. She quit smokeless tobacco use about 24 years ago. She reports that she does not drink alcohol or use illicit drugs.  family history includes Diabetes in her sister and sister; Heart attack in her brother and father; Stroke in her mother.  Review of systems; Respiratory; she does not describe cough or shortness of breath Cardiac no exertional chest pain. States she still has intermittent paroxysmal atrial fibrillation GI no abdominal pain no change in bowel habits GU no voiding difficulties. 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 Right knee is in fairly good shape. She is awaiting a left shoulder replacement  Physical examination Gen. patient is in no distress. Pulse rate 64 and regular respirations 18 Respiratory clear entry bilaterally no wheezing Cardiac heart sounds are normal no murmurs no gallops Abdomen; soft no masses no tenderness GU no bladder distention Musculoskeletal; left knee incision looks very clean. Some soft tissue bruising which does not look all that rheumatic. I've reassured the patient there is absolutely no evidence of infection here. 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 This will add an additional wrinkles to her rehabilitation process.  Impression/plan #1 status post left total knee replacement. She is on xarelto for DVT prophylaxis. Everything appears to be fine here. #2 atrial fibrillation  on propafenone . She'll restart her on her aspirin once or xarelto is finished. 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 #4 asthma; she already has when necessary albuterol

## 2012-12-18 ENCOUNTER — Ambulatory Visit: Payer: Self-pay | Admitting: Obstetrics and Gynecology

## 2012-12-22 ENCOUNTER — Non-Acute Institutional Stay (SKILLED_NURSING_FACILITY): Payer: Medicare Other | Admitting: Internal Medicine

## 2012-12-22 DIAGNOSIS — M1710 Unilateral primary osteoarthritis, unspecified knee: Secondary | ICD-10-CM

## 2012-12-22 DIAGNOSIS — D62 Acute posthemorrhagic anemia: Secondary | ICD-10-CM

## 2012-12-22 DIAGNOSIS — I480 Paroxysmal atrial fibrillation: Secondary | ICD-10-CM

## 2012-12-22 DIAGNOSIS — J45909 Unspecified asthma, uncomplicated: Secondary | ICD-10-CM

## 2012-12-22 DIAGNOSIS — IMO0002 Reserved for concepts with insufficient information to code with codable children: Secondary | ICD-10-CM

## 2012-12-22 DIAGNOSIS — I4891 Unspecified atrial fibrillation: Secondary | ICD-10-CM

## 2012-12-22 NOTE — Progress Notes (Signed)
Patient ID: Donna Moon, female   DOB: 1951-02-12, 62 y.o.   MRN: [id number]   This is a discharge note.  Level care skilled Facility; [facility]  Chief complaint; discharge note   History ; this is a 62 year old woman who was admitted electively for a left total knee replacement with worsening pain and dysfunction in spite of conservative measures. She appears to have tolerated the surgery reasonably well although she is listed as having postoperative nausea and vomiting  She has rehabbed well is walking quite well with a walker now-he will be going home and will need outpatient therapy.  Her pain at this point appears to be controlled on oxycodone as well as Ultram when necessary.  She has no acute complaints tonight.  Vital signs have been stable she is afebrile     Past Medical History   Diagnosis  Date   .  Atrial fibrillation post AV nodal ablation x2 per the patient. She is on an adult aspirin at home      .  Dizziness     .  Obesity     .  PONV (postoperative nausea and vomiting)         however no problems with past surgeries here.   .  Asthma; has a when necessary inhaler which she has used one time in the last 3 months one hospitalization related to this in the past. One ICU trip no intubations      .  Hx of seasonal allergies         tx. [place]       Past Surgical History   Procedure  Laterality  Date   .  Carpel tunnel    1993   .  Appendectomy    1968   .  Oophorectomy    2009   .  Cardiac electrophysiology study and ablation    1997 & 2011   .  Cesarean section    1986   .  Total hip arthroplasty    12/27/2010       x2 replacements, x1 repair.   .  Rotator cuff repair    1999       x2 -'97, 99   .  Joint replacement  Left         x2   .  Total knee arthroplasty  Left  12/07/2012       Procedure: LEFT TOTAL KNEE ARTHROPLASTY;  Surgeon: Loanne Drilling, MD;  Location: [facility];  Service: Orthopedics;  Laterality: Left;      Medications; Reviewed per  [facility]   Social history; patient lives with her husband and is functional.  reports that she has quit smoking. She quit smokeless tobacco use about 24 years ago. She reports that she does not drink alcohol or use illicit drugs.   family history includes Diabetes in her sister and sister; Heart attack in her brother and father; Stroke in her mother.   Review of systems; In general says she's feeling quite well no acute complaints Eyes does not complaining of any visual changes.  Your nose mouth and throat-no complaints of sore throat dysphagia Respiratory; she does not describe cough or shortness of breath Cardiac no exertional chest pain. States she still has intermittent paroxysmal atrial fibrillation GI no abdominal pain no change in bowel habits GU no voiding difficulties. 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 Right knee is in fairly good shape. She is  awaiting a left shoulder replacement  her pain is quite well controlled  Neurologic-does not complaining of any numbness or headache.  Psych-appears to be in very good spirits does not show any signs of depression or anxiety as he   Physical examination  Temperature 97.1 pulse 68 respirations 20 blood pressure 113/72 weight is 211 this is down about 8 pounds since admission although I suspect this is desired Gen. patient is in no distress \ It is warm and dry surgical site appears to be healing unremarkably.  Eyes-pupils equal round react to light sclera and conjunctiva are clear visual acuity appears intact.  Oropharynx clear mucous membranes moist.   Respiratory clear entry bilaterally no wheezing Cardiac heart sounds are normal no murmurs no gallops Abdomen; soft no masses no tenderness GU no bladder distention Musculoskeletal; left knee incision appears unremarkable well-healed surgical scar.  She is ambulating well with a walker  Pedal pulse is  intact--.  Neurologic is grossly intact no focal deficits cranial nerves grossly intact speech is clear.  Psych-is alert and oriented x3 very pleasant l  Labs.  12/10/2012.  WBC 7.0 hemoglobin 9.2 platelets 164.  12/09/2012.  Sodium 136 potassium 3.5 BUN 11 creatinine 0.87.  11/30/2012.  Liver function tests-within normal limits      Impression/plan #1 status post left total knee replacement--secondary to end-stage osteoarthritis--. Everything appears to be fine here she is ambulating well with a walker-will need orthopedic followup in outpatient therapy-she is now on aspirin for anticoagulation. #2 atrial fibrillation on propafenone . This appears to be rate controlled she is on Lopressor.. #3 asthma; she already has when necessary albuterol--this has not been an issue during her stay here.  #4-anemia-this was likely postop will update CBC before discharge notify primary care provider of result  #5-history of allergic rhinitis-she is on Flonase this appears to be well controlled currently she is also on Allegra.  #6-pain management-this appears stable on the oxycodone and Ultram will write prescription before discharge. 7-- Constipation as this does not appear to be an issue currently she is on numerous agents.  #8-nausea-this appears resolved she has when necessary Zofran but has not really needed this  .  [id number] note 30 minutes then assessing patient-reviewing her records-incoordinated and formulating a plan of care for numerous diagnoses

## 2013-01-21 ENCOUNTER — Other Ambulatory Visit: Payer: Self-pay

## 2013-02-01 ENCOUNTER — Ambulatory Visit (INDEPENDENT_AMBULATORY_CARE_PROVIDER_SITE_OTHER): Payer: Medicare Other | Admitting: Cardiovascular Disease

## 2013-02-01 ENCOUNTER — Encounter: Payer: Self-pay | Admitting: Cardiovascular Disease

## 2013-02-01 VITALS — BP 128/72 | HR 66 | Resp 16 | Ht 68.0 in | Wt 218.8 lb

## 2013-02-01 DIAGNOSIS — I4891 Unspecified atrial fibrillation: Secondary | ICD-10-CM

## 2013-02-01 DIAGNOSIS — I480 Paroxysmal atrial fibrillation: Secondary | ICD-10-CM

## 2013-02-01 NOTE — Patient Instructions (Addendum)
Your physician recommends that you schedule a follow-up appointment in: 6 months. No changes were made in your therapy today. 

## 2013-02-08 ENCOUNTER — Encounter: Payer: Self-pay | Admitting: Cardiovascular Disease

## 2013-02-08 NOTE — Progress Notes (Signed)
Patient ID: Donna Moon, female   DOB: March 18, 1951, 62 y.o.   MRN: [id number]      Reason for office visit PAF  Donna Moon has done well since he last appointment, without symptoms of arrhythmia. She is recovering well, roughly 2 months after left knee replacement. She had RF ablation for PAf in 2006 and 2011. She has frequent palpitations still. Although we have not recently documented AF, she has substantial symptom relief with propafenone.  Allergies  Allergen Reactions  . Lexapro [Escitalopram Oxalate]     Flu like symptoms     Current Outpatient Prescriptions  Medication Sig Dispense Refill  . acetaminophen (TYLENOL) 325 MG tablet Take 2 tablets (650 mg total) by mouth every 6 (six) hours as needed.  60 tablet  0  . albuterol (PROVENTIL HFA;VENTOLIN HFA) 108 (90 BASE) MCG/ACT inhaler Inhale 2 puffs into the lungs every 6 (six) hours as needed for wheezing.      Marland Kitchen aspirin 325 MG tablet Take 325 mg by mouth daily.      . bisacodyl (DULCOLAX) 10 MG suppository Place 1 suppository (10 mg total) rectally daily as needed.  12 suppository  0  . Calcium Carb-Cholecalciferol (CALCIUM 600 + D PO) Take 1 tablet by mouth daily.      . fexofenadine (ALLEGRA) 180 MG tablet Take 180 mg by mouth daily.      . fluticasone (FLONASE) 50 MCG/ACT nasal spray Place 2 sprays into the nose daily.      . methocarbamol (ROBAXIN) 500 MG tablet Take 500 mg by mouth at bedtime.      . metoprolol (LOPRESSOR) 50 MG tablet Take 50 mg by mouth 2 (two) times daily.      . propafenone (RYTHMOL) 150 MG tablet Take 1 tablet (150 mg total) by mouth every 8 (eight) hours.  90 tablet  11  . traMADol (ULTRAM) 50 MG tablet Take 1-2 tablets (50-100 mg total) by mouth every 6 (six) hours as needed (mild pain).  240 tablet  5  . lansoprazole (PREVACID) 30 MG capsule Take 30 mg by mouth daily.       No current facility-administered medications for this visit.    Past Medical History  Diagnosis Date  . Atrial fibrillation     . Dizziness   . Obesity   . PONV (postoperative nausea and vomiting)     however no problems with past surgeries here.  . Asthma   . Hx of seasonal allergies     tx. [place]    Past Surgical History  Procedure Laterality Date  . Carpel tunnel  1993  . Appendectomy  1968  . Oophorectomy  2009  . Cardiac electrophysiology study and ablation  1997 & 2011  . Cesarean section  1986  . Total hip arthroplasty  12/27/2010    x2 replacements, x1 repair.  . Rotator cuff repair  1999    x2 -'97, 99  . Joint replacement Left     x2  . Total knee arthroplasty Left 12/07/2012    Procedure: LEFT TOTAL KNEE ARTHROPLASTY;  Surgeon: Loanne Drilling, MD;  Location: [facility];  Service: Orthopedics;  Laterality: Left;    Family History  Problem Relation Age of Onset  . Stroke Mother   . Heart attack Father   . Heart attack Brother   . Diabetes Sister   . Diabetes Sister     History   Social History  . Marital Status: Married    Spouse Name: N/A  Number of Children: N/A  . Years of Education: N/A   Occupational History  . Not on file.   Social History Main Topics  . Smoking status: Former Games developer  . Smokeless tobacco: Former Neurosurgeon    Quit date: 03/17/1988  . Alcohol Use: No  . Drug Use: No  . Sexual Activity: Yes   Other Topics Concern  . Not on file   Social History Narrative  . No narrative on file    Review of systems: The patient specifically denies any chest pain at rest or with exertion, dyspnea at rest or with exertion, orthopnea, paroxysmal nocturnal dyspnea, syncope, palpitations, focal neurological deficits, intermittent claudication, lower extremity ede, QTC 436 ms.ma, unexplained weight gain, cough, hemoptysis or wheezing.  The patient also denies abdominal pain, nausea, vomiting, dysphagia, diarrhea, constipation, polyuria, polydipsia, dysuria, hematuria, frequency, urgency, abnormal bleeding or bruising, fever, chills, unexpected weight changes, mood  swings, change in skin or hair texture, change in voice quality, auditory or visual problems, allergic reactions or rashes, new musculoskeletal complaints other than usual "aches and pains".   PHYSICAL EXAM BP 128/72  Pulse 66  Resp 16  Ht 5\' 8"  (1.727 m)  Wt 218 lb 12.8 oz (99.247 kg)  BMI 33.28 kg/m2  General: Alert, oriented x3, no distress Head: no evidence of trauma, PERRL, EOMI, no exophtalmos or lid lag, no myxedema, no xanthelasma; normal ears, nose and oropharynx Neck: normal jugular venous pulsations and no hepatojugular reflux; brisk carotid pulses without delay and no carotid bruits Chest: clear to auscultation, no signs of consolidation by percussion or palpation, normal fremitus, symmetrical and full respiratory excursions Cardiovascular: normal position and quality of the apical impulse, regular rhythm, normal first and second heart sounds, no murmurs, rubs or gallops Abdomen: no tenderness or distention, no masses by palpation, no abnormal pulsatility or arterial bruits, normal bowel sounds, no hepatosplenomegaly Extremities: no clubbing, cyanosis or edema; 2+ radial, ulnar and brachial pulses bilaterally; 2+ right femoral, posterior tibial and dorsalis pedis pulses; 2+ left femoral, posterior tibial and dorsalis pedis pulses; no subclavian or femoral bruits Neurological: grossly nonfocal   EKG: NSR, minor IVCD  Lipid Panel  No results found for this basename: chol, trig, hdl, cholhdl, vldl, ldlcalc    BMET    Component Value Date/Time   NA 136 12/09/2012 0445   K 3.5 12/09/2012 0445   CL 104 12/09/2012 0445   CO2 25 12/09/2012 0445   GLUCOSE 104* 12/09/2012 0445   BUN 11 12/09/2012 0445   CREATININE 0.87 12/09/2012 0445   CALCIUM 8.9 12/09/2012 0445   GFRNONAA 70* 12/09/2012 0445   GFRAA 81* 12/09/2012 0445     ASSESSMENT AND PLAN Paroxysmal atrial fibrillation Well tolerated antiarrhythmic therapy without side effects or ECG high risk features. CHADS2 Vasc2 score  remains relatively low and no recent documentation of AF. Continue aspirin and propafenone.   Patient Instructions  Your physician recommends that you schedule a follow-up appointment in: 6 months. No changes were made in your therapy today.    Orders Placed This Encounter  Procedures  . EKG 12-Lead   Meds ordered this encounter  Medications  . metoprolol (LOPRESSOR) 50 MG tablet    Sig: Take 50 mg by mouth 2 (two) times daily.  Marland Kitchen aspirin 325 MG tablet    Sig: Take 325 mg by mouth daily.  . methocarbamol (ROBAXIN) 500 MG tablet    Sig: Take 500 mg by mouth at bedtime.  . Calcium Carb-Cholecalciferol (CALCIUM 600 + D PO)  Sig: Take 1 tablet by mouth daily.    Junious Silk, MD, [facility] [phone] office [phone] pager

## 2013-02-08 NOTE — Assessment & Plan Note (Signed)
Well tolerated antiarrhythmic therapy without side effects or ECG high risk features. CHADS2 Vasc2 score remains relatively low and no recent documentation of AF. Continue aspirin and propafenone.

## 2013-02-09 ENCOUNTER — Encounter: Payer: Self-pay | Admitting: Cardiovascular Disease

## 2013-06-10 ENCOUNTER — Ambulatory Visit (INDEPENDENT_AMBULATORY_CARE_PROVIDER_SITE_OTHER): Payer: Medicare Other | Admitting: Obstetrics and Gynecology

## 2013-06-10 ENCOUNTER — Encounter: Payer: Self-pay | Admitting: Obstetrics and Gynecology

## 2013-06-10 VITALS — BP 140/80 | HR 80 | Resp 16 | Ht 66.0 in | Wt 226.0 lb

## 2013-06-10 DIAGNOSIS — N952 Postmenopausal atrophic vaginitis: Secondary | ICD-10-CM

## 2013-06-10 DIAGNOSIS — Z01419 Encounter for gynecological examination (general) (routine) without abnormal findings: Secondary | ICD-10-CM

## 2013-06-10 DIAGNOSIS — Z124 Encounter for screening for malignant neoplasm of cervix: Secondary | ICD-10-CM

## 2013-06-10 MED ORDER — ESTROGENS, CONJUGATED 0.625 MG/GM VA CREA: TOPICAL_CREAM | VAGINAL | Status: DC

## 2013-06-10 NOTE — Progress Notes (Signed)
Patient ID: Donna Moon, female   DOB: 11/07/1950, 63 y.o.   MRN: [id number] GYNECOLOGY VISIT  PCP:   [facility]  Referring provider:   HPI: 63 y.o.   Married  Caucasian  female   G1P1001 with Patient's last menstrual period was 03/18/2001.   here for  AEX. Having burning, itching, and pain with intercourse. Bleeds with intercourse.  Did not use estrogen cream that Dr. Joan Flores prescribed last year.   Had a total knee replacement 5 - 6 months ago. Planning a left should replacement.   Has atrial fibrillation and takes an aspirin a day.   Has a history of possible lupus.  Sister died at age 42 due to lupus.   Hgb:    PCP Urine:  PCP  GYNECOLOGIC HISTORY: Patient's last menstrual period was 03/18/2001. Sexually active:  yes Partner preference: female Contraception:   postmenopausal Menopausal hormone therapy: no DES exposure: no   Blood transfusions:   Age 61--pt. Unsure of reason Sexually transmitted diseases: no   GYN procedures and prior surgeries: C-section 1986, Bilateral salpingo-oophorectomy--endometrioma  Last mammogram: 08/2012 wnl in [place], [place]                Last pap and high risk HPV testing: 08-07-10 wnl:no HR HPV testing  History of abnormal pap smear:no     OB History   Grav Para Term Preterm Abortions TAB SAB Ect Mult Living   1 1 1       1        LIFESTYLE: Exercise:   Cardio and water aerobics            Tobacco:    no Alcohol:       no Drug use:    no  OTHER HEALTH MAINTENANCE: Tetanus/TDap:   2014 Gardisil:               n/a Influenza:             12/2012 Zostavax:             Completed with PCP  Bone density:     never Colonoscopy:    2009 wnl in [place], [place].  Next colonoscopy due this year.  Cholesterol check:  04/2012 wnl  Family History  Problem Relation Age of Onset  . Stroke Mother   . Cancer Mother 28    dec uterine or ovarian ca  . Ovarian cancer Mother   . Heart attack Father   . Heart attack Brother    . Diabetes Sister     AODM  . Diabetes Sister     AODM  . Diabetes Maternal Grandmother   . Diabetes Paternal Grandmother     Patient Active Problem List   Diagnosis Date Noted  . Unspecified asthma(493.90) 12/22/2012  . Postoperative anemia due to acute blood loss 12/08/2012  . OA (osteoarthritis) of knee 12/07/2012  . Paroxysmal atrial fibrillation 09/30/2012  . Obesity (BMI 30-39.9) 09/30/2012  . Orthostatic hypotension 09/30/2012   Past Medical History  Diagnosis Date  . Atrial fibrillation   . Dizziness   . Obesity   . PONV (postoperative nausea and vomiting)     however no problems with past surgeries here.  . Asthma   . Hx of seasonal allergies     tx. Allegra, Flonase  . Reflux   . Blood transfusion without reported diagnosis     as a child--age 61  . Dyspareunia   . Osteoporosis     Past Surgical  History  Procedure Laterality Date  . Carpel tunnel  1993  . Appendectomy  1968  . Oophorectomy  2009  . Cardiac electrophysiology study and ablation  1997 & 2011  . Cesarean section  1986  . Total hip arthroplasty  12/27/2010    x2 replacements, x1 repair.  . Rotator cuff repair  1999    x2 -'97, 99  . Joint replacement Left     x2  . Total knee arthroplasty Left 12/07/2012    Procedure: LEFT TOTAL KNEE ARTHROPLASTY;  Surgeon: Gearlean Alf, MD;  Location: [facility];  Service: Orthopedics;  Laterality: Left;  . Pelvic laparoscopy      laparoscopic BSO--endometrioma    ALLERGIES: Lexapro and Naprosyn  Current Outpatient Prescriptions  Medication Sig Dispense Refill  . acetaminophen (TYLENOL) 325 MG tablet Take 2 tablets (650 mg total) by mouth every 6 (six) hours as needed.  60 tablet  0  . albuterol (PROVENTIL HFA;VENTOLIN HFA) 108 (90 BASE) MCG/ACT inhaler Inhale 2 puffs into the lungs every 6 (six) hours as needed for wheezing.      Marland Kitchen aspirin 325 MG tablet Take 325 mg by mouth daily.      . Calcium Carb-Cholecalciferol (CALCIUM 600 + D PO) Take 1 tablet  by mouth daily.      . fexofenadine (ALLEGRA) 180 MG tablet Take 180 mg by mouth daily.      . fluticasone (FLONASE) 50 MCG/ACT nasal spray Place 2 sprays into the nose daily.      Marland Kitchen ibuprofen (ADVIL,MOTRIN) 800 MG tablet Take 800 mg by mouth every 8 (eight) hours as needed.      . lansoprazole (PREVACID) 30 MG capsule Take 30 mg by mouth daily.      . metoprolol (LOPRESSOR) 50 MG tablet Take 50 mg by mouth 2 (two) times daily.      . propafenone (RYTHMOL) 150 MG tablet Take 1 tablet (150 mg total) by mouth every 8 (eight) hours.  90 tablet  11   No current facility-administered medications for this visit.     ROS:  Pertinent items are noted in HPI.  SOCIAL HISTORY:  Retired from school system in 2012.   PHYSICAL EXAMINATION:    BP 140/80  Pulse 80  Resp 16  Ht 5\' 6"  (1.676 m)  Wt 226 lb (102.513 kg)  BMI 36.49 kg/m2  LMP 03/18/2001   Wt Readings from Last 3 Encounters:  06/10/13 226 lb (102.513 kg)  02/01/13 218 lb 12.8 oz (99.247 kg)  12/07/12 218 lb 6 oz (99.054 kg)     Ht Readings from Last 3 Encounters:  06/10/13 5\' 6"  (1.676 m)  02/01/13 5\' 8"  (1.727 m)  12/07/12 5\' 8"  (1.727 m)    General appearance: alert, cooperative and appears stated age Head: Normocephalic, without obvious abnormality, atraumatic Neck: no adenopathy, supple, symmetrical, trachea midline and thyroid not enlarged, symmetric, no tenderness/mass/nodules Lungs: clear to auscultation bilaterally Breasts: Inspection negative, No nipple retraction or dimpling, No nipple discharge or bleeding, No axillary or supraclavicular adenopathy, Normal to palpation without dominant masses Heart: regular rate and rhythm Abdomen: soft, non-tender; no masses,  no organomegaly Extremities: extremities normal, atraumatic, no cyanosis or edema Skin: Skin color, texture, turgor normal. No rashes or lesions Lymph nodes: Cervical, supraclavicular, and axillary nodes normal. No abnormal inguinal nodes  palpated Neurologic: Grossly normal  Pelvic: External genitalia:  no lesions              Urethra:  normal appearing urethra with  no masses, tenderness or lesions              Bartholins and Skenes: normal                 Vagina: erythema and orange discharge.              Cervix: normal appearance              Pap and high risk HPV testing done: yes.            Bimanual Exam:  Uterus:  uterus is normal size, shape, consistency and nontender                                      Adnexa: normal adnexa in size, nontender and no masses                                      Rectovaginal: Confirms                                      Anus:  normal sphincter tone, no lesions  ASSESSMENT  Normal gynecologic exam. Status post BSO.  Atrophic vaginitis.  History of atrial fibrillation. Family history of mother with reproductive cancer.   PLAN  Mammogram recommended yearly.  Pap smear and high risk HPV testing performed both by patient request.  Counseled on self breast exam, Calcium and vitamin D intake, exercise. Declines bone density.  Premarin vaginal cream 1/2 gram pv at hs for 2 weeks and then 1/2 gm pv at hs twice weekly.  See EPIC orders.  I discussed risks of stroke, MI, PE, DVT, and breast cancer.  Return annually or prn   An After Visit Summary was printed and given to the patient.

## 2013-06-10 NOTE — Patient Instructions (Addendum)
EXERCISE AND DIET:  We recommended that you start or continue a regular exercise program for good health. Regular exercise means any activity that makes your heart beat faster and makes you sweat.  We recommend exercising at least 30 minutes per day at least 3 days a week, preferably 4 or 5.  We also recommend a diet low in fat and sugar.  Inactivity, poor dietary choices and obesity can cause diabetes, heart attack, stroke, and kidney damage, among others.    ALCOHOL AND SMOKING:  Women should limit their alcohol intake to no more than 7 drinks/beers/glasses of wine (combined, not each!) per week. Moderation of alcohol intake to this level decreases your risk of breast cancer and liver damage. And of course, no recreational drugs are part of a healthy lifestyle.  And absolutely no smoking or even second hand smoke. Most people know smoking can cause heart and lung diseases, but did you know it also contributes to weakening of your bones? Aging of your skin?  Yellowing of your teeth and nails?  CALCIUM AND VITAMIN D:  Adequate intake of calcium and Vitamin D are recommended.  The recommendations for exact amounts of these supplements seem to change often, but generally speaking 600 mg of calcium (either carbonate or citrate) and 800 units of Vitamin D per day seems prudent. Certain women may benefit from higher intake of Vitamin D.  If you are among these women, your doctor will have told you during your visit.    PAP SMEARS:  Pap smears, to check for cervical cancer or precancers,  have traditionally been done yearly, although recent scientific advances have shown that most women can have pap smears less often.  However, every woman still should have a physical exam from her gynecologist every year. It will include a breast check, inspection of the vulva and vagina to check for abnormal growths or skin changes, a visual exam of the cervix, and then an exam to evaluate the size and shape of the uterus and  ovaries.  And after 63 years of age, a rectal exam is indicated to check for rectal cancers. We will also provide age appropriate advice regarding health maintenance, like when you should have certain vaccines, screening for sexually transmitted diseases, bone density testing, colonoscopy, mammograms, etc.   MAMMOGRAMS:  All women over 40 years old should have a yearly mammogram. Many facilities now offer a "3D" mammogram, which may cost around $50 extra out of pocket. If possible,  we recommend you accept the option to have the 3D mammogram performed.  It both reduces the number of women who will be called back for extra views which then turn out to be normal, and it is better than the routine mammogram at detecting truly abnormal areas.    COLONOSCOPY:  Colonoscopy to screen for colon cancer is recommended for all women at age 50.  We know, you hate the idea of the prep.  We agree, BUT, having colon cancer and not knowing it is worse!!  Colon cancer so often starts as a polyp that can be seen and removed at colonscopy, which can quite literally save your life!  And if your first colonoscopy is normal and you have no family history of colon cancer, most women don't have to have it again for 10 years.  Once every ten years, you can do something that may end up saving your life, right?  We will be happy to help you get it scheduled when you are ready.    Be sure to check your insurance coverage so you understand how much it will cost.  It may be covered as a preventative service at no cost, but you should check your particular policy.    Atrophic Vaginitis Atrophic vaginitis is a problem of low levels of estrogen in women. This problem can happen at any age. It is most common in women who have gone through menopause ("the change").  HOW WILL I KNOW IF I HAVE THIS PROBLEM? You may have:  Trouble with peeing (urinating), such as:  Going to the bathroom often.  A hard time holding your pee until you reach a  bathroom.  Leaking pee.  Having pain when you pee.  Itching or a burning feeling.  Vaginal bleeding and spotting.  Pain during sex.  Dryness of the vagina.  A yellow, bad-smelling fluid (discharge) coming from the vagina. HOW WILL MY DOCTOR CHECK FOR THIS PROBLEM?  During your exam, your doctor will likely find the problem.  If there is a vaginal fluid, it may be checked for infection. HOW WILL THIS PROBLEM BE TREATED? Keep the vulvar skin as clean as possible. Moisturizers and lubricants can help with some of the symptoms. Estrogen replacement can help. There are 2 ways to take estrogen:  Systemic estrogen gets estrogen to your whole body. It takes many weeks or months before the symptoms get better.  You take an estrogen pill.  You use a skin patch. This is a patch that you put on your skin.  If you still have your uterus, your doctor may ask you to take a hormone. Talk to your doctor about the right medicine for you.  Estrogen cream.  This puts estrogen only at the part of your body where you apply it. The cream is put into the vagina or put on the vulvar skin. For some women, estrogen cream works faster than pills or the patch. CAN ALL WOMEN WITH THIS PROBLEM USE ESTROGEN? No. Women with certain types of cancer, liver problems, or problems with blood clots should not take estrogen. Your doctor can help you decide the best treatment for your symptoms. Document Released: 08/21/2007 Document Revised: 05/27/2011 Document Reviewed: 08/21/2007 Exit[employer] Patient Information 2014 [employer].  

## 2013-06-14 LAB — IPS PAP TEST WITH HPV

## 2013-08-16 LAB — HM MAMMOGRAPHY

## 2013-10-25 ENCOUNTER — Encounter: Payer: Self-pay | Admitting: Cardiovascular Disease

## 2013-11-02 ENCOUNTER — Other Ambulatory Visit: Payer: Self-pay | Admitting: *Deleted

## 2013-11-02 ENCOUNTER — Encounter: Payer: Self-pay | Admitting: Cardiovascular Disease

## 2013-11-02 ENCOUNTER — Other Ambulatory Visit: Payer: Self-pay | Admitting: Cardiovascular Disease

## 2013-11-02 NOTE — Telephone Encounter (Signed)
Rx was sent to pharmacy electronically. 

## 2013-11-04 ENCOUNTER — Other Ambulatory Visit: Payer: Self-pay | Admitting: *Deleted

## 2013-11-04 MED ORDER — METOPROLOL TARTRATE 50 MG PO TABS: ORAL_TABLET | ORAL | Status: DC

## 2013-11-04 NOTE — Telephone Encounter (Signed)
[place] sent for metoprolol per patient request via mychart

## 2013-11-26 ENCOUNTER — Ambulatory Visit: Payer: Medicare Other | Admitting: Obstetrics and Gynecology

## 2013-11-29 ENCOUNTER — Encounter: Payer: Self-pay | Admitting: Obstetrics and Gynecology

## 2013-11-29 NOTE — Telephone Encounter (Signed)
Routing to provider for final review. Patient agreeable to disposition. Will close encounter.     

## 2014-01-17 ENCOUNTER — Encounter: Payer: Self-pay | Admitting: Obstetrics and Gynecology

## 2014-01-27 ENCOUNTER — Encounter: Payer: Self-pay | Admitting: Cardiovascular Disease

## 2014-01-27 NOTE — Telephone Encounter (Signed)
Flu vaccine documented.

## 2014-02-03 ENCOUNTER — Encounter: Payer: Self-pay | Admitting: Cardiovascular Disease

## 2014-02-03 ENCOUNTER — Ambulatory Visit (INDEPENDENT_AMBULATORY_CARE_PROVIDER_SITE_OTHER): Payer: Medicare Other | Admitting: Cardiovascular Disease

## 2014-02-03 VITALS — BP 128/80 | HR 58 | Resp 16 | Ht 66.0 in | Wt 231.6 lb

## 2014-02-03 DIAGNOSIS — I951 Orthostatic hypotension: Secondary | ICD-10-CM

## 2014-02-03 DIAGNOSIS — E669 Obesity, unspecified: Secondary | ICD-10-CM

## 2014-02-03 DIAGNOSIS — I480 Paroxysmal atrial fibrillation: Secondary | ICD-10-CM

## 2014-02-03 MED ORDER — METOPROLOL TARTRATE 25 MG PO TABS: ORAL_TABLET | ORAL | Status: DC

## 2014-02-03 MED ORDER — PROPAFENONE HCL 150 MG PO TABS: 150.0000 mg | ORAL_TABLET | Freq: Three times a day (TID) | ORAL | Status: DC

## 2014-02-03 NOTE — Progress Notes (Signed)
Patient ID: Donna Moon, female   DOB: 01/20/1951, 63 y.o.   MRN: [id number]      Reason for office visit Paroxysmal atrial fibrillation  Donna Moon has a history of recurrent paroxysmal atrial fibrillation for which she underwent radiofrequency ablation in 2006 and 2011. Later on she again developed troublesome palpitations that responded very well to per pass the known, although we have been unable to clearly document the type of recurrent arrhythmia. Recently she has noticed that she will have applications in the middle of the day. She has been taking her per Propafenone only twice daily, rather than three times daily. She has no history of structural cardiac abnormalities. She has long-standing problems with obesity and occasional mild orthostatic hypotension. He does not have a history of stroke or TIA and has a low embolic risk score (only gets points for female gender).   Allergies  Allergen Reactions  . Lexapro [Escitalopram Oxalate]     Flu like symptoms   . Naprosyn [Naproxen] Rash    **Ibuprofen is okay to take-per patient**    Current Outpatient Prescriptions  Medication Sig Dispense Refill  . acetaminophen (TYLENOL) 325 MG tablet Take 2 tablets (650 mg total) by mouth every 6 (six) hours as needed. 60 tablet 0  . albuterol (PROVENTIL HFA;VENTOLIN HFA) 108 (90 BASE) MCG/ACT inhaler Inhale 2 puffs into the lungs every 6 (six) hours as needed for wheezing.    Marland Kitchen aspirin 325 MG tablet Take 325 mg by mouth daily.    . Calcium Carb-Cholecalciferol (CALCIUM 600 + D PO) Take 1 tablet by mouth daily.    . fexofenadine (ALLEGRA) 180 MG tablet Take 180 mg by mouth daily.    . fluticasone (FLONASE) 50 MCG/ACT nasal spray Place 2 sprays into the nose daily.    Marland Kitchen ibuprofen (ADVIL,MOTRIN) 800 MG tablet Take 800 mg by mouth every 8 (eight) hours as needed.    . lansoprazole (PREVACID) 30 MG capsule Take 30 mg by mouth daily.    . metoprolol tartrate (LOPRESSOR) 25 MG tablet Take 25mg  in the  AM and 50mg  in the PM. 90 tablet 11  . propafenone (RYTHMOL) 150 MG tablet Take 1 tablet (150 mg total) by mouth every 8 (eight) hours. 90 tablet 11   No current facility-administered medications for this visit.    Past Medical History  Diagnosis Date  . Atrial fibrillation   . Dizziness   . Obesity   . PONV (postoperative nausea and vomiting)     however no problems with past surgeries here.  . Asthma   . Hx of seasonal allergies     tx. Allegra, Flonase  . Reflux   . Blood transfusion without reported diagnosis     as a child--age 6  . Dyspareunia   . Osteoporosis     Past Surgical History  Procedure Laterality Date  . Carpel tunnel  1993  . Appendectomy  1968  . Oophorectomy  2009  . Cardiac electrophysiology study and ablation  1997 & 2011  . Cesarean section  1986  . Total hip arthroplasty  12/27/2010    x2 replacements, x1 repair.  . Rotator cuff repair  1999    x2 -'97, 99  . Joint replacement Left     x2  . Total knee arthroplasty Left 12/07/2012    Procedure: LEFT TOTAL KNEE ARTHROPLASTY;  Surgeon: Gearlean Alf, MD;  Location: [facility];  Service: Orthopedics;  Laterality: Left;  . Pelvic laparoscopy  laparoscopic BSO--endometrioma    Family History  Problem Relation Age of Onset  . Stroke Mother   . Cancer Mother 66    dec uterine or ovarian ca  . Ovarian cancer Mother   . Heart attack Father   . Heart attack Brother   . Diabetes Sister     AODM  . Diabetes Sister     AODM  . Diabetes Maternal Grandmother   . Diabetes Paternal Grandmother     History   Social History  . Marital Status: Married    Spouse Name: N/A    Number of Children: N/A  . Years of Education: N/A   Occupational History  . Not on file.   Social History Main Topics  . Smoking status: Former Research scientist (life sciences)  . Smokeless tobacco: Former Systems developer    Quit date: 03/17/1988  . Alcohol Use: No  . Drug Use: No  . Sexual Activity:    Partners: Male    Birth Control/ Protection:  Post-menopausal   Other Topics Concern  . Not on file   Social History Narrative    Review of systems: The patient specifically denies any chest pain at rest or with exertion, dyspnea at rest or with exertion, orthopnea, paroxysmal nocturnal dyspnea, syncope, focal neurological deficits, intermittent claudication, lower extremity edema, unexplained weight gain, cough, hemoptysis or wheezing.  The patient also denies abdominal pain, nausea, vomiting, dysphagia, diarrhea, constipation, polyuria, polydipsia, dysuria, hematuria, frequency, urgency, abnormal bleeding or bruising, fever, chills, unexpected weight changes, mood swings, change in skin or hair texture, change in voice quality, auditory or visual problems, allergic reactions or rashes, new musculoskeletal complaints other than usual "aches and pains".   PHYSICAL EXAM BP 128/80 mmHg  Pulse 58  Resp 16  Ht 5\' 6"  (1.676 m)  Wt 231 lb 9.6 oz (105.053 kg)  BMI 37.40 kg/m2  LMP 03/18/2001 General: Alert, oriented x3, no distress Head: no evidence of trauma, PERRL, EOMI, no exophtalmos or lid lag, no myxedema, no xanthelasma; normal ears, nose and oropharynx Neck: normal jugular venous pulsations and no hepatojugular reflux; brisk carotid pulses without delay and no carotid bruits Chest: clear to auscultation, no signs of consolidation by percussion or palpation, normal fremitus, symmetrical and full respiratory excursions Cardiovascular: normal position and quality of the apical impulse, regular rhythm, normal first and second heart sounds, no murmurs, rubs or gallops Abdomen: no tenderness or distention, no masses by palpation, no abnormal pulsatility or arterial bruits, normal bowel sounds, no hepatosplenomegaly Extremities: no clubbing, cyanosis or edema; 2+ radial, ulnar and brachial pulses bilaterally; 2+ right femoral, posterior tibial and dorsalis pedis pulses; 2+ left femoral, posterior tibial and dorsalis pedis pulses; no  subclavian or femoral bruits Neurological: grossly nonfocal   EKG: Mild sinus bradycardia, early RS transition in lead V2 otherwise normal, QTC 420 ms   BMET    Component Value Date/Time   NA 136 12/09/2012 0445   K 3.5 12/09/2012 0445   CL 104 12/09/2012 0445   CO2 25 12/09/2012 0445   GLUCOSE 104* 12/09/2012 0445   BUN 11 12/09/2012 0445   CREATININE 0.87 12/09/2012 0445   CALCIUM 8.9 12/09/2012 0445   GFRNONAA 70* 12/09/2012 0445   GFRAA 81* 12/09/2012 0445     ASSESSMENT AND PLAN  Palpitations/paroxysmal atrial fibrillation status post RF ablation I encouraged Mrs. Bixby to have another event monitor, but she declined secondary to the cost. She has had a very good empirical response to beta blocker plus Rythmol therapy. Firm identification of  the arrhythmia would not make much difference in the recommendation for embolism prophylaxis (aspirin alone should suffice).  Orthostatic hypotension Since reducing the dose of beta blocker this has no longer been a problem  Obesity Again, she is strongly encouraged to try to lose weight. She does not appear to have the typical symptoms of obstructive sleep apnea.  Orders Placed This Encounter  Procedures  . EKG 12-Lead   Meds ordered this encounter  Medications  . metoprolol tartrate (LOPRESSOR) 25 MG tablet    Sig: Take 25mg  in the AM and 50mg  in the PM.    Dispense:  90 tablet    Refill:  11  . propafenone (RYTHMOL) 150 MG tablet    Sig: Take 1 tablet (150 mg total) by mouth every 8 (eight) hours.    Dispense:  90 tablet    Refill:  [address] [place], [facility] [phone] office [phone] pager

## 2014-02-03 NOTE — Patient Instructions (Signed)
Dr Croitoru recommends that you schedule a follow-up appointment in 6 months. You will receive a reminder letter in the mail two months in advance. If you don't receive a letter, please call our office to schedule the follow-up appointment. 

## 2014-06-15 ENCOUNTER — Telehealth: Payer: Self-pay | Admitting: Cardiovascular Disease

## 2014-06-17 ENCOUNTER — Encounter: Payer: Self-pay | Admitting: Obstetrics and Gynecology

## 2014-06-17 ENCOUNTER — Ambulatory Visit: Payer: Medicare Other | Admitting: Obstetrics and Gynecology

## 2014-06-17 ENCOUNTER — Ambulatory Visit (INDEPENDENT_AMBULATORY_CARE_PROVIDER_SITE_OTHER): Payer: Medicare Other | Admitting: Obstetrics and Gynecology

## 2014-06-17 VITALS — BP 126/70 | HR 68 | Resp 20 | Ht 67.0 in | Wt 237.0 lb

## 2014-06-17 DIAGNOSIS — Z1211 Encounter for screening for malignant neoplasm of colon: Secondary | ICD-10-CM

## 2014-06-17 DIAGNOSIS — Z13820 Encounter for screening for osteoporosis: Secondary | ICD-10-CM | POA: Diagnosis not present

## 2014-06-17 DIAGNOSIS — R319 Hematuria, unspecified: Secondary | ICD-10-CM | POA: Diagnosis not present

## 2014-06-17 DIAGNOSIS — Z01419 Encounter for gynecological examination (general) (routine) without abnormal findings: Secondary | ICD-10-CM

## 2014-06-17 DIAGNOSIS — Z0000 Encounter for general adult medical examination without abnormal findings: Secondary | ICD-10-CM | POA: Diagnosis not present

## 2014-06-17 LAB — HEMOGLOBIN, FINGERSTICK: HEMOGLOBIN, FINGERSTICK: 13.2 g/dL (ref 12.0–16.0)

## 2014-06-17 LAB — POCT URINALYSIS DIPSTICK
Bilirubin, UA: NEGATIVE
GLUCOSE UA: NEGATIVE
Ketones, UA: NEGATIVE
LEUKOCYTES UA: NEGATIVE
NITRITE UA: NEGATIVE
PH UA: 5
Protein, UA: NEGATIVE
UROBILINOGEN UA: NEGATIVE

## 2014-06-17 NOTE — Patient Instructions (Signed)

## 2014-06-17 NOTE — Progress Notes (Signed)
64 y.o. G1P1001 MarriedCaucasianF here for annual exam.    Labs drawn here today.  Wants full blood work.   States a history of hematuria.  Evaluation at [facility] about 10 years ago.  Normal cystoscopy.   New onset headaches. Thinks it is allergies.  Ibuprofen and Flonase relieves this.   Having mobility issues.  Has had left knee replacement and left hip replacement 3 times.  Waiting to do a right shoulder replacement.   Sister just diagnosed with kidney cancer.  Goes to [facility].   Enjoying retriement! Patient's last menstrual period was 03/18/2001.          Sexually active: Yes.    The current method of family planning is post menopausal status.    Exercising: Yes.    Aerobics stregth  Smoker:  no  Health Maintenance: Pap: 06/10/13 Neg. HR HPV:neg History of abnormal Pap:  no MMG: 08/2013 Normal - per pt.  [facility] in [place].  Self Breast Check: Yes, every month. Colonoscopy: 2009 BMD:   Never TDaP:  2011 Screening Labs: Here today, Hb today: 13.2, Urine today: RBC=Large   reports that she has quit smoking. She quit smokeless tobacco use about 26 years ago. She reports that she does not drink alcohol or use illicit drugs.  Past Medical History  Diagnosis Date  . Atrial fibrillation   . Dizziness   . Obesity   . PONV (postoperative nausea and vomiting)     however no problems with past surgeries here.  . Asthma   . Hx of seasonal allergies     tx. Allegra, Flonase  . Reflux   . Blood transfusion without reported diagnosis     as a child--age 84  . Dyspareunia   . Osteoporosis     Past Surgical History  Procedure Laterality Date  . Carpel tunnel  1993  . Appendectomy  1968  . Oophorectomy  2009  . Cardiac electrophysiology study and ablation  1997 & 2011  . Cesarean section  1986  . Total hip arthroplasty  12/27/2010    x2 replacements, x1 repair.  . Rotator cuff repair  1999    x2 -'97, 99  . Joint replacement Left     x2  . Total knee  arthroplasty Left 12/07/2012    Procedure: LEFT TOTAL KNEE ARTHROPLASTY;  Surgeon: Gearlean Alf, MD;  Location: [facility];  Service: Orthopedics;  Laterality: Left;  . Pelvic laparoscopy      laparoscopic BSO--endometrioma    Current Outpatient Prescriptions  Medication Sig Dispense Refill  . albuterol (PROVENTIL HFA;VENTOLIN HFA) 108 (90 BASE) MCG/ACT inhaler Inhale 2 puffs into the lungs every 6 (six) hours as needed for wheezing.    Marland Kitchen aspirin 325 MG tablet Take 325 mg by mouth daily.    . Calcium Carb-Cholecalciferol (CALCIUM 600 + D PO) Take 1 tablet by mouth daily.    . fexofenadine (ALLEGRA) 180 MG tablet Take 180 mg by mouth daily.    . fluticasone (FLONASE) 50 MCG/ACT nasal spray Place 2 sprays into the nose daily.    Marland Kitchen ibuprofen (ADVIL,MOTRIN) 800 MG tablet Take 800 mg by mouth every 8 (eight) hours as needed.    . lansoprazole (PREVACID) 30 MG capsule Take 30 mg by mouth daily.    . metoprolol tartrate (LOPRESSOR) 25 MG tablet Take 25mg  in the AM and 50mg  in the PM. 90 tablet 11  . Probiotic Product (PROBIOTIC DAILY PO) Take by mouth daily.    . propafenone (RYTHMOL) 150  MG tablet Take 1 tablet (150 mg total) by mouth every 8 (eight) hours. 90 tablet 11   No current facility-administered medications for this visit.    Family History  Problem Relation Age of Onset  . Stroke Mother   . Cancer Mother 69    dec uterine or ovarian ca  . Ovarian cancer Mother   . Heart attack Father   . Heart attack Brother   . Diabetes Sister     AODM  . Diabetes Sister     AODM  . Diabetes Maternal Grandmother   . Diabetes Paternal Grandmother     ROS:  Pertinent items are noted in HPI.  Otherwise, a comprehensive ROS was negative.  Exam:   BP 126/70 mmHg  Pulse 68  Resp 20  Ht 5\' 7"  (1.702 m)  Wt 237 lb (107.502 kg)  BMI 37.11 kg/m2  LMP 03/18/2001     Height: 5\' 7"  (170.2 cm)  Ht Readings from Last 3 Encounters:  06/17/14 5\' 7"  (1.702 m)  02/03/14 5\' 6"  (1.676 m)  06/10/13 5'  6" (1.676 m)    General appearance: alert, cooperative and appears stated age Head: Normocephalic, without obvious abnormality, atraumatic Neck: no adenopathy, supple, symmetrical, trachea midline and thyroid normal to inspection and palpation Lungs: clear to auscultation bilaterally Breasts: normal appearance, no masses or tenderness, Inspection negative, No nipple retraction or dimpling, No nipple discharge or bleeding, No axillary or supraclavicular adenopathy Heart: regular rate and rhythm Abdomen: soft, non-tender; bowel sounds normal; no masses,  no organomegaly Extremities: extremities normal, atraumatic, no cyanosis or edema Skin: Skin color, texture, turgor normal. No rashes or lesions Lymph nodes: Cervical, supraclavicular, and axillary nodes normal. No abnormal inguinal nodes palpated Neurologic: Grossly normal   Pelvic: External genitalia:  no lesions              Urethra:  normal appearing urethra with no masses, tenderness or lesions              Bartholins and Skenes: normal                 Vagina: normal appearing vagina with normal color and discharge, no lesions              Cervix: no lesions              Pap taken: No. Bimanual Exam:  Uterus:  normal size, contour, position, consistency, mobility, non-tender              Adnexa: no mass, fullness, tenderness               Rectovaginal: Confirms               Anus:  normal sphincter tone, no lesions  Chaperone was present for exam.  A:  Well Woman with normal exam Status post lap BSO.  Microscopic hematuria.  FH of kidney cancer in sister.   P:   Mammogram yearly. Will get reports from 2015.  pap smear not indicated.  Routine labs.  Urine micro and culture.  If has any significant hematuria, will send to Dr. Dutch Gray.  [facility].  Patient will call.  Referral for colonoscopy at [facility].  return annually or prn

## 2014-06-17 NOTE — Telephone Encounter (Signed)
Closed encounter °

## 2014-06-18 LAB — CBC
HCT: 39.6 % (ref 36.0–46.0)
Hemoglobin: 13.4 g/dL (ref 12.0–15.0)
MCH: 31.3 pg (ref 26.0–34.0)
MCHC: 33.8 g/dL (ref 30.0–36.0)
MCV: 92.5 fL (ref 78.0–100.0)
MPV: 8.5 fL — ABNORMAL LOW (ref 8.6–12.4)
PLATELETS: 222 10*3/uL (ref 150–400)
RBC: 4.28 MIL/uL (ref 3.87–5.11)
RDW: 14.5 % (ref 11.5–15.5)
WBC: 6.4 10*3/uL (ref 4.0–10.5)

## 2014-06-18 LAB — URINALYSIS, MICROSCOPIC ONLY
BACTERIA UA: NONE SEEN
CASTS: NONE SEEN
Crystals: NONE SEEN
Squamous Epithelial / LPF: NONE SEEN

## 2014-06-18 LAB — COMPREHENSIVE METABOLIC PANEL
ALK PHOS: 57 U/L (ref 39–117)
ALT: 17 U/L (ref 0–35)
AST: 19 U/L (ref 0–37)
Albumin: 4.4 g/dL (ref 3.5–5.2)
BILIRUBIN TOTAL: 0.4 mg/dL (ref 0.2–1.2)
BUN: 20 mg/dL (ref 6–23)
CALCIUM: 9.1 mg/dL (ref 8.4–10.5)
CO2: 24 meq/L (ref 19–32)
CREATININE: 1.01 mg/dL (ref 0.50–1.10)
Chloride: 105 mEq/L (ref 96–112)
GLUCOSE: 87 mg/dL (ref 70–99)
Potassium: 4.6 mEq/L (ref 3.5–5.3)
SODIUM: 140 meq/L (ref 135–145)
TOTAL PROTEIN: 6.8 g/dL (ref 6.0–8.3)

## 2014-06-18 LAB — URINE CULTURE

## 2014-06-18 LAB — LIPID PANEL
CHOL/HDL RATIO: 3.5 ratio
Cholesterol: 212 mg/dL — ABNORMAL HIGH (ref 0–200)
HDL: 61 mg/dL (ref >=46)
LDL CALC: 106 mg/dL — AB (ref 0–99)
Triglycerides: 226 mg/dL — ABNORMAL HIGH (ref <150)
VLDL: 45 mg/dL — ABNORMAL HIGH (ref 0–40)

## 2014-06-18 LAB — TSH: TSH: 2.367 u[IU]/mL (ref 0.350–4.500)

## 2014-06-20 ENCOUNTER — Encounter: Payer: Self-pay | Admitting: Internal Medicine

## 2014-08-04 ENCOUNTER — Ambulatory Visit (INDEPENDENT_AMBULATORY_CARE_PROVIDER_SITE_OTHER): Payer: Medicare Other | Admitting: Cardiovascular Disease

## 2014-08-04 ENCOUNTER — Encounter: Payer: Self-pay | Admitting: Cardiovascular Disease

## 2014-08-04 VITALS — BP 134/84 | HR 59 | Resp 16 | Ht 67.0 in | Wt 235.0 lb

## 2014-08-04 DIAGNOSIS — I480 Paroxysmal atrial fibrillation: Secondary | ICD-10-CM | POA: Diagnosis not present

## 2014-08-04 NOTE — Progress Notes (Signed)
Patient ID: Donna Moon, female   DOB: Jun 15, 1950, 64 y.o.   MRN: [id number]     Cardiology Office Note   Date:  08/04/2014   ID:  Donna Moon, DOB 1951-02-08, MRN [id number]  PCP:  Jacqulyn Bath, MD  Cardiologist:   Sanda Klein, MD   Chief Complaint  Patient presents with  . Follow-up    Patient has no complaints.      History of Present Illness: Donna Moon is a 64 y.o. female who presents for follow up of palpitations, presumed to be due to persistent atrial arrhythmia following two prior ablation procedures. Symptoms are very well controlled with propafenone and metoprolol - very rare and brief (less than weekly, no longer than 2 minutes) since she went to TID dosing of propafenone.  Denies syncope, fatigue, dizziness, dyspnea, chest pain, edema, etc. No neuro complaints or other embolic events. Low stroke risk (CHADSVasc zero - isolated female gender). Blames kee problems for obesity, inability to lose weight.   Past Medical History  Diagnosis Date  . Atrial fibrillation   . Dizziness   . Obesity   . PONV (postoperative nausea and vomiting)     however no problems with past surgeries here.  . Asthma   . Hx of seasonal allergies     tx. Allegra, Flonase  . Reflux   . Blood transfusion without reported diagnosis     as a child--age 65  . Dyspareunia   . Osteoporosis     Past Surgical History  Procedure Laterality Date  . Carpel tunnel  1993  . Appendectomy  1968  . Oophorectomy  2009  . Cardiac electrophysiology study and ablation  1997 & 2011  . Cesarean section  1986  . Total hip arthroplasty  12/27/2010    x2 replacements, x1 repair.  . Rotator cuff repair  1999    x2 -'97, 99  . Joint replacement Left     x2  . Total knee arthroplasty Left 12/07/2012    Procedure: LEFT TOTAL KNEE ARTHROPLASTY;  Surgeon: Gearlean Alf, MD;  Location: [facility];  Service: Orthopedics;  Laterality: Left;  . Pelvic laparoscopy      laparoscopic BSO--endometrioma      Current Outpatient Prescriptions  Medication Sig Dispense Refill  . albuterol (PROVENTIL HFA;VENTOLIN HFA) 108 (90 BASE) MCG/ACT inhaler Inhale 2 puffs into the lungs every 6 (six) hours as needed for wheezing.    Marland Kitchen aspirin 325 MG tablet Take 325 mg by mouth daily.    . Calcium Carb-Cholecalciferol (CALCIUM 600 + D PO) Take 1 tablet by mouth daily.    . fexofenadine (ALLEGRA) 180 MG tablet Take 180 mg by mouth daily.    . fluticasone (FLONASE) 50 MCG/ACT nasal spray Place 2 sprays into the nose daily.    Marland Kitchen ibuprofen (ADVIL,MOTRIN) 800 MG tablet Take 800 mg by mouth every 8 (eight) hours as needed.    . lansoprazole (PREVACID) 30 MG capsule Take 30 mg by mouth daily.    . metoprolol tartrate (LOPRESSOR) 25 MG tablet Take 25mg  in the AM and 50mg  in the PM. 90 tablet 11  . Probiotic Product (PROBIOTIC DAILY PO) Take by mouth daily.    . propafenone (RYTHMOL) 150 MG tablet Take 1 tablet (150 mg total) by mouth every 8 (eight) hours. 90 tablet 11   No current facility-administered medications for this visit.    Allergies:   Lexapro    Social History:  The patient  reports that she has  quit smoking. She quit smokeless tobacco use about 26 years ago. She reports that she does not drink alcohol or use illicit drugs.   Family History:  The patient's family history includes Cancer (age of onset: 7) in her mother; Diabetes in her maternal grandmother, paternal grandmother, sister, and sister; Heart attack in her brother and father; Ovarian cancer in her mother; Stroke in her mother.    ROS:  Please see the history of present illness.    Otherwise, review of systems positive for none.   All other systems are reviewed and negative.    PHYSICAL EXAM: VS:  BP 134/84 mmHg  Pulse 59  Resp 16  Ht 5\' 7"  (1.702 m)  Wt 235 lb (106.595 kg)  BMI 36.80 kg/m2  LMP 03/18/2001 , BMI Body mass index is 36.8 kg/(m^2).  General: Alert, oriented x3, no distress Head: no evidence of trauma, PERRL,  EOMI, no exophtalmos or lid lag, no myxedema, no xanthelasma; normal ears, nose and oropharynx Neck: normal jugular venous pulsations and no hepatojugular reflux; brisk carotid pulses without delay and no carotid bruits Chest: clear to auscultation, no signs of consolidation by percussion or palpation, normal fremitus, symmetrical and full respiratory excursions Cardiovascular: normal position and quality of the apical impulse, regular rhythm, normal first and second heart sounds, no murmurs, rubs or gallops Abdomen: no tenderness or distention, no masses by palpation, no abnormal pulsatility or arterial bruits, normal bowel sounds, no hepatosplenomegaly Extremities: no clubbing, cyanosis or edema; 2+ radial, ulnar and brachial pulses bilaterally; 2+ right femoral, posterior tibial and dorsalis pedis pulses; 2+ left femoral, posterior tibial and dorsalis pedis pulses; no subclavian or femoral bruits Neurological: grossly nonfocal Psych: euthymic mood, full affect   EKG:  EKG is ordered today. The ekg ordered today demonstrates sinus brady, ssharp Q in I and aVLQTc 431 ms   Recent Labs: 06/17/2014: ALT 17; BUN 20; Creatinine 1.01; Hemoglobin 13.4; Platelets 222; Potassium 4.6; Sodium 140; TSH 2.367    Lipid Panel    Component Value Date/Time   CHOL 212* 06/17/2014 1414   TRIG 226* 06/17/2014 1414   HDL 61 06/17/2014 1414   CHOLHDL 3.5 06/17/2014 1414   VLDL 45* 06/17/2014 1414   LDLCALC 106* 06/17/2014 1414      Wt Readings from Last 3 Encounters:  08/04/14 235 lb (106.595 kg)  06/17/14 237 lb (107.502 kg)  02/03/14 231 lb 9.6 oz (105.053 kg)     ASSESSMENT AND PLAN:  Palpitations/paroxysmal atrial fibrillation status post RF ablation She has had a very good empirical response to beta blocker plus Rythmol therapy. Firm identification of the arrhythmia would not make much difference in the recommendation for embolism prophylaxis (aspirin alone should suffice).  Orthostatic  hypotension Since reducing the dose of beta blocker this has no longer been a problem  Obesity Again, she is strongly encouraged to try to lose weight. She does not appear to have the typical symptoms of obstructive sleep apnea.    Current medicines are reviewed at length with the patient today.  The patient does not have concerns regarding medicines.  The following changes have been made:  no change  Labs/ tests ordered today include:  No orders of the defined types were placed in this encounter.   Patient Instructions  Dr. Sallyanne Kuster recommends that you schedule a follow-up appointment in: [place].      Mikael Spray, MD  08/04/2014 10:21 PM    Sanda Klein, MD, [facility] [phone] office [phone] pager

## 2014-08-04 NOTE — Patient Instructions (Signed)
Dr. Croitoru recommends that you schedule a follow-up appointment in: ONE YEAR   

## 2014-08-15 ENCOUNTER — Encounter: Payer: Self-pay | Admitting: Obstetrics and Gynecology

## 2014-08-16 ENCOUNTER — Encounter: Payer: Self-pay | Admitting: Internal Medicine

## 2014-08-16 ENCOUNTER — Telehealth: Payer: Self-pay | Admitting: Emergency Medicine

## 2014-08-16 ENCOUNTER — Ambulatory Visit (INDEPENDENT_AMBULATORY_CARE_PROVIDER_SITE_OTHER): Payer: Self-pay | Admitting: Internal Medicine

## 2014-08-16 VITALS — BP 128/78 | HR 64 | Ht 66.25 in | Wt 239.2 lb

## 2014-08-16 DIAGNOSIS — E2839 Other primary ovarian failure: Secondary | ICD-10-CM

## 2014-08-16 DIAGNOSIS — Z1211 Encounter for screening for malignant neoplasm of colon: Secondary | ICD-10-CM

## 2014-08-16 NOTE — Telephone Encounter (Signed)
Chief Complaint  Patient presents with  . Appointment    Bone Density Testing-Patient sent mychart message    Patient lives out of area and sent mychart message regarding scheduling bone density testing. Dr. Quincy Simmonds ordered Bone Density testing to [facility]. Need to see if patient would like to schedule imaging there or at another location. Patient does not have prior bone density imaging.   Called patient. Message left to return call to [place] at [phone].

## 2014-08-16 NOTE — Patient Instructions (Addendum)
  We will be in contact with you once Dr.Gessner receive your records and reviews them.    I appreciate the opportunity to care for you.  Silvano Rusk, M.D., [facility]

## 2014-08-16 NOTE — Progress Notes (Addendum)
Patient ID: MILIANI DEIKE, female   DOB: 03/18/1951, 64 y.o.   MRN: [id number]          The patient is here to discuss a screening colonoscopy. She received notification from her insurance company about having colon cancer screening. She has a history of a colonoscopy that was negative in 2010 and and [place] by Dr. Windy Fast. As far she know she was not due again until 2020.  He is not having any GI symptoms at this time. There is no significant family history with colorectal neoplasia such that we she would need an earlier interval.  1. Colon cancer screening     We will obtain her records and review them and determine when she should have another colonoscopy or other screening exam.  No charge for today's visit.  CC: WILLIS, Silvestre Moment, MD Josefa Half, MD  She had EGD 2009 - hiatal hernia "fairly large" and colonoscopy then also - NL Was having RLQ pain and nausea then   Next routine colonoscopy due 08/2017

## 2014-08-16 NOTE — Telephone Encounter (Signed)
Telephone encounter created and also sent patient a message via mychart regarding scheduling bone density testing.

## 2014-08-17 ENCOUNTER — Telehealth: Payer: Self-pay

## 2014-08-17 NOTE — Telephone Encounter (Signed)
Patient returned mychart message regarding Dexa and states she will call [facility] to schedule.  Arena from [facility] calling to request that order be changed and diagnosis changed to reflect post menopausal estrogen deficiency. Patient has medicare and no history of prior screening dexa.   Lonzo Candy states she will call patient back to schedule bone density.

## 2014-08-17 NOTE — Telephone Encounter (Signed)
Left message to call me on both her numbers. I need to inform her that Dr. Carlean Purl reviewed her Dr. Oliva Bustard. [place] records and said to put her in for a colon recall for June 2019.

## 2014-08-17 NOTE — Telephone Encounter (Signed)
Informed patient of the recall.  Recall put into epic.

## 2014-08-18 NOTE — Telephone Encounter (Signed)
Pt returned call and left her home number for recall [phone]

## 2014-08-18 NOTE — Telephone Encounter (Signed)
Message left to return call to [place] at [phone].    

## 2014-08-18 NOTE — Telephone Encounter (Signed)
Patient schedule for bone density at [facility] and per patient preference, she is emailed appointment information and instructions.  Routing to provider for final review. Patient agreeable to disposition. Will close encounter.

## 2014-10-03 ENCOUNTER — Ambulatory Visit
Admission: RE | Admit: 2014-10-03 | Discharge: 2014-10-03 | Disposition: A | Discharge status: Home / Self Care | Payer: Medicare Other | Source: Ambulatory Visit | Attending: Obstetrics and Gynecology | Admitting: Obstetrics and Gynecology

## 2014-10-03 DIAGNOSIS — E2839 Other primary ovarian failure: Secondary | ICD-10-CM

## 2015-01-18 ENCOUNTER — Telehealth: Payer: Self-pay | Admitting: *Deleted

## 2015-01-18 ENCOUNTER — Telehealth: Payer: Self-pay | Admitting: Obstetrics and Gynecology

## 2015-01-18 ENCOUNTER — Encounter: Payer: Self-pay | Admitting: Cardiovascular Disease

## 2015-01-18 NOTE — Telephone Encounter (Signed)
Spoke with Larena Glassman at [facility]. Advised of message as seen below from [place]. Larena Glassman is agreeable and will notify patient further assessment is needed for clearance with their practice.  Routing to provider for final review. Patient agreeable to disposition. Will close encounter.

## 2015-01-18 NOTE — Telephone Encounter (Signed)
Patient requesting surgical clearance from [facility].  They have only seen her for sick visits and need to see her for a well visit before they will clear her.  They are requesting from her specialists if she is cleared for hip surgery along with office notes.  Requesting surgical clearance:   1. Type of surgery: total hip   2. Surgeon: Dr. Maureen Ralphs  3. Surgical date: not set  4. Medications that need to be held: aspirin

## 2015-01-18 NOTE — Telephone Encounter (Signed)
Trish @ [facility] says this mutual patient is having hip surgery and needs surgical clearance. Larena Glassman said there office is unable to give surgical clearance due to patient only being seen for acute visits. Larena Glassman is asking if Dr.Silva would give surgical clearance based on her most recent exam with Dr.Silva.

## 2015-01-18 NOTE — Telephone Encounter (Signed)
Spoke with Larena Glassman at [facility]. Larena Glassman is calling to see if Dr.Silva would be able to provide medical clearance for this patient to have hip surgery. "She was seen in our office yesterday and was requesting clearance. We only see ehr for acute visits and are unable to provide her clearance without a complete physical. I wanted to check to see if your office could provide clearance based on her recent office visit with you." Advised I will need to speak with the provider who saw the patient for her aex. Advised our gynecological evaluations cover different things than a PCP evaluation does. Advised I will check on this and return call. Larena Glassman is agreeable. Requesting patients recent OV note, labs, and pap be sent to have on file for this patient. Will need to be faxed to [phone]. Most recent OV note, lab, and pap faxed to [phone] with cover sheet and confirmation.

## 2015-01-18 NOTE — Telephone Encounter (Signed)
Patient was last seen in our office on 06/17/2014 with Dr.Silva for aex. Routing to [place] for review and advise.

## 2015-01-18 NOTE — Telephone Encounter (Signed)
In epic. Walk it over.

## 2015-01-18 NOTE — Telephone Encounter (Signed)
Attempted to reach [place] at [facility]. On hold for 21 mintues. Will try again later.

## 2015-01-18 NOTE — Telephone Encounter (Signed)
I am unable to give surgical clearance for a patient to have hip surgery.  This is not an appropriate function for a GYN office.  I am sorry that we are unable to do this.

## 2015-01-19 NOTE — Telephone Encounter (Signed)
Letter and office note sent to [facility]. Letter handed to Dr. Peri Maris office staff.

## 2015-01-20 ENCOUNTER — Telehealth: Payer: Self-pay | Admitting: Cardiovascular Disease

## 2015-01-20 NOTE — Telephone Encounter (Signed)
error 

## 2015-01-23 ENCOUNTER — Ambulatory Visit: Payer: Medicare Other | Admitting: Cardiovascular Disease

## 2015-02-06 ENCOUNTER — Encounter: Payer: Self-pay | Admitting: Cardiovascular Disease

## 2015-02-06 ENCOUNTER — Other Ambulatory Visit: Payer: Self-pay | Admitting: Cardiovascular Disease

## 2015-02-06 NOTE — Telephone Encounter (Signed)
Refills sent electronically for metoprolol.

## 2015-02-14 ENCOUNTER — Other Ambulatory Visit: Payer: Self-pay | Admitting: Cardiovascular Disease

## 2015-02-15 NOTE — Telephone Encounter (Signed)
Rx(s) sent to pharmacy electronically.  

## 2015-02-16 ENCOUNTER — Ambulatory Visit: Payer: Self-pay | Admitting: Orthopedic Surgery

## 2015-02-16 NOTE — Progress Notes (Signed)
Preoperative surgical orders have been place into the Epic hospital system for Donna Moon on 02/16/2015, 11:57 AM  by Mickel Crow for surgery on 03-08-2015.  Preop Total Hip - Anterior Approach orders including IV Tylenol, and IV Decadron as long as there are no contraindications to the above medications. Arlee Muslim, PA-C

## 2015-02-20 ENCOUNTER — Encounter: Payer: Self-pay | Admitting: Cardiovascular Disease

## 2015-02-21 ENCOUNTER — Other Ambulatory Visit: Payer: Self-pay | Admitting: *Deleted

## 2015-02-21 ENCOUNTER — Ambulatory Visit: Payer: Self-pay | Admitting: Orthopedic Surgery

## 2015-02-21 MED ORDER — METOPROLOL TARTRATE 25 MG PO TABS: ORAL_TABLET | ORAL | Status: DC

## 2015-02-21 NOTE — H&P (Signed)
Donna Moon DOB: 07-06-1950 Married / Language: English / Race: White Female Date of Admission:  03/08/2015 CC:  Right Hip Pain History of Present Illness The patient is a 64 year old female who comes in  for a preoperative History and Physical. The patient is scheduled for a right total hip arthroplasty (anterior) to be performed by Dr. Dione Plover. Aluisio, MD at [facility] on 03-08-2015. The patient is a 64 year old female who presented for follow up of their hip. The patient is being followed for their right hip pain and osteoarthritis. Symptoms reported include: pain, pain when weightbearing, pt. can't bend over to the floor, and giving way. The patient feels that they are doing better but not good and reported their pain level to be mild to moderate. Current treatment includes: use of a cane. The following medication has been used for pain control: antiinflammatory medication (Ibuprofen). The patient presented following an MRI weeks ago. We went over MRI scan. She has got some collapse of femoral head, a horrible stress reaction throughout the femoral head and neck as well as degenerative change in the hip. She was tried to take care of this with conservative means and modifified acitvity. Despite these measures, she continues to have pain and dysfunction. They have been treated conservatively in the past for the above stated problem and despite conservative measures, they continue to have progressive pain and severe functional limitations and dysfunction. They have failed non-operative management including home exercise, medications. It is felt that they would benefit from undergoing total joint replacement. Risks and benefits of the procedure have been discussed with the patient and they elect to proceed with surgery. There are no active contraindications to surgery such as ongoing infection or rapidly progressive neurological disease.  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 mother Cancer  Father, Mother, Sister. mother and father mother, father and brother Rheumatoid Arthritis  sister Hypertension  sister Kidney disease  Mother. mother, sister and child Depression  Mother. Heart disease in female family member before age 52  Heart Disease  First Degree Relatives, Mother. mother and father Heart disease in female family member before age 61  Osteoarthritis  Mother. mother and sister First Degree Relatives  reported  Social History Exercise  Exercises weekly; does other and gym / weights Exercises weekly; does individual sport, other, gym / weights and team sport Exercises weekly; does gym / weights Living situation  live with spouse Children  1 Current work status  retired No history of drug/alcohol rehab  Not under pain contract  Marital status  married Never consumed alcohol  09/20/2013: Never consumed alcohol Tobacco use  Former smoker. 09/20/2013: smoke(d) 1 pack(s) per day never smoker former smoker Number of flights of stairs before winded  less than 1 2-3 Tobacco / smoke exposure  09/20/2013: no no Pain Contract  no Alcohol use  never consumed alcohol Drug/Alcohol Rehab (Currently)  no Drug/Alcohol Rehab (Previously)  no Post-Surgical Plans  Home following the Right Total Hip  Repalcement Previously in rehab  no Illicit drug use  no  Medication History Ibuprofen (800MG  Tablet, Oral) Active. Metoprolol Tartrate (25MG  Tablet, Oral) Active. Calcium Carbonate (600MG  Tablet, Oral) Active. Propafenone HCl (150MG  Tablet, Oral) Active. Prevacid (30MG  Capsule DR, Oral) Active. Aspirin (325MG  Tablet, Oral) Active.  Past Surgical History Cardiac Catherterization  Cardiac Ablation Procedures  2006, 2012 Dilation and Curettage of Uterus  Rotator Cuff Repair  right Carpal Tunnel Repair  bilateral; 1992 and 1993 Cesarean Delivery  Date: 1986. 1 time Arthroscopy of Knee  left Arthroscopy of Shoulder  left right Total Knee Replacement  Date: 2014. left Hip Fracture and Surgery  Date: 2012. left Total Hip Replacement  Date: 2010. left, again in 2013 Appendectomy  Date: 1968.  Review of Systems General Not Present- Chills, Fatigue, Fever, Memory Loss, Night Sweats, Weight Gain and Weight Loss. Skin Not Present- Eczema, Hives, Itching, Lesions and Rash. HEENT Not Present- Dentures, Double Vision, Headache, Hearing Loss, Tinnitus and Visual Loss. Respiratory Not Present- Allergies, Chronic Cough, Coughing up blood, Shortness of breath at rest and Shortness of breath with exertion. Cardiovascular Present- Palpitations. Not Present- Chest Pain, Difficulty Breathing Lying Down, Murmur, Racing/skipping heartbeats and Swelling. Gastrointestinal Not Present- Abdominal Pain, Bloody Stool, Constipation, Diarrhea, Difficulty Swallowing, Heartburn, Jaundice, Loss of appetitie, Nausea and Vomiting. Female Genitourinary Present- Blood in Urine (chronic, longterm which has been worked up). Not Present- Discharge, Flank Pain, Incontinence, Painful Urination, Urgency, Urinary frequency, Urinary Retention, Urinating at Night and Weak urinary stream. Musculoskeletal Present- Joint Pain. Not Present- Back Pain, Joint Swelling, Morning Stiffness, Muscle Pain, Muscle Weakness  and Spasms. Neurological Not Present- Blackout spells, Difficulty with balance, Dizziness, Paralysis, Tremor and Weakness. Psychiatric Not Present- Insomnia.  Vitals Weight: 231 lb Height: 68in Body Surface Area: 2.17 m Body Mass Index: 35.12 kg/m  BP: 132/47 (Sitting, Left Arm, Standard)   Physical Exam General Mental Status -Alert, cooperative and good historian. General Appearance-pleasant, Not in acute distress. Orientation-Oriented X3. Build & Nutrition-Well nourished and Well developed.  Head and Neck Head-normocephalic, atraumatic . Neck Global Assessment - supple, no bruit auscultated on the right, no bruit auscultated on the left.  Eye Pupil - Bilateral-Regular and Round. Motion - Bilateral-EOMI.  Chest and Lung Exam Auscultation Breath sounds - clear at anterior chest wall and clear at posterior chest wall. Adventitious sounds - No Adventitious sounds.  Cardiovascular Auscultation Rhythm - Regular rate and rhythm. Heart Sounds - S1 WNL and S2 WNL. Murmurs & Other Heart Sounds - Auscultation of the heart reveals - No Murmurs.  Abdomen Inspection Contour - Generalized mild distention. Palpation/Percussion Tenderness - Abdomen is non-tender to palpation. Rigidity (guarding) - Abdomen is soft. Auscultation Auscultation of the abdomen reveals - Bowel sounds normal.  Female Genitourinary Note: Not done, not pertinent to present illness   Musculoskeletal Note: On exam, well-developed female, in no distress. Right hip can be flexed to 100, rotate in 20, out 30, abduct 30 with pain.  We went over MRI scan. She has got some collapse of femoral head, a horrible stress reaction throughout the femoral head and neck as well as degenerative change in the hip.  Assessment & Plan Primary osteoarthritis of right hip (M16.11)  Note:Surgical Plans: Right Total Hip Replacement - Anterior Approach  Disposition: Home with family  Cards: Dr.  Sallyanne Kuster - Patient has been seen preoperatively and felt to be stable for surgery. "Maddeline E. Iskhakov is at low risk from a cardiac  standpoint for the upcoming procedure. Please avoid interruption of her cardiac medications, especially metoprolo."  IV TXA  Anesthesia Issues: None  Signed electronically by Joelene Millin, III PA-C

## 2015-02-27 NOTE — Patient Instructions (Addendum)
Donna Moon  02/27/2015   Your procedure is scheduled on:  03/08/2015    Report to [facility] Main  Entrance take [facility]  elevators to 3rd floor to  [place] at      1150 AM.  Call this number if you have problems the morning of surgery [phone]   Remember: ONLY 1 PERSON MAY GO WITH YOU TO SHORT STAY TO GET  READY MORNING OF [place].  Do not eat food after midnite.  May have clear liquids from 12 midnite until 0700am then nothing by mouth.      Take these medicines the morning of surgery with A SIP OF WATER:   Albuterol Inhaler if needed and bring, Flonase, Allegra, Prevacid, Lopressor ( Metoprolol), Rhythmol                                 You may not have any metal on your body including hair pins and              piercings  Do not wear jewelry, make-up, lotions, powders or perfumes, deodorant             Do not wear nail polish.  Do not shave  48 hours prior to surgery.             Do not bring valuables to the hospital. [place].  Contacts, dentures or bridgework may not be worn into surgery.  Leave suitcase in the car. After surgery it may be brought to your room.       Special Instructions:  Coughing and deep breathing exercises, leg exercises               Please read over the following fact sheets you were given: _____________________________________________________________________             [facility] - Preparing for Surgery Before surgery, you can play an important role.  Because skin is not sterile, your skin needs to be as free of germs as possible.  You can reduce the number of germs on your skin by washing with CHG (chlorahexidine gluconate) soap before surgery.  CHG is an antiseptic cleaner which kills germs and bonds with the skin to continue killing germs even after washing. Please DO NOT use if you have an allergy to CHG or antibacterial soaps.  If your skin becomes  reddened/irritated stop using the CHG and inform your nurse when you arrive at Short Stay. Do not shave (including legs and underarms) for at least 48 hours prior to the first CHG shower.  You may shave your face/neck. Please follow these instructions carefully:  1.  Shower with CHG Soap the night before surgery and the  morning of Surgery.  2.  If you choose to wash your hair, wash your hair first as usual with your  normal  shampoo.  3.  After you shampoo, rinse your hair and body thoroughly to remove the  shampoo.                           4.  Use CHG as you would any other liquid soap.  You can apply chg directly  to the  skin and wash                       Gently with a scrungie or clean washcloth.  5.  Apply the CHG Soap to your body ONLY FROM THE NECK DOWN.   Do not use on face/ open                           Wound or open sores. Avoid contact with eyes, ears mouth and genitals (private parts).                       Wash face,  Genitals (private parts) with your normal soap.             6.  Wash thoroughly, paying special attention to the area where your surgery  will be performed.  7.  Thoroughly rinse your body with warm water from the neck down.  8.  DO NOT shower/wash with your normal soap after using and rinsing off  the CHG Soap.                9.  Pat yourself dry with a clean towel.            10.  Wear clean pajamas.            11.  Place clean sheets on your bed the night of your first shower and do not  sleep with pets. Day of Surgery : Do not apply any lotions/deodorants the morning of surgery.  Please wear clean clothes to the hospital/surgery center.  FAILURE TO FOLLOW THESE INSTRUCTIONS MAY RESULT IN THE CANCELLATION OF YOUR SURGERY PATIENT SIGNATURE_________________________________  NURSE SIGNATURE__________________________________  ________________________________________________________________________  WHAT IS A BLOOD TRANSFUSION? Blood Transfusion Information  A  transfusion is the replacement of blood or some of its parts. Blood is made up of multiple cells which provide different functions.  Red blood cells carry oxygen and are used for blood loss replacement.  White blood cells fight against infection.  Platelets control bleeding.  Plasma helps clot blood.  Other blood products are available for specialized needs, such as hemophilia or other clotting disorders. BEFORE THE TRANSFUSION  Who gives blood for transfusions?   Healthy volunteers who are fully evaluated to make sure their blood is safe. This is blood bank blood. Transfusion therapy is the safest it has ever been in the practice of medicine. Before blood is taken from a donor, a complete history is taken to make sure that person has no history of diseases nor engages in risky social behavior (examples are intravenous drug use or sexual activity with multiple partners). The donor's travel history is screened to minimize risk of transmitting infections, such as malaria. The donated blood is tested for signs of infectious diseases, such as HIV and hepatitis. The blood is then tested to be sure it is compatible with you in order to minimize the chance of a transfusion reaction. If you or a relative donates blood, this is often done in anticipation of surgery and is not appropriate for emergency situations. It takes many days to process the donated blood. RISKS AND COMPLICATIONS Although transfusion therapy is very safe and saves many lives, the main dangers of transfusion include:  1. Getting an infectious disease. 2. Developing a transfusion reaction. This is an allergic reaction to something in the blood you were given. Every precaution is taken to prevent this. The  decision to have a blood transfusion has been considered carefully by your caregiver before blood is given. Blood is not given unless the benefits outweigh the risks. AFTER THE TRANSFUSION  Right after receiving a blood  transfusion, you will usually feel much better and more energetic. This is especially true if your red blood cells have gotten low (anemic). The transfusion raises the level of the red blood cells which carry oxygen, and this usually causes an energy increase.  The nurse administering the transfusion will monitor you carefully for complications. HOME CARE INSTRUCTIONS  No special instructions are needed after a transfusion. You may find your energy is better. Speak with your caregiver about any limitations on activity for underlying diseases you may have. SEEK MEDICAL CARE IF:   Your condition is not improving after your transfusion.  You develop redness or irritation at the intravenous (IV) site. SEEK IMMEDIATE MEDICAL CARE IF:  Any of the following symptoms occur over the next 12 hours:  Shaking chills.  You have a temperature by mouth above 102 F (38.9 C), not controlled by medicine.  Chest, back, or muscle pain.  People around you feel you are not acting correctly or are confused.  Shortness of breath or difficulty breathing.  Dizziness and fainting.  You get a rash or develop hives.  You have a decrease in urine output.  Your urine turns a dark color or changes to pink, red, or brown. Any of the following symptoms occur over the next 10 days:  You have a temperature by mouth above 102 F (38.9 C), not controlled by medicine.  Shortness of breath.  Weakness after normal activity.  The white part of the eye turns yellow (jaundice).  You have a decrease in the amount of urine or are urinating less often.  Your urine turns a dark color or changes to pink, red, or brown. Document Released: 03/01/2000 Document Revised: 05/27/2011 Document Reviewed: 10/19/2007 ExitCare Patient Information 2014 [place].  _______________________________________________________________________  Incentive Spirometer  An incentive spirometer is a tool that can help keep your  lungs clear and active. This tool measures how well you are filling your lungs with each breath. Taking long deep breaths may help reverse or decrease the chance of developing breathing (pulmonary) problems (especially infection) following:  A long period of time when you are unable to move or be active. BEFORE THE PROCEDURE   If the spirometer includes an indicator to show your best effort, your nurse or respiratory therapist will set it to a desired goal.  If possible, sit up straight or lean slightly forward. Try not to slouch.  Hold the incentive spirometer in an upright position. INSTRUCTIONS FOR USE  3. Sit on the edge of your bed if possible, or sit up as far as you can in bed or on a chair. 4. Hold the incentive spirometer in an upright position. 5. Breathe out normally. 6. Place the mouthpiece in your mouth and seal your lips tightly around it. 7. Breathe in slowly and as deeply as possible, raising the piston or the ball toward the top of the column. 8. Hold your breath for 3-5 seconds or for as long as possible. Allow the piston or ball to fall to the bottom of the column. 9. Remove the mouthpiece from your mouth and breathe out normally. 10. Rest for a few seconds and repeat Steps 1 through 7 at least 10 times every 1-2 hours when you are awake. Take your time and take a few  normal breaths between deep breaths. 11. The spirometer may include an indicator to show your best effort. Use the indicator as a goal to work toward during each repetition. 12. After each set of 10 deep breaths, practice coughing to be sure your lungs are clear. If you have an incision (the cut made at the time of surgery), support your incision when coughing by placing a pillow or rolled up towels firmly against it. Once you are able to get out of bed, walk around indoors and cough well. You may stop using the incentive spirometer when instructed by your caregiver.  RISKS AND COMPLICATIONS  Take your time so  you do not get dizzy or light-headed.  If you are in pain, you may need to take or ask for pain medication before doing incentive spirometry. It is harder to take a deep breath if you are having pain. AFTER USE  Rest and breathe slowly and easily.  It can be helpful to keep track of a log of your progress. Your caregiver can provide you with a simple table to help with this. If you are using the spirometer at home, follow these instructions: [place] IF:   You are having difficultly using the spirometer.  You have trouble using the spirometer as often as instructed.  Your pain medication is not giving enough relief while using the spirometer.  You develop fever of 100.5 F (38.1 C) or higher. SEEK IMMEDIATE MEDICAL CARE IF:   You cough up bloody sputum that had not been present before.  You develop fever of 102 F (38.9 C) or greater.  You develop worsening pain at or near the incision site. MAKE SURE YOU:   Understand these instructions.  Will watch your condition.  Will get help right away if you are not doing well or get worse. Document Released: 07/15/2006 Document Revised: 05/27/2011 Document Reviewed: 09/15/2006 [facility] Patient Information 2014 [place], [place].   ________________________________________________________________________

## 2015-03-01 ENCOUNTER — Encounter (HOSPITAL_COMMUNITY)
Admission: RE | Admit: 2015-03-01 | Discharge: 2015-03-01 | Disposition: A | Discharge status: Home / Self Care | Payer: Medicare Other | Source: Ambulatory Visit | Attending: Orthopedic Surgery | Admitting: Orthopedic Surgery

## 2015-03-01 ENCOUNTER — Encounter (HOSPITAL_COMMUNITY): Payer: Self-pay

## 2015-03-01 DIAGNOSIS — Z01812 Encounter for preprocedural laboratory examination: Secondary | ICD-10-CM | POA: Diagnosis present

## 2015-03-01 HISTORY — DX: Hematuria, unspecified: R31.9

## 2015-03-01 HISTORY — DX: Malignant (primary) neoplasm, unspecified: C80.1

## 2015-03-01 LAB — SURGICAL PCR SCREEN
MRSA, PCR: NEGATIVE
STAPHYLOCOCCUS AUREUS: NEGATIVE

## 2015-03-01 LAB — COMPREHENSIVE METABOLIC PANEL
ALK PHOS: 60 U/L (ref 38–126)
ALT: 17 U/L (ref 14–54)
AST: 19 U/L (ref 15–41)
Albumin: 4.4 g/dL (ref 3.5–5.0)
Anion gap: 5 (ref 5–15)
BILIRUBIN TOTAL: 0.6 mg/dL (ref 0.3–1.2)
BUN: 22 mg/dL — AB (ref 6–20)
CALCIUM: 9.3 mg/dL (ref 8.9–10.3)
CHLORIDE: 105 mmol/L (ref 101–111)
CO2: 26 mmol/L (ref 22–32)
CREATININE: 1.15 mg/dL — AB (ref 0.44–1.00)
GFR calc Af Amer: 57 mL/min — ABNORMAL LOW (ref >60)
GFR, EST NON AFRICAN AMERICAN: 49 mL/min — AB (ref >60)
Glucose, Bld: 101 mg/dL — ABNORMAL HIGH (ref 65–99)
Potassium: 4.5 mmol/L (ref 3.5–5.1)
Sodium: 136 mmol/L (ref 135–145)
TOTAL PROTEIN: 7 g/dL (ref 6.5–8.1)

## 2015-03-01 LAB — CBC
HEMATOCRIT: 41.7 % (ref 36.0–46.0)
Hemoglobin: 13.7 g/dL (ref 12.0–15.0)
MCH: 31 pg (ref 26.0–34.0)
MCHC: 32.9 g/dL (ref 30.0–36.0)
MCV: 94.3 fL (ref 78.0–100.0)
Platelets: 225 10*3/uL (ref 150–400)
RBC: 4.42 MIL/uL (ref 3.87–5.11)
RDW: 13 % (ref 11.5–15.5)
WBC: 5.7 10*3/uL (ref 4.0–10.5)

## 2015-03-01 LAB — URINE MICROSCOPIC-ADD ON

## 2015-03-01 LAB — URINALYSIS, ROUTINE W REFLEX MICROSCOPIC
Bilirubin Urine: NEGATIVE
Glucose, UA: NEGATIVE mg/dL
KETONES UR: NEGATIVE mg/dL
LEUKOCYTES UA: NEGATIVE
NITRITE: NEGATIVE
PH: 5.5 (ref 5.0–8.0)
Protein, ur: NEGATIVE mg/dL
Specific Gravity, Urine: 1.013 (ref 1.005–1.030)

## 2015-03-01 LAB — PROTIME-INR
INR: 0.95 (ref 0.00–1.49)
PROTHROMBIN TIME: 12.9 s (ref 11.6–15.2)

## 2015-03-01 LAB — APTT: aPTT: 29 seconds (ref 24–37)

## 2015-03-01 NOTE — Progress Notes (Signed)
01/18/15- Clearance- Dr  Loleta Chance on chart.   EKG- 08/04/2014- EPIC  LOV with Dr Loleta Chance- 08/04/14-EPIC

## 2015-03-01 NOTE — Progress Notes (Signed)
U/A and micro , CMP results done 03/01/2015 faxed via EPIC to Dr Wynelle Link.

## 2015-03-07 ENCOUNTER — Encounter: Payer: Self-pay | Admitting: Cardiovascular Disease

## 2015-03-08 ENCOUNTER — Inpatient Hospital Stay (HOSPITAL_COMMUNITY)
Admission: RE | Admit: 2015-03-08 | Discharge: 2015-03-10 | DRG: 470 | Disposition: A | Discharge status: Home Health | Payer: Medicare Other | Source: Ambulatory Visit | Attending: Orthopedic Surgery | Admitting: Orthopedic Surgery

## 2015-03-08 ENCOUNTER — Encounter (HOSPITAL_COMMUNITY)
Admission: RE | Disposition: A | Discharge status: Home Health | Payer: Self-pay | Source: Ambulatory Visit | Attending: Orthopedic Surgery

## 2015-03-08 ENCOUNTER — Inpatient Hospital Stay (HOSPITAL_COMMUNITY): Payer: Medicare Other | Admitting: Certified Registered Nurse Anesthetist

## 2015-03-08 ENCOUNTER — Inpatient Hospital Stay (HOSPITAL_COMMUNITY): Payer: Medicare Other

## 2015-03-08 ENCOUNTER — Encounter (HOSPITAL_COMMUNITY): Payer: Self-pay | Admitting: *Deleted

## 2015-03-08 DIAGNOSIS — Z96642 Presence of left artificial hip joint: Secondary | ICD-10-CM | POA: Diagnosis present

## 2015-03-08 DIAGNOSIS — Z7982 Long term (current) use of aspirin: Secondary | ICD-10-CM

## 2015-03-08 DIAGNOSIS — K219 Gastro-esophageal reflux disease without esophagitis: Secondary | ICD-10-CM | POA: Diagnosis present

## 2015-03-08 DIAGNOSIS — M1611 Unilateral primary osteoarthritis, right hip: Principal | ICD-10-CM | POA: Diagnosis present

## 2015-03-08 DIAGNOSIS — Z87891 Personal history of nicotine dependence: Secondary | ICD-10-CM

## 2015-03-08 DIAGNOSIS — Z01812 Encounter for preprocedural laboratory examination: Secondary | ICD-10-CM

## 2015-03-08 DIAGNOSIS — M797 Fibromyalgia: Secondary | ICD-10-CM | POA: Diagnosis present

## 2015-03-08 DIAGNOSIS — Z22322 Carrier or suspected carrier of Methicillin resistant Staphylococcus aureus: Secondary | ICD-10-CM | POA: Diagnosis not present

## 2015-03-08 DIAGNOSIS — M810 Age-related osteoporosis without current pathological fracture: Secondary | ICD-10-CM | POA: Diagnosis present

## 2015-03-08 DIAGNOSIS — Z96652 Presence of left artificial knee joint: Secondary | ICD-10-CM | POA: Diagnosis present

## 2015-03-08 DIAGNOSIS — Z6835 Body mass index (BMI) 35.0-35.9, adult: Secondary | ICD-10-CM

## 2015-03-08 DIAGNOSIS — M25551 Pain in right hip: Secondary | ICD-10-CM | POA: Diagnosis present

## 2015-03-08 DIAGNOSIS — M169 Osteoarthritis of hip, unspecified: Secondary | ICD-10-CM | POA: Diagnosis present

## 2015-03-08 DIAGNOSIS — E669 Obesity, unspecified: Secondary | ICD-10-CM | POA: Diagnosis present

## 2015-03-08 DIAGNOSIS — Z96649 Presence of unspecified artificial hip joint: Secondary | ICD-10-CM

## 2015-03-08 HISTORY — PX: TOTAL HIP ARTHROPLASTY: SHX124

## 2015-03-08 LAB — TYPE AND SCREEN
ABO/RH(D): A POS
Antibody Screen: NEGATIVE

## 2015-03-08 SURGERY — ARTHROPLASTY, HIP, TOTAL, ANTERIOR APPROACH
Anesthesia: General | Site: Hip | Laterality: Right

## 2015-03-08 MED ORDER — BUPIVACAINE HCL (PF) 0.25 % IJ SOLN
INTRAMUSCULAR | Status: AC
  Filled 2015-03-08: qty 30

## 2015-03-08 MED ORDER — CEFAZOLIN SODIUM-DEXTROSE 2-3 GM-% IV SOLR
INTRAVENOUS | Status: AC
  Filled 2015-03-08: qty 50

## 2015-03-08 MED ORDER — ONDANSETRON HCL 4 MG PO TABS: 4.0000 mg | ORAL_TABLET | Freq: Four times a day (QID) | ORAL | Status: DC | PRN

## 2015-03-08 MED ORDER — METHOCARBAMOL 1000 MG/10ML IJ SOLN
500.0000 mg | Freq: Four times a day (QID) | INTRAMUSCULAR | Status: DC | PRN
  Administered 2015-03-08: 500 mg via INTRAVENOUS
  Filled 2015-03-08 (×2): qty 5

## 2015-03-08 MED ORDER — PROMETHAZINE HCL 25 MG/ML IJ SOLN
INTRAMUSCULAR | Status: AC
  Filled 2015-03-08: qty 1

## 2015-03-08 MED ORDER — CEFAZOLIN SODIUM-DEXTROSE 2-3 GM-% IV SOLR
2.0000 g | Freq: Four times a day (QID) | INTRAVENOUS | Status: AC
  Administered 2015-03-08 – 2015-03-09 (×2): 2 g via INTRAVENOUS
  Filled 2015-03-08 (×2): qty 50

## 2015-03-08 MED ORDER — ACETAMINOPHEN 325 MG PO TABS
650.0000 mg | ORAL_TABLET | Freq: Four times a day (QID) | ORAL | Status: DC | PRN
  Administered 2015-03-10: 650 mg via ORAL
  Filled 2015-03-08: qty 2

## 2015-03-08 MED ORDER — OXYCODONE HCL 5 MG PO TABS
5.0000 mg | ORAL_TABLET | ORAL | Status: DC | PRN
  Administered 2015-03-08 – 2015-03-10 (×5): 5 mg via ORAL
  Filled 2015-03-08 (×2): qty 1
  Filled 2015-03-08: qty 2
  Filled 2015-03-08: qty 1
  Filled 2015-03-08: qty 2

## 2015-03-08 MED ORDER — SUCCINYLCHOLINE CHLORIDE 20 MG/ML IJ SOLN
INTRAMUSCULAR | Status: DC | PRN
  Administered 2015-03-08: 100 mg via INTRAVENOUS

## 2015-03-08 MED ORDER — ACETAMINOPHEN 10 MG/ML IV SOLN
INTRAVENOUS | Status: AC
  Filled 2015-03-08: qty 100

## 2015-03-08 MED ORDER — CHLORHEXIDINE GLUCONATE 4 % EX LIQD: 60.0000 mL | Freq: Once | CUTANEOUS | Status: DC

## 2015-03-08 MED ORDER — ALBUTEROL SULFATE (2.5 MG/3ML) 0.083% IN NEBU: 2.5000 mg | INHALATION_SOLUTION | Freq: Four times a day (QID) | RESPIRATORY_TRACT | Status: DC | PRN

## 2015-03-08 MED ORDER — BISACODYL 10 MG RE SUPP: 10.0000 mg | Freq: Every day | RECTAL | Status: DC | PRN

## 2015-03-08 MED ORDER — METOPROLOL TARTRATE 50 MG PO TABS
50.0000 mg | ORAL_TABLET | Freq: Every day | ORAL | Status: DC
Start: 2015-03-08 — End: 2015-03-10
  Administered 2015-03-08 – 2015-03-09 (×2): 50 mg via ORAL
  Filled 2015-03-08 (×3): qty 1

## 2015-03-08 MED ORDER — METHOCARBAMOL 500 MG PO TABS: 500.0000 mg | ORAL_TABLET | Freq: Four times a day (QID) | ORAL | Status: DC | PRN

## 2015-03-08 MED ORDER — SODIUM CHLORIDE 0.9 % IV SOLN
INTRAVENOUS | Status: DC
Start: 2015-03-08 — End: 2015-03-08

## 2015-03-08 MED ORDER — PROPAFENONE HCL 150 MG PO TABS
150.0000 mg | ORAL_TABLET | Freq: Three times a day (TID) | ORAL | Status: DC
  Administered 2015-03-08 – 2015-03-10 (×5): 150 mg via ORAL
  Filled 2015-03-08 (×8): qty 1

## 2015-03-08 MED ORDER — OXYCODONE HCL 5 MG PO TABS: 5.0000 mg | ORAL_TABLET | Freq: Once | ORAL | Status: DC | PRN

## 2015-03-08 MED ORDER — PROPOFOL 10 MG/ML IV BOLUS
INTRAVENOUS | Status: AC
  Filled 2015-03-08: qty 20

## 2015-03-08 MED ORDER — ACETAMINOPHEN 650 MG RE SUPP: 650.0000 mg | Freq: Four times a day (QID) | RECTAL | Status: DC | PRN

## 2015-03-08 MED ORDER — GLYCOPYRROLATE 0.2 MG/ML IJ SOLN
INTRAMUSCULAR | Status: DC | PRN
  Administered 2015-03-08: .6 mg via INTRAVENOUS
  Administered 2015-03-08: 0.2 mg via INTRAVENOUS

## 2015-03-08 MED ORDER — FLEET ENEMA 7-19 GM/118ML RE ENEM: 1.0000 | ENEMA | Freq: Once | RECTAL | Status: DC | PRN

## 2015-03-08 MED ORDER — ONDANSETRON HCL 4 MG/2ML IJ SOLN: 4.0000 mg | Freq: Once | INTRAMUSCULAR | Status: DC | PRN

## 2015-03-08 MED ORDER — MIDAZOLAM HCL 5 MG/5ML IJ SOLN
INTRAMUSCULAR | Status: DC | PRN
  Administered 2015-03-08: 2 mg via INTRAVENOUS

## 2015-03-08 MED ORDER — PHENOL 1.4 % MT LIQD: 1.0000 | OROMUCOSAL | Status: DC | PRN

## 2015-03-08 MED ORDER — DOCUSATE SODIUM 100 MG PO CAPS
100.0000 mg | ORAL_CAPSULE | Freq: Two times a day (BID) | ORAL | Status: DC
  Administered 2015-03-09 – 2015-03-10 (×3): 100 mg via ORAL

## 2015-03-08 MED ORDER — FENTANYL CITRATE (PF) 250 MCG/5ML IJ SOLN
INTRAMUSCULAR | Status: AC
  Filled 2015-03-08: qty 5

## 2015-03-08 MED ORDER — MENTHOL 3 MG MT LOZG: 1.0000 | LOZENGE | OROMUCOSAL | Status: DC | PRN

## 2015-03-08 MED ORDER — ONDANSETRON HCL 4 MG/2ML IJ SOLN: 4.0000 mg | Freq: Four times a day (QID) | INTRAMUSCULAR | Status: DC | PRN

## 2015-03-08 MED ORDER — FENTANYL CITRATE (PF) 100 MCG/2ML IJ SOLN
INTRAMUSCULAR | Status: DC | PRN
  Administered 2015-03-08: 50 ug via INTRAVENOUS
  Administered 2015-03-08: 100 ug via INTRAVENOUS
  Administered 2015-03-08 (×2): 50 ug via INTRAVENOUS
  Administered 2015-03-08: 100 ug via INTRAVENOUS

## 2015-03-08 MED ORDER — ONDANSETRON HCL 4 MG/2ML IJ SOLN
INTRAMUSCULAR | Status: DC | PRN
  Administered 2015-03-08: 4 mg via INTRAVENOUS

## 2015-03-08 MED ORDER — ROCURONIUM BROMIDE 100 MG/10ML IV SOLN
INTRAVENOUS | Status: DC | PRN
  Administered 2015-03-08: 40 mg via INTRAVENOUS

## 2015-03-08 MED ORDER — METOCLOPRAMIDE HCL 10 MG PO TABS: 5.0000 mg | ORAL_TABLET | Freq: Three times a day (TID) | ORAL | Status: DC | PRN

## 2015-03-08 MED ORDER — PROPOFOL 500 MG/50ML IV EMUL
INTRAVENOUS | Status: DC | PRN
  Administered 2015-03-08: 160 mg via INTRAVENOUS

## 2015-03-08 MED ORDER — METOCLOPRAMIDE HCL 5 MG/ML IJ SOLN: 5.0000 mg | Freq: Three times a day (TID) | INTRAMUSCULAR | Status: DC | PRN

## 2015-03-08 MED ORDER — PROMETHAZINE HCL 25 MG/ML IJ SOLN
6.2500 mg | INTRAMUSCULAR | Status: DC | PRN
  Administered 2015-03-08: 6.25 mg via INTRAVENOUS

## 2015-03-08 MED ORDER — NEOSTIGMINE METHYLSULFATE 10 MG/10ML IV SOLN
INTRAVENOUS | Status: AC
  Filled 2015-03-08: qty 1

## 2015-03-08 MED ORDER — CEFAZOLIN SODIUM-DEXTROSE 2-3 GM-% IV SOLR
2.0000 g | INTRAVENOUS | Status: AC
  Administered 2015-03-08: 2 g via INTRAVENOUS

## 2015-03-08 MED ORDER — MORPHINE SULFATE (PF) 2 MG/ML IV SOLN
1.0000 mg | INTRAVENOUS | Status: DC | PRN
Start: 2015-03-08 — End: 2015-03-10
  Administered 2015-03-08: 1 mg via INTRAVENOUS
  Filled 2015-03-08: qty 1

## 2015-03-08 MED ORDER — PANTOPRAZOLE SODIUM 40 MG PO TBEC
40.0000 mg | DELAYED_RELEASE_TABLET | Freq: Every day | ORAL | Status: DC
  Filled 2015-03-08: qty 1

## 2015-03-08 MED ORDER — GLYCOPYRROLATE 0.2 MG/ML IJ SOLN
INTRAMUSCULAR | Status: AC
Start: 2015-03-08 — End: 2015-03-08
  Filled 2015-03-08: qty 3

## 2015-03-08 MED ORDER — OXYCODONE HCL 5 MG/5ML PO SOLN: 5.0000 mg | Freq: Once | ORAL | Status: DC | PRN

## 2015-03-08 MED ORDER — DEXAMETHASONE SODIUM PHOSPHATE 10 MG/ML IJ SOLN
10.0000 mg | Freq: Once | INTRAMUSCULAR | Status: AC
  Administered 2015-03-09: 10 mg via INTRAVENOUS
  Filled 2015-03-08: qty 1

## 2015-03-08 MED ORDER — 0.9 % SODIUM CHLORIDE (POUR BTL) OPTIME
TOPICAL | Status: DC | PRN
  Administered 2015-03-08: 1000 mL

## 2015-03-08 MED ORDER — LORATADINE 10 MG PO TABS
10.0000 mg | ORAL_TABLET | Freq: Every day | ORAL | Status: DC
  Administered 2015-03-09: 10 mg via ORAL
  Filled 2015-03-08 (×3): qty 1

## 2015-03-08 MED ORDER — HYDROMORPHONE HCL 1 MG/ML IJ SOLN
INTRAMUSCULAR | Status: AC
  Filled 2015-03-08: qty 1

## 2015-03-08 MED ORDER — ACETAMINOPHEN 500 MG PO TABS
1000.0000 mg | ORAL_TABLET | Freq: Four times a day (QID) | ORAL | Status: AC
  Administered 2015-03-08 – 2015-03-09 (×4): 1000 mg via ORAL
  Filled 2015-03-08 (×5): qty 2

## 2015-03-08 MED ORDER — DIPHENHYDRAMINE HCL 12.5 MG/5ML PO ELIX: 12.5000 mg | ORAL_SOLUTION | ORAL | Status: DC | PRN

## 2015-03-08 MED ORDER — RIVAROXABAN 10 MG PO TABS
10.0000 mg | ORAL_TABLET | Freq: Every day | ORAL | Status: DC
  Administered 2015-03-09 – 2015-03-10 (×2): 10 mg via ORAL
  Filled 2015-03-08 (×3): qty 1

## 2015-03-08 MED ORDER — MIDAZOLAM HCL 2 MG/2ML IJ SOLN
INTRAMUSCULAR | Status: AC
  Filled 2015-03-08: qty 2

## 2015-03-08 MED ORDER — METOPROLOL TARTRATE 25 MG PO TABS
25.0000 mg | ORAL_TABLET | Freq: Every day | ORAL | Status: DC
  Administered 2015-03-09 – 2015-03-10 (×2): 25 mg via ORAL
  Filled 2015-03-08 (×2): qty 1

## 2015-03-08 MED ORDER — DEXAMETHASONE SODIUM PHOSPHATE 10 MG/ML IJ SOLN
10.0000 mg | Freq: Once | INTRAMUSCULAR | Status: AC
  Administered 2015-03-08: 10 mg via INTRAVENOUS

## 2015-03-08 MED ORDER — FLUTICASONE PROPIONATE 50 MCG/ACT NA SUSP
2.0000 | Freq: Every day | NASAL | Status: DC
  Administered 2015-03-09 – 2015-03-10 (×2): 2 via NASAL
  Filled 2015-03-08 (×2): qty 16

## 2015-03-08 MED ORDER — NEOSTIGMINE METHYLSULFATE 10 MG/10ML IV SOLN
INTRAVENOUS | Status: DC | PRN
  Administered 2015-03-08: 4 mg via INTRAVENOUS

## 2015-03-08 MED ORDER — ACETAMINOPHEN 10 MG/ML IV SOLN
1000.0000 mg | Freq: Once | INTRAVENOUS | Status: AC
  Administered 2015-03-08: 1000 mg via INTRAVENOUS

## 2015-03-08 MED ORDER — LACTATED RINGERS IV SOLN
INTRAVENOUS | Status: DC
  Administered 2015-03-08: 14:00:00 via INTRAVENOUS
  Administered 2015-03-08: 1000 mL via INTRAVENOUS
  Administered 2015-03-08: 15:00:00 via INTRAVENOUS

## 2015-03-08 MED ORDER — FENTANYL CITRATE (PF) 100 MCG/2ML IJ SOLN
INTRAMUSCULAR | Status: AC
  Filled 2015-03-08: qty 2

## 2015-03-08 MED ORDER — HYDROMORPHONE HCL 1 MG/ML IJ SOLN
0.2500 mg | INTRAMUSCULAR | Status: DC | PRN
  Administered 2015-03-08 (×4): 0.25 mg via INTRAVENOUS

## 2015-03-08 MED ORDER — SODIUM CHLORIDE 0.9 % IV SOLN
INTRAVENOUS | Status: DC
  Administered 2015-03-08: 18:00:00 via INTRAVENOUS

## 2015-03-08 MED ORDER — BUPIVACAINE HCL (PF) 0.25 % IJ SOLN
INTRAMUSCULAR | Status: DC | PRN
  Administered 2015-03-08: 30 mL

## 2015-03-08 MED ORDER — TRAMADOL HCL 50 MG PO TABS: 50.0000 mg | ORAL_TABLET | Freq: Four times a day (QID) | ORAL | Status: DC | PRN

## 2015-03-08 MED ORDER — TRANEXAMIC ACID 1000 MG/10ML IV SOLN
1000.0000 mg | INTRAVENOUS | Status: AC
  Administered 2015-03-08: 1000 mg via INTRAVENOUS
  Filled 2015-03-08: qty 10

## 2015-03-08 MED ORDER — LIDOCAINE HCL (CARDIAC) 20 MG/ML IV SOLN
INTRAVENOUS | Status: DC | PRN
  Administered 2015-03-08: 50 mg via INTRAVENOUS

## 2015-03-08 MED ORDER — POLYETHYLENE GLYCOL 3350 17 G PO PACK: 17.0000 g | PACK | Freq: Every day | ORAL | Status: DC | PRN

## 2015-03-08 MED ORDER — STERILE WATER FOR IRRIGATION IR SOLN
Status: DC | PRN
  Administered 2015-03-08: 1000 mL

## 2015-03-08 SURGICAL SUPPLY — 35 items
BAG DECANTER FOR FLEXI CONT (MISCELLANEOUS) IMPLANT
BAG SPEC THK2 15X12 ZIP CLS (MISCELLANEOUS)
BAG ZIPLOCK 12X15 (MISCELLANEOUS) IMPLANT
BLADE SAG 18X100X1.27 (BLADE) ×2 IMPLANT
CAPT HIP TOTAL 2 ×2 IMPLANT
CLOTH BEACON ORANGE TIMEOUT ST (SAFETY) ×2 IMPLANT
COVER PERINEAL POST (MISCELLANEOUS) ×2 IMPLANT
DECANTER SPIKE VIAL GLASS SM (MISCELLANEOUS) IMPLANT
DRAPE STERI IOBAN 125X83 (DRAPES) ×2 IMPLANT
DRAPE U-SHAPE 47X51 STRL (DRAPES) ×4 IMPLANT
DRSG ADAPTIC 3X8 NADH LF (GAUZE/BANDAGES/DRESSINGS) ×2 IMPLANT
DRSG MEPILEX BORDER 4X4 (GAUZE/BANDAGES/DRESSINGS) ×2 IMPLANT
DRSG MEPILEX BORDER 4X8 (GAUZE/BANDAGES/DRESSINGS) ×2 IMPLANT
DURAPREP 26ML APPLICATOR (WOUND CARE) ×2 IMPLANT
ELECT REM PT RETURN 9FT ADLT (ELECTROSURGICAL) ×2
ELECTRODE REM PT RTRN 9FT ADLT (ELECTROSURGICAL) ×1 IMPLANT
EVACUATOR 1/8 PVC DRAIN (DRAIN) ×2 IMPLANT
GLOVE BIO SURGEON STRL SZ7.5 (GLOVE) ×2 IMPLANT
GLOVE BIO SURGEON STRL SZ8 (GLOVE) ×2 IMPLANT
GLOVE BIOGEL PI IND STRL 8 (GLOVE) ×2 IMPLANT
GLOVE BIOGEL PI INDICATOR 8 (GLOVE) ×2
GOWN STRL REUS W/TWL LRG LVL3 (GOWN DISPOSABLE) ×2 IMPLANT
GOWN STRL REUS W/TWL XL LVL3 (GOWN DISPOSABLE) ×2 IMPLANT
PACK ANTERIOR HIP CUSTOM (KITS) ×2 IMPLANT
STRIP CLOSURE SKIN 1/2X4 (GAUZE/BANDAGES/DRESSINGS) ×2 IMPLANT
SUT ETHIBOND NAB CT1 #1 30IN (SUTURE) ×2 IMPLANT
SUT MNCRL AB 4-0 PS2 18 (SUTURE) ×2 IMPLANT
SUT STRATAFIX 1PDS 45CM VIOLET (SUTURE) ×1 IMPLANT
SUT VIC AB 2-0 CT1 27 (SUTURE) ×6
SUT VIC AB 2-0 CT1 TAPERPNT 27 (SUTURE) ×3 IMPLANT
SUT VLOC 180 0 24IN GS25 (SUTURE) IMPLANT
SYR 50ML LL SCALE MARK (SYRINGE) IMPLANT
TRAY FOLEY W/METER SILVER 14FR (SET/KITS/TRAYS/PACK) ×2 IMPLANT
TRAY FOLEY W/METER SILVER 16FR (SET/KITS/TRAYS/PACK) IMPLANT
YANKAUER SUCT BULB TIP 10FT TU (MISCELLANEOUS) ×2 IMPLANT

## 2015-03-08 NOTE — Transfer of Care (Signed)
Immediate Anesthesia Transfer of Care Note  Patient: Donna Moon  Procedure(s) Performed: Procedure(s): TOTAL RIGHT  HIP ARTHROPLASTY ANTERIOR APPROACH (Right)  Patient Location: PACU  Anesthesia Type:General  Level of Consciousness:  sedated, patient cooperative and responds to stimulation  Airway & Oxygen Therapy:Patient Spontanous Breathing and Patient connected to face mask oxgen  Post-op Assessment:  Report given to PACU RN and Post -op Vital signs reviewed and stable  Post vital signs:  Reviewed and stable  Last Vitals:  Filed Vitals:   03/08/15 1142  BP: 166/64  Pulse: 63  Temp: 36.7 C  Resp: 16    Complications: No apparent anesthesia complications

## 2015-03-08 NOTE — Anesthesia Preprocedure Evaluation (Signed)
Anesthesia Evaluation  Patient identified by MRN, date of birth, ID band Patient awake    Reviewed: Allergy & Precautions, NPO status , Patient's Chart, lab work & pertinent test results  Airway Mallampati: II  TM Distance: >3 FB Neck ROM: Full    Dental  (+) Teeth Intact, Dental Advisory Given   Pulmonary former smoker,    breath sounds clear to auscultation       Cardiovascular  Rhythm:Regular Rate:Normal     Neuro/Psych    GI/Hepatic   Endo/Other    Renal/GU      Musculoskeletal   Abdominal (+) + obese,   Peds  Hematology   Anesthesia Other Findings   Reproductive/Obstetrics                             Anesthesia Physical Anesthesia Plan  ASA: III  Anesthesia Plan: General   Post-op Pain Management:    Induction: Intravenous  Airway Management Planned: Oral ETT  Additional Equipment:   Intra-op Plan:   Post-operative Plan: Extubation in OR  Informed Consent: I have reviewed the patients History and Physical, chart, labs and discussed the procedure including the risks, benefits and alternatives for the proposed anesthesia with the patient or authorized representative who has indicated his/her understanding and acceptance.   Dental advisory given  Plan Discussed with: CRNA and Anesthesiologist  Anesthesia Plan Comments: (DJD R. Hip Paroxysmal Afib now in SR H/O Post-op Nausea and vomiting Asthma  Plan GA with oral ETT  Roberts Gaudy)        Anesthesia Quick Evaluation

## 2015-03-08 NOTE — Anesthesia Procedure Notes (Signed)
Procedure Name: Intubation Performed by: Zakiyah Diop J Pre-anesthesia Checklist: Patient identified, Emergency Drugs available, Suction available, Patient being monitored and Timeout performed Patient Re-evaluated:Patient Re-evaluated prior to inductionOxygen Delivery Method: Circle system utilized Preoxygenation: Pre-oxygenation with 100% oxygen Intubation Type: IV induction Ventilation: Mask ventilation without difficulty Laryngoscope Size: Mac and 3 Grade View: Grade I Tube type: Oral Tube size: 7.0 mm Number of attempts: 1 Airway Equipment and Method: Stylet Placement Confirmation: ETT inserted through vocal cords under direct vision,  positive ETCO2,  CO2 detector and breath sounds checked- equal and bilateral Secured at: 21 cm Tube secured with: Tape Dental Injury: Teeth and Oropharynx as per pre-operative assessment        

## 2015-03-08 NOTE — Interval H&P Note (Signed)
History and Physical Interval Note:  03/08/2015 1:04 PM  Donna Moon  has presented today for surgery, with the diagnosis of OA RIGHT HIP   The various methods of treatment have been discussed with the patient and family. After consideration of risks, benefits and other options for treatment, the patient has consented to  Procedure(s): TOTAL RIGHT  HIP ARTHROPLASTY ANTERIOR APPROACH (Right) as a surgical intervention .  The patient's history has been reviewed, patient examined, no change in status, stable for surgery.  I have reviewed the patient's chart and labs.  Questions were answered to the patient's satisfaction.     Gearlean Alf

## 2015-03-08 NOTE — Anesthesia Postprocedure Evaluation (Signed)
Anesthesia Post Note  Patient: Donna Moon  Procedure(s) Performed: Procedure(s) (LRB): TOTAL RIGHT  HIP ARTHROPLASTY ANTERIOR APPROACH (Right)  Patient location during evaluation: PACU Anesthesia Type: General Level of consciousness: awake Pain management: pain level controlled Vital Signs Assessment: post-procedure vital signs reviewed and stable Respiratory status: spontaneous breathing and nonlabored ventilation Cardiovascular status: blood pressure returned to baseline Postop Assessment: no headache Anesthetic complications: no    Last Vitals:  Filed Vitals:   03/08/15 1700 03/08/15 1715  BP:  135/64  Pulse:  56  Temp: 37 C 36.7 C  Resp: 15 12    Last Pain:  Filed Vitals:   03/08/15 1753  PainSc: 8     LLE Motor Response: Purposeful movement (03/08/15 1745) LLE Sensation: Full sensation (03/08/15 1745) RLE Motor Response: Purposeful movement (03/08/15 1745) RLE Sensation: Full sensation (03/08/15 1745)      Daphna Lafuente COKER

## 2015-03-08 NOTE — H&P (View-Only) (Signed)
Donna Moon DOB: 04/27/1950 Married / Language: English / Race: White Female Date of Admission:  03/08/2015 CC:  Right Hip Pain History of Present Illness The patient is a 64 year old female who comes in  for a preoperative History and Physical. The patient is scheduled for a right total hip arthroplasty (anterior) to be performed by Dr. Dione Plover. Aluisio, MD at [facility] on 03-08-2015. The patient is a 64 year old female who presented for follow up of their hip. The patient is being followed for their right hip pain and osteoarthritis. Symptoms reported include: pain, pain when weightbearing, pt. can't bend over to the floor, and giving way. The patient feels that they are doing better but not good and reported their pain level to be mild to moderate. Current treatment includes: use of a cane. The following medication has been used for pain control: antiinflammatory medication (Ibuprofen). The patient presented following an MRI weeks ago. We went over MRI scan. She has got some collapse of femoral head, a horrible stress reaction throughout the femoral head and neck as well as degenerative change in the hip. She was tried to take care of this with conservative means and modifified acitvity. Despite these measures, she continues to have pain and dysfunction. They have been treated conservatively in the past for the above stated problem and despite conservative measures, they continue to have progressive pain and severe functional limitations and dysfunction. They have failed non-operative management including home exercise, medications. It is felt that they would benefit from undergoing total joint replacement. Risks and benefits of the procedure have been discussed with the patient and they elect to proceed with surgery. There are no active contraindications to surgery such as ongoing infection or rapidly progressive neurological disease.  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 mother Cancer  Father, Mother, Sister. mother and father mother, father and brother Rheumatoid Arthritis  sister Hypertension  sister Kidney disease  Mother. mother, sister and child Depression  Mother. Heart disease in female family member before age 71  Heart Disease  First Degree Relatives, Mother. mother and father Heart disease in female family member before age 27  Osteoarthritis  Mother. mother and sister First Degree Relatives  reported  Social History Exercise  Exercises weekly; does other and gym / weights Exercises weekly; does individual sport, other, gym / weights and team sport Exercises weekly; does gym / weights Living situation  live with spouse Children  1 Current work status  retired No history of drug/alcohol rehab  Not under pain contract  Marital status  married Never consumed alcohol  09/20/2013: Never consumed alcohol Tobacco use  Former smoker. 09/20/2013: smoke(d) 1 pack(s) per day never smoker former smoker Number of flights of stairs before winded  less than 1 2-3 Tobacco / smoke exposure  09/20/2013: no no Pain Contract  no Alcohol use  never consumed alcohol Drug/Alcohol Rehab (Currently)  no Drug/Alcohol Rehab (Previously)  no Post-Surgical Plans  Home following the Right Total Hip  Repalcement Previously in rehab  no Illicit drug use  no  Medication History Ibuprofen (800MG  Tablet, Oral) Active. Metoprolol Tartrate (25MG  Tablet, Oral) Active. Calcium Carbonate (600MG  Tablet, Oral) Active. Propafenone HCl (150MG  Tablet, Oral) Active. Prevacid (30MG  Capsule DR, Oral) Active. Aspirin (325MG  Tablet, Oral) Active.  Past Surgical History Cardiac Catherterization  Cardiac Ablation Procedures  2006, 2012 Dilation and Curettage of Uterus  Rotator Cuff Repair  right Carpal Tunnel Repair  bilateral; 1992 and 1993 Cesarean Delivery  Date: 1986. 1 time Arthroscopy of Knee  left Arthroscopy of Shoulder  left right Total Knee Replacement  Date: 2014. left Hip Fracture and Surgery  Date: 2012. left Total Hip Replacement  Date: 2010. left, again in 2013 Appendectomy  Date: 1968.  Review of Systems General Not Present- Chills, Fatigue, Fever, Memory Loss, Night Sweats, Weight Gain and Weight Loss. Skin Not Present- Eczema, Hives, Itching, Lesions and Rash. HEENT Not Present- Dentures, Double Vision, Headache, Hearing Loss, Tinnitus and Visual Loss. Respiratory Not Present- Allergies, Chronic Cough, Coughing up blood, Shortness of breath at rest and Shortness of breath with exertion. Cardiovascular Present- Palpitations. Not Present- Chest Pain, Difficulty Breathing Lying Down, Murmur, Racing/skipping heartbeats and Swelling. Gastrointestinal Not Present- Abdominal Pain, Bloody Stool, Constipation, Diarrhea, Difficulty Swallowing, Heartburn, Jaundice, Loss of appetitie, Nausea and Vomiting. Female Genitourinary Present- Blood in Urine (chronic, longterm which has been worked up). Not Present- Discharge, Flank Pain, Incontinence, Painful Urination, Urgency, Urinary frequency, Urinary Retention, Urinating at Night and Weak urinary stream. Musculoskeletal Present- Joint Pain. Not Present- Back Pain, Joint Swelling, Morning Stiffness, Muscle Pain, Muscle Weakness  and Spasms. Neurological Not Present- Blackout spells, Difficulty with balance, Dizziness, Paralysis, Tremor and Weakness. Psychiatric Not Present- Insomnia.  Vitals Weight: 231 lb Height: 68in Body Surface Area: 2.17 m Body Mass Index: 35.12 kg/m  BP: 132/47 (Sitting, Left Arm, Standard)   Physical Exam General Mental Status -Alert, cooperative and good historian. General Appearance-pleasant, Not in acute distress. Orientation-Oriented X3. Build & Nutrition-Well nourished and Well developed.  Head and Neck Head-normocephalic, atraumatic . Neck Global Assessment - supple, no bruit auscultated on the right, no bruit auscultated on the left.  Eye Pupil - Bilateral-Regular and Round. Motion - Bilateral-EOMI.  Chest and Lung Exam Auscultation Breath sounds - clear at anterior chest wall and clear at posterior chest wall. Adventitious sounds - No Adventitious sounds.  Cardiovascular Auscultation Rhythm - Regular rate and rhythm. Heart Sounds - S1 WNL and S2 WNL. Murmurs & Other Heart Sounds - Auscultation of the heart reveals - No Murmurs.  Abdomen Inspection Contour - Generalized mild distention. Palpation/Percussion Tenderness - Abdomen is non-tender to palpation. Rigidity (guarding) - Abdomen is soft. Auscultation Auscultation of the abdomen reveals - Bowel sounds normal.  Female Genitourinary Note: Not done, not pertinent to present illness   Musculoskeletal Note: On exam, well-developed female, in no distress. Right hip can be flexed to 100, rotate in 20, out 30, abduct 30 with pain.  We went over MRI scan. She has got some collapse of femoral head, a horrible stress reaction throughout the femoral head and neck as well as degenerative change in the hip.  Assessment & Plan Primary osteoarthritis of right hip (M16.11)  Note:Surgical Plans: Right Total Hip Replacement - Anterior Approach  Disposition: Home with family  Cards: Dr.  Sallyanne Kuster - Patient has been seen preoperatively and felt to be stable for surgery. "Donna Moon is at low risk from a cardiac  standpoint for the upcoming procedure. Please avoid interruption of her cardiac medications, especially metoprolo."  IV TXA  Anesthesia Issues: None  Signed electronically by Joelene Millin, III PA-C

## 2015-03-08 NOTE — Op Note (Signed)
OPERATIVE REPORT  PREOPERATIVE DIAGNOSIS: Osteoarthritis of the Right hip.   POSTOPERATIVE DIAGNOSIS: Osteoarthritis of the Right  hip.   PROCEDURE: Right total hip arthroplasty, anterior approach.   SURGEON: Gaynelle Arabian, MD   ASSISTANT: Arlee Muslim, PA-C  ANESTHESIA:  General  ESTIMATED BLOOD LOSS:-400 ml   DRAINS: Hemovac x1.   COMPLICATIONS: None   CONDITION: PACU - hemodynamically stable.   BRIEF CLINICAL NOTE: Donna Moon is a 64 y.o. female who has advanced end-  stage arthritis of her Right  hip with progressively worsening pain and  dysfunction.The patient has failed nonoperative management and presents for  total hip arthroplasty.   PROCEDURE IN DETAIL: After successful administration of spinal  anesthetic, the traction boots for the [facility] bed were placed on both  feet and the patient was placed onto the [facility] bed, boots placed into the leg  holders. The Right hip was then isolated from the perineum with plastic  drapes and prepped and draped in the usual sterile fashion. ASIS and  greater trochanter were marked and a oblique incision was made, starting  at about 1 cm lateral and 2 cm distal to the ASIS and coursing towards  the anterior cortex of the femur. The skin was cut with a 10 blade  through subcutaneous tissue to the level of the fascia overlying the  tensor fascia lata muscle. The fascia was then incised in line with the  incision at the junction of the anterior third and posterior 2/3rd. The  muscle was teased off the fascia and then the interval between the TFL  and the rectus was developed. The Hohmann retractor was then placed at  the top of the femoral neck over the capsule. The vessels overlying the  capsule were cauterized and the fat on top of the capsule was removed.  A Hohmann retractor was then placed anterior underneath the rectus  femoris to give exposure to the entire anterior capsule. A T-shaped  capsulotomy was performed. The  edges were tagged and the femoral head  was identified.       Osteophytes are removed off the superior acetabulum.  The femoral neck was then cut in situ with an oscillating saw. Traction  was then applied to the left lower extremity utilizing the [facility]  traction. The femoral head was then removed. Retractors were placed  around the acetabulum and then circumferential removal of the labrum was  performed. Osteophytes were also removed. Reaming starts at 45 mm to  medialize and  Increased in 2 mm increments to 51 mm. We reamed in  approximately 40 degrees of abduction, 20 degrees anteversion. A 52 mm  pinnacle acetabular shell was then impacted in anatomic position under  fluoroscopic guidance with excellent purchase. We did not need to place  any additional dome screws. A 32 mm neutral + 4 marathon liner was then  placed into the acetabular shell.       The femoral lift was then placed along the lateral aspect of the femur  just distal to the vastus ridge. The leg was  externally rotated and capsule  was stripped off the inferior aspect of the femoral neck down to the  level of the lesser trochanter, this was done with electrocautery. The femur was lifted after this was performed. The  leg was then placed and extended in adducted position to essentially delivering the femur. We also removed the capsule superiorly and the  piriformis from the piriformis  fossa to gain excellent exposure of the  proximal femur. Rongeur was used to remove some cancellous bone to get  into the lateral portion of the proximal femur for placement of the  initial starter reamer. The starter broaches was placed  the starter broach  and was shown to go down the center of the canal. Broaching  with the  Corail system was then performed starting at size 8, coursing  Up to size 12. A size 12 had excellent torsional and rotational  and axial stability. The trial standard offset neck was then placed  with a 32 + 1 trial  head. The hip was then reduced. We confirmed that  the stem was in the canal both on AP and lateral x-rays. It also has excellent sizing. The hip was reduced with outstanding stability through full extension, full external rotation,  and then flexion in adduction internal rotation. AP pelvis was taken  and the leg lengths were measured and found to be exactly equal. Hip  was then dislocated again and the femoral head and neck removed. The  femoral broach was removed. Size 12 Corail stem with a standard offset  neck was then impacted into the femur following native anteversion. Has  excellent purchase in the canal. Excellent torsional and rotational and  axial stability. It is confirmed to be in the canal on AP and lateral  fluoroscopic views. The 32 + 1 ceramic head was placed and the hip  reduced with outstanding stability. Again AP pelvis was taken and it  confirmed that the leg lengths were equal. The wound was then copiously  irrigated with saline solution and the capsule reattached and repaired  with Ethibond suture. 30 ml of .25% Bupivicaine injected into the capsule and into the edge of the tensor fascia lata as well as subcutaneous tissue. The fascia overlying the tensor fascia lata was  then closed with a running #1 V-Loc. Subcu was closed with interrupted  2-0 Vicryl and subcuticular running 4-0 Monocryl. Incision was cleaned  and dried. Steri-Strips and a bulky sterile dressing applied. Hemovac  drain was hooked to suction and then he was awakened and transported to  recovery in stable condition.        Please note that a surgical assistant was a medical necessity for this procedure to perform it in a safe and expeditious manner. Assistant was necessary to provide appropriate retraction of vital neurovascular structures and to prevent femoral fracture and allow for anatomic placement of the prosthesis.  Gaynelle Arabian, M.D.

## 2015-03-09 ENCOUNTER — Encounter (HOSPITAL_COMMUNITY): Payer: Self-pay | Admitting: Orthopedic Surgery

## 2015-03-09 LAB — BASIC METABOLIC PANEL
Anion gap: 8 (ref 5–15)
BUN: 15 mg/dL (ref 6–20)
CHLORIDE: 106 mmol/L (ref 101–111)
CO2: 26 mmol/L (ref 22–32)
CREATININE: 0.97 mg/dL (ref 0.44–1.00)
Calcium: 9.2 mg/dL (ref 8.9–10.3)
GFR calc Af Amer: >60 mL/min (ref >60)
GFR calc non Af Amer: >60 mL/min (ref >60)
GLUCOSE: 141 mg/dL — AB (ref 65–99)
Potassium: 4.4 mmol/L (ref 3.5–5.1)
SODIUM: 140 mmol/L (ref 135–145)

## 2015-03-09 LAB — CBC
HEMATOCRIT: 35.7 % — AB (ref 36.0–46.0)
Hemoglobin: 12.1 g/dL (ref 12.0–15.0)
MCH: 31.3 pg (ref 26.0–34.0)
MCHC: 33.9 g/dL (ref 30.0–36.0)
MCV: 92.2 fL (ref 78.0–100.0)
PLATELETS: 182 10*3/uL (ref 150–400)
RBC: 3.87 MIL/uL (ref 3.87–5.11)
RDW: 13 % (ref 11.5–15.5)
WBC: 10.9 10*3/uL — AB (ref 4.0–10.5)

## 2015-03-09 MED ORDER — LANSOPRAZOLE 30 MG PO CPDR
30.0000 mg | DELAYED_RELEASE_CAPSULE | Freq: Every day | ORAL | Status: DC
  Administered 2015-03-09 – 2015-03-10 (×2): 30 mg via ORAL
  Filled 2015-03-09 (×3): qty 1

## 2015-03-09 NOTE — Discharge Instructions (Addendum)
° °Dr. Frank Aluisio °Total Joint Specialist °[facility] °[address] °[place], [place] [address] °[phone] ° °ANTERIOR APPROACH TOTAL HIP REPLACEMENT POSTOPERATIVE DIRECTIONS ° ° °Hip Rehabilitation, Guidelines Following Surgery  °The results of a hip operation are greatly improved after range of motion and muscle strengthening exercises. Follow all safety measures which are given to protect your hip. If any of these exercises cause increased pain or swelling in your joint, decrease the amount until you are comfortable again. Then slowly increase the exercises. Call your caregiver if you have problems or questions.  ° °HOME CARE INSTRUCTIONS  °Remove items at home which could result in a fall. This includes throw rugs or furniture in walking pathways.  °· ICE to the affected hip every three hours for 30 minutes at a time and then as needed for pain and swelling.  Continue to use ice on the hip for pain and swelling from surgery. You may notice swelling that will progress down to the foot and ankle.  This is normal after surgery.  Elevate the leg when you are not up walking on it.   °· Continue to use the breathing machine which will help keep your temperature down.  It is common for your temperature to cycle up and down following surgery, especially at night when you are not up moving around and exerting yourself.  The breathing machine keeps your lungs expanded and your temperature down. ° ° °DIET °You may resume your previous home diet once your are discharged from the hospital. ° °DRESSING / WOUND CARE / SHOWERING °You may shower 3 days after surgery, but keep the wounds dry during showering.  You may use an occlusive plastic wrap (Press'n Seal for example), NO SOAKING/SUBMERGING IN THE BATHTUB.  If the bandage gets wet, change with a clean dry gauze.  If the incision gets wet, pat the wound dry with a clean towel. °You may start showering once you are discharged home but do not  submerge the incision under water. Just pat the incision dry and apply a dry gauze dressing on daily. °Change the surgical dressing daily and reapply a dry dressing each time. ° °ACTIVITY °Walk with your walker as instructed. °Use walker as long as suggested by your caregivers. °Avoid periods of inactivity such as sitting longer than an hour when not asleep. This helps prevent blood clots.  °You may resume a sexual relationship in one month or when given the OK by your doctor.  °You may return to work once you are cleared by your doctor.  °Do not drive a car for 6 weeks or until released by you surgeon.  °Do not drive while taking narcotics. ° °WEIGHT BEARING °Weight bearing as tolerated with assist device (walker, cane, etc) as directed, use it as long as suggested by your surgeon or therapist, typically at least 4-6 weeks. ° °POSTOPERATIVE CONSTIPATION PROTOCOL °Constipation - defined medically as fewer than three stools per week and severe constipation as less than one stool per week. ° °One of the most common issues patients have following surgery is constipation.  Even if you have a regular bowel pattern at home, your normal regimen is likely to be disrupted due to multiple reasons following surgery.  Combination of anesthesia, postoperative narcotics, change in appetite and fluid intake all can affect your bowels.  In order to avoid complications following surgery, here are some recommendations in order to help you during your recovery period. ° °Colace (docusate) - Pick up an over-the-counter   form of Colace or another stool softener and take twice a day as long as you are requiring postoperative pain medications.  Take with a full glass of water daily.  If you experience loose stools or diarrhea, hold the colace until you stool forms back up.  If your symptoms do not get better within 1 week or if they get worse, check with your doctor. ° °Dulcolax (bisacodyl) - Pick up over-the-counter and take as directed  by the product packaging as needed to assist with the movement of your bowels.  Take with a full glass of water.  Use this product as needed if not relieved by Colace only.  ° °MiraLax (polyethylene glycol) - Pick up over-the-counter to have on hand.  MiraLax is a solution that will increase the amount of water in your bowels to assist with bowel movements.  Take as directed and can mix with a glass of water, juice, soda, coffee, or tea.  Take if you go more than two days without a movement. °Do not use MiraLax more than once per day. Call your doctor if you are still constipated or irregular after using this medication for 7 days in a row. ° °If you continue to have problems with postoperative constipation, please contact the office for further assistance and recommendations.  If you experience "the worst abdominal pain ever" or develop nausea or vomiting, please contact the office immediatly for further recommendations for treatment. ° °ITCHING ° If you experience itching with your medications, try taking only a single pain pill, or even half a pain pill at a time.  You can also use Benadryl over the counter for itching or also to help with sleep.  ° °TED HOSE STOCKINGS °Wear the elastic stockings on both legs for three weeks following surgery during the day but you may remove then at night for sleeping. ° °MEDICATIONS °See your medication summary on the “After Visit Summary” that the nursing staff will review with you prior to discharge.  You may have some home medications which will be placed on hold until you complete the course of blood thinner medication.  It is important for you to complete the blood thinner medication as prescribed by your surgeon.  Continue your approved medications as instructed at time of discharge. ° °PRECAUTIONS °If you experience chest pain or shortness of breath - call 911 immediately for transfer to the hospital emergency department.  °If you develop a fever greater that 101 F,  purulent drainage from wound, increased redness or drainage from wound, foul odor from the wound/dressing, or calf pain - CONTACT YOUR SURGEON.   °                                                °FOLLOW-UP APPOINTMENTS °Make sure you keep all of your appointments after your operation with your surgeon and caregivers. You should call the office at the above phone number and make an appointment for approximately two weeks after the date of your surgery or on the date instructed by your surgeon outlined in the "After Visit Summary". ° °RANGE OF MOTION AND STRENGTHENING EXERCISES  °These exercises are designed to help you keep full movement of your hip joint. Follow your caregiver's or physical therapist's instructions. Perform all exercises about fifteen times, three times per day or as directed. Exercise both hips, even if you   have had only one joint replacement. These exercises can be done on a training (exercise) mat, on the floor, on a table or on a bed. Use whatever works the best and is most comfortable for you. Use music or television while you are exercising so that the exercises are a pleasant break in your day. This will make your life better with the exercises acting as a break in routine you can look forward to.  Lying on your back, slowly slide your foot toward your buttocks, raising your knee up off the floor. Then slowly slide your foot back down until your leg is straight again.  Lying on your back spread your legs as far apart as you can without causing discomfort.  Lying on your side, raise your upper leg and foot straight up from the floor as far as is comfortable. Slowly lower the leg and repeat.  Lying on your back, tighten up the muscle in the front of your thigh (quadriceps muscles). You can do this by keeping your leg straight and trying to raise your heel off the floor. This helps strengthen the largest muscle supporting your knee.  Lying on your back, tighten up the muscles of your  buttocks both with the legs straight and with the knee bent at a comfortable angle while keeping your heel on the floor.   IF YOU ARE TRANSFERRED TO A SKILLED REHAB FACILITY If the patient is transferred to a skilled rehab facility following release from the hospital, a list of the current medications will be sent to the facility for the patient to continue.  When discharged from the skilled rehab facility, please have the facility set up the patient's [place] prior to being released. Also, the skilled facility will be responsible for providing the patient with their medications at time of release from the facility to include their pain medication, the muscle relaxants, and their blood thinner medication. If the patient is still at the rehab facility at time of the two week follow up appointment, the skilled rehab facility will also need to assist the patient in arranging follow up appointment in our office and any transportation needs.  MAKE SURE YOU:  Understand these instructions.  Get help right away if you are not doing well or get worse.    Pick up stool softner and laxative for home use following surgery while on pain medications. Do not submerge incision under water. Please use good hand washing techniques while changing dressing each day. May shower starting three days after surgery. Please use a clean towel to pat the incision dry following showers. Continue to use ice for pain and swelling after surgery. Do not use any lotions or creams on the incision until instructed by your surgeon.  Take Xarelto for two and a half more weeks, then discontinue Xarelto. Once the patient has completed the Xarelto, they may resume the 325 mg Aspirin.   Information on my medicine - XARELTO (Rivaroxaban)  This medication education was reviewed with me or my healthcare representative as part of my discharge preparation.  The pharmacist that spoke with me during my hospital stay  was:  Hershal Coria, [facility]  Why was Xarelto prescribed for you? Xarelto was prescribed for you to reduce the risk of blood clots forming after orthopedic surgery. The medical term for these abnormal blood clots is venous thromboembolism (VTE).  What do you need to know about xarelto ? Take your Xarelto ONCE DAILY at the same time every day.  You may take it either with or without food.  If you have difficulty swallowing the tablet whole, you may crush it and mix in applesauce just prior to taking your dose.  Take Xarelto exactly as prescribed by your doctor and DO NOT stop taking Xarelto without talking to the doctor who prescribed the medication.  Stopping without other VTE prevention medication to take the place of Xarelto may increase your risk of developing a clot.  After discharge, you should have regular check-up appointments with your healthcare provider that is prescribing your Xarelto.    What do you do if you miss a dose? If you miss a dose, take it as soon as you remember on the same day then continue your regularly scheduled once daily regimen the next day. Do not take two doses of Xarelto on the same day.   Important Safety Information A possible side effect of Xarelto is bleeding. You should call your healthcare provider right away if you experience any of the following: ? Bleeding from an injury or your nose that does not stop. ? Unusual colored urine (red or dark brown) or unusual colored stools (red or black). ? Unusual bruising for unknown reasons. ? A serious fall or if you hit your head (even if there is no bleeding).  Some medicines may interact with Xarelto and might increase your risk of bleeding while on Xarelto. To help avoid this, consult your healthcare provider or pharmacist prior to using any new prescription or non-prescription medications, including herbals, vitamins, non-steroidal anti-inflammatory drugs (NSAIDs) and supplements.  This website has  more information on Xarelto: [email or web address]

## 2015-03-09 NOTE — Care Management Note (Signed)
Case Management Note  Patient Details  Name: TYLIAH DENHOLM MRN: [id number] Date of Birth: [birth year]  Subjective/Objective:  S/p Right total hip arthroplasty, anterior approach                  Action/Plan: Discharge planning, spoke with patient and spouse at beside. Chose [facility] for [facility], contacted [facility] for referral. Has RW and 3-n-1 at home.  Expected Discharge Date:  03/10/15               Expected Discharge Plan:  [place]  In-House Referral:  NA  Discharge planning Services  CM Consult  Post Acute Care Choice:  Home Health Choice offered to:  Patient  DME Arranged:  N/A DME Agency:  NA  HH Arranged:  PT [place] Agency:  [place]  Status of Service:  Completed, signed off  Medicare Important Message Given:    Date Medicare IM Given:    Medicare IM give by:    Date Additional Medicare IM Given:    Additional Medicare Important Message give by:     If discussed at [place] of Stay Meetings, dates discussed:    Additional Comments:  Guadalupe Maple, RN 03/09/2015, 1:17 PM

## 2015-03-09 NOTE — Evaluation (Signed)
Occupational Therapy Evaluation Patient Details Name: Donna Moon MRN: [id number] DOB: Aug 11, 1950 Today's Date: 03/09/2015    History of Present Illness Donna Moon 12/21; h/o 2 hip surgeries and knee surgery   Clinical Impression   This 64 year old female was admitted for the above surgery.  All education was completed. No further OT is needed at this time.    Follow Up Recommendations  No OT follow up    Equipment Recommendations  None recommended by OT    Recommendations for Other Services       Precautions / Restrictions Precautions Precautions: Fall Restrictions Weight Bearing Restrictions: No      Mobility Bed Mobility            General bed mobility comments: oob  Transfers                 Balance                                            ADL Overall ADL's : Needs assistance/impaired         Upper Body Bathing: Set up;Sitting   Lower Body Bathing: Min guard;Sit to/from stand   Upper Body Dressing : Set up;Sitting   Lower Body Dressing: Minimal assistance;Sit to/from stand                 General ADL Comments: pt has AE kit and wanted to review this for ADLs. She had not used reacher for pants before.  Pt cannot lift RLE by herself; educated to cross LLE under ankle if needed. She also has assistance. Pt feels she will be fine with toilet and shower transfers:  did not want to practiced. Reviewed sequence     Vision     Perception     Praxis      Pertinent Vitals/Pain Pain Assessment: 0-10 Pain Score: 2  Pain Location: Donna thigh Pain Descriptors / Indicators: Aching Pain Intervention(s): Limited activity within patient's tolerance;Monitored during session;Premedicated before session;Repositioned;Ice applied     Hand Dominance Right   Extremity/Trunk Assessment Upper Extremity Assessment Upper Extremity Assessment: RUE deficits/detail RUE Deficits / Details: decreased shoulder ROM, approximately 80  degrees      Cervical / Trunk Assessment Cervical / Trunk Assessment: Normal   Communication Communication Communication: No difficulties   Cognition Arousal/Alertness: Awake/alert Behavior During Therapy: WFL for tasks assessed/performed Overall Cognitive Status: Within Functional Limits for tasks assessed                     General Comments       Exercises      Shoulder Instructions      Home Living Family/patient expects to be discharged to:: Private residence Living Arrangements: Spouse/significant other Available Help at Discharge: Family Type of Home: House Home Access: Stairs to enter Technical brewer of Steps: 3 Entrance Stairs-Rails: Left Home Layout: One level     Bathroom Shower/Tub: Occupational psychologist: Handicapped height     Home Equipment: Environmental consultant - 2 wheels;Bedside commode          Prior Functioning/Environment Level of Independence: Independent with assistive device(s)             OT Diagnosis: Acute pain   OT Problem List:     OT Treatment/Interventions:      OT Goals(Current goals can be found in  the care plan section) Acute Rehab OT Goals Patient Stated Goal: to go home tomorrow. OT Goal Formulation: All assessment and education complete, DC therapy  OT Frequency:     Barriers to D/C:            Co-evaluation              End of Session    Activity Tolerance: Patient tolerated treatment well Patient left: in chair;with call bell/phone within reach;with family/visitor present   Time: 1561-5379 OT Time Calculation (min): 10 min Charges:  OT General Charges $OT Visit: 1 Procedure OT Evaluation $Initial OT Evaluation Tier I: 1 Procedure G-Codes:    Javen Hinderliter 2015-04-08, 11:58 AM  Lesle Chris, OTR/L [phone] 2015/04/08

## 2015-03-09 NOTE — Progress Notes (Signed)
Physical Therapy Treatment Patient Details Name: Donna Moon MRN: [id number] DOB: 1951/03/15 Today's Date: 03/09/2015    History of Present Illness R DATHA 12/21    PT Comments    Patient is progressing very well. Practiced steps. Plans for  Dc tomorrow.  Follow Up Recommendations  Home health PT;Supervision/Assistance - 24 hour     Equipment Recommendations  None recommended by PT    Recommendations for Other Services       Precautions / Restrictions Precautions Precautions: Fall Restrictions Weight Bearing Restrictions: No    Mobility  Bed Mobility Overal bed mobility: Needs Assistance Bed Mobility: Sit to Supine     Supine to sit: Min guard;HOB elevated Sit to supine: Modified independent (Device/Increase time)   General bed mobility comments: oob  Transfers Overall transfer level: Needs assistance Equipment used: Rolling walker (2 wheeled) Transfers: Sit to/from Stand Sit to Stand: Supervision         General transfer comment: cues for  hand and r leg position.  Ambulation/Gait Ambulation/Gait assistance: Supervision Ambulation Distance (Feet): 300 Feet Assistive device: Rolling walker (2 wheeled) Gait Pattern/deviations: Step-through pattern     General Gait Details: cues for sequence, able to advance the r leg without any problems, reports placing   about 75% weight on the Right leg   Stairs Stairs: Yes Stairs assistance: Min guard Stair Management: Forwards;One rail Left;Step to pattern Number of Stairs: 6 General stair comments: cues for sequence, patient actually stepped up with surgery leg, reviewed proper sequence again  Wheelchair Mobility    Modified Rankin (Stroke Patients Only)       Balance                                    Cognition Arousal/Alertness: Awake/alert Behavior During Therapy: WFL for tasks assessed/performed Overall Cognitive Status: Within Functional Limits for tasks assessed                      Exercises Total Joint Exercises Ankle Circles/Pumps: AROM;Both;Supine;10 reps Short Arc Quad: AROM;Left;Supine;10 reps Heel Slides: AROM;Left;Supine;10 reps Hip ABduction/ADduction: AROM;Left;Supine;10 reps Long Arc Quad: AROM;Left;Supine;10 reps Other Exercises Other Exercises: towel assisted hip flexion x 5 in sitting on Right    General Comments        Pertinent Vitals/Pain Pain Assessment: 0-10 Pain Score: 4  Pain Location: R thigh Pain Descriptors / Indicators: Burning;Sore Pain Intervention(s): Premedicated before session;Repositioned;Ice applied    Home Living Family/patient expects to be discharged to:: Private residence Living Arrangements: Spouse/significant other Available Help at Discharge: Family Type of Home: House Home Access: Stairs to enter Entrance Stairs-Rails: Left Home Layout: One level Home Equipment: Environmental consultant - 2 wheels;Bedside commode      Prior Function Level of Independence: Independent with assistive device(s)          PT Goals (current goals can now be found in the care plan section) Acute Rehab PT Goals Patient Stated Goal: to go home tomorrow. PT Goal Formulation: With patient/family Time For Goal Achievement: 03/11/15 Potential to Achieve Goals: Good Progress towards PT goals: Progressing toward goals    Frequency  7X/week    PT Plan Current plan remains appropriate    Co-evaluation             End of Session   Activity Tolerance: Patient tolerated treatment well Patient left: in bed;with call bell/phone within reach;with family/visitor present     Time:  [id number] PT Time Calculation (min) (ACUTE ONLY): 33 min  Charges:  $Gait Training: 8-22 mins $Therapeutic Exercise: 8-22 mins                    G Codes:      Claretha Cooper 03/09/2015, 1:25 PM Tresa Endo PT [phone]

## 2015-03-09 NOTE — Progress Notes (Signed)
Utilization review completed.  

## 2015-03-09 NOTE — Evaluation (Signed)
Physical Therapy Evaluation Patient Details Name: Donna Moon MRN: [id number] DOB: Jan 21, 1951 Today's Date: 03/09/2015   History of Present Illness  R DATHA 12/21  Clinical Impression  Patient  Ambulated x 300' first  Session. Should progress well with plans for DC tomorrow. Patient will benefit from PT to address problems listed in note below to DC to home.     Follow Up Recommendations Home health PT;Supervision/Assistance - 24 hour    Equipment Recommendations  None recommended by PT    Recommendations for Other Services       Precautions / Restrictions Precautions Precautions: Fall Restrictions Weight Bearing Restrictions: No      Mobility  Bed Mobility Overal bed mobility: Needs Assistance Bed Mobility: Supine to Sit     Supine to sit: Min guard;HOB elevated     General bed mobility comments: able to slide the right leg to the edge  Transfers Overall transfer level: Needs assistance Equipment used: Rolling walker (2 wheeled) Transfers: Sit to/from Stand Sit to Stand: Min guard         General transfer comment: cues for  hand and r leg position.  Ambulation/Gait Ambulation/Gait assistance: Min guard Ambulation Distance (Feet): 300 Feet Assistive device: Rolling walker (2 wheeled) Gait Pattern/deviations: Step-through pattern     General Gait Details: cues for sequence, able to advance the r leg without any problems, reports placing   about 75% weight on the Right leg  Stairs            Wheelchair Mobility    Modified Rankin (Stroke Patients Only)       Balance                                             Pertinent Vitals/Pain Pain Assessment: 0-10 Pain Score: 7  Pain Location: rIGHT THIGH AND HIP AREA. Pain Descriptors / Indicators: Aching;Burning Pain Intervention(s): Monitored during session;Premedicated before session;Repositioned;Ice applied    Home Living Family/patient expects to be discharged to:: Private  residence Living Arrangements: Spouse/significant other Available Help at Discharge: Family Type of Home: House Home Access: Stairs to enter Entrance Stairs-Rails: Left Entrance Stairs-Number of Steps: 3 Home Layout: One level Home Equipment: [place] - 2 wheels;Bedside commode      Prior Function Level of Independence: Independent with assistive device(s)               Hand Dominance   Dominant Hand: Right    Extremity/Trunk Assessment   Upper Extremity Assessment: Defer to OT evaluation           Lower Extremity Assessment: RLE deficits/detail RLE Deficits / Details: able to flex hip in sitting, advances the leg without a problem    Cervical / Trunk Assessment: Normal  Communication   Communication: No difficulties  Cognition Arousal/Alertness: Awake/alert Behavior During Therapy: WFL for tasks assessed/performed Overall Cognitive Status: Within Functional Limits for tasks assessed                      General Comments      Exercises Total Joint Exercises Long Arc Quad: AROM;Both;10 reps;Seated Other Exercises Other Exercises: towel assisted hip flexion x 5 in sitting on Right      Assessment/Plan    PT Assessment Patient needs continued PT services  PT Diagnosis Difficulty walking;Acute pain   PT Problem List Decreased strength;Decreased range of motion;Decreased activity  tolerance;Decreased mobility;Decreased knowledge of precautions;Decreased safety awareness;Decreased knowledge of use of DME;Pain  PT Treatment Interventions DME instruction;Gait training;Stair training;Functional mobility training;Therapeutic activities;Therapeutic exercise;Patient/family education   PT Goals (Current goals can be found in the Care Plan section) Acute Rehab PT Goals Patient Stated Goal: to go home tomorrow. PT Goal Formulation: With patient/family Time For Goal Achievement: 03/11/15 Potential to Achieve Goals: Good    Frequency 7X/week   Barriers to  discharge        Co-evaluation               End of Session   Activity Tolerance: Patient tolerated treatment well Patient left: in chair;with call bell/phone within reach;with family/visitor present Nurse Communication: Mobility status         Time: 0842-0900 PT Time Calculation (min) (ACUTE ONLY): 18 min   Charges:   PT Evaluation $Initial PT Evaluation Tier I: 1 Procedure     PT G CodesClaretha Cooper 03/09/2015, 9:44 AM Tresa Endo PT [phone]

## 2015-03-09 NOTE — Progress Notes (Signed)
Pt independent uses front wheel walker to ambulate. R hip with dsg, marked area with small amt of bloody drainage some bruising  noted.  Ice pack to right hip area. Pt with c/o of pain rated 6/10, prn given per Doctor order. Call light and phone within reach.

## 2015-03-09 NOTE — Progress Notes (Signed)
Physical Therapy Treatment Patient Details Name: Donna Moon MRN: [id number] DOB: Sep 30, 1950 Today's Date: 03/09/2015    History of Present Illness R DATHA 12/21    PT Comments    Patient is progressing nicely. Plans Dc in AM.  Follow Up Recommendations  Home health PT;Supervision/Assistance - 24 hour     Equipment Recommendations  None recommended by PT    Recommendations for Other Services       Precautions / Restrictions      Mobility  Bed Mobility                  Transfers       Sit to Stand: Modified independent (Device/Increase time)            Ambulation/Gait Ambulation/Gait assistance: Supervision Ambulation Distance (Feet): 300 Feet Assistive device: Rolling walker (2 wheeled) Gait Pattern/deviations: Step-through pattern     General Gait Details: smoothe gait   Stairs Stairs: Yes Stairs assistance: Supervision Stair Management: One rail Left;Step to pattern;Forwards   General stair comments: cues for sequence, patient actually stepped up with surgery leg, reviewed proper sequence again  Wheelchair Mobility    Modified Rankin (Stroke Patients Only)       Balance                                    Cognition                            Exercises      General Comments        Pertinent Vitals/Pain Pain Score: 4  Pain Location: R hip Pain Descriptors / Indicators: Tender;Burning Pain Intervention(s): Repositioned;Patient requesting pain meds-RN notified;Ice applied    Home Living                      Prior Function            PT Goals (current goals can now be found in the care plan section) Progress towards PT goals: Progressing toward goals    Frequency  7X/week    PT Plan Current plan remains appropriate    Co-evaluation             End of Session   Activity Tolerance: Patient tolerated treatment well Patient left: in chair;with call bell/phone within reach      Time: [id number] PT Time Calculation (min) (ACUTE ONLY): 13 min  Charges:  $Gait Training: 8-22 mins                    G Codes:      Claretha Cooper 03/09/2015, 5:56 PM

## 2015-03-09 NOTE — Progress Notes (Signed)
   Subjective: 1 Day Post-Op Procedure(s) (LRB): TOTAL RIGHT  HIP ARTHROPLASTY ANTERIOR APPROACH (Right) Patient reports pain as mild.   Patient seen in rounds for Dr. Wynelle Link. Patient is well, and has had no acute complaints or problems We will start therapy today.  Plan is to go Home after hospital stay.  Objective: Vital signs in last 24 hours: Temp:  [97.4 F (36.3 C)-99 F (37.2 C)] 98.7 F (37.1 C) (12/22 0501) Pulse Rate:  [48-73] 64 (12/22 0501) Resp:  [12-16] 16 (12/22 0501) BP: (111-166)/(51-77) 111/55 mmHg (12/22 0501) SpO2:  [96 %-100 %] 96 % (12/22 0501) Weight:  [105.235 kg (232 lb)] 105.235 kg (232 lb) (12/21 1208)  Intake/Output from previous day:  Intake/Output Summary (Last 24 hours) at 03/09/15 0726 Last data filed at 03/09/15 0500  Gross per 24 hour  Intake 2632.5 ml  Output   3350 ml  Net -717.5 ml     Labs:  Recent Labs  03/09/15 0405  HGB 12.1    Recent Labs  03/09/15 0405  WBC 10.9*  RBC 3.87  HCT 35.7*  PLT 182    Recent Labs  03/09/15 0405  NA 140  K 4.4  CL 106  CO2 26  BUN 15  CREATININE 0.97  GLUCOSE 141*  CALCIUM 9.2   No results for input(s): LABPT, INR in the last 72 hours.  EXAM General - Patient is Alert, Appropriate and Oriented Extremity - Neurovascular intact Sensation intact distally Dorsiflexion/Plantar flexion intact Dressing - dressing C/D/I Motor Function - intact, moving foot and toes well on exam.  Hemovac pulled without difficulty.  Past Medical History  Diagnosis Date  . Atrial fibrillation ([place])   . Dizziness   . Obesity   . Asthma   . Hx of seasonal allergies     tx. Allegra, Flonase  . Blood transfusion without reported diagnosis     as a child--age 37  . Dyspareunia   . Osteoporosis   . Arthritis   . PONV (postoperative nausea and vomiting)     however no problems with past surgeries here.  . Pneumonia     hx of   . Hematuria     hx of has been worked up and no problems   .  Cancer [facility])     Mohs surgery for skin cancer on right side of nose     Assessment/Plan: 1 Day Post-Op Procedure(s) (LRB): TOTAL RIGHT  HIP ARTHROPLASTY ANTERIOR APPROACH (Right) Principal Problem:   OA (osteoarthritis) of hip  Estimated body mass index is 35.28 kg/(m^2) as calculated from the following:   Height as of this encounter: 5\' 8"  (1.727 m).   Weight as of this encounter: 105.235 kg (232 lb). Advance diet Up with therapy Plan for discharge tomorrow Discharge home with home health  DVT Prophylaxis - Xarelto Weight Bearing As Tolerated right Leg Hemovac Pulled Begin Therapy  Arlee Muslim, PA-C Orthopaedic Surgery 03/09/2015, 7:26 AM

## 2015-03-10 LAB — BASIC METABOLIC PANEL
Anion gap: 7 (ref 5–15)
BUN: 19 mg/dL (ref 6–20)
CALCIUM: 9.3 mg/dL (ref 8.9–10.3)
CO2: 27 mmol/L (ref 22–32)
CREATININE: 0.97 mg/dL (ref 0.44–1.00)
Chloride: 107 mmol/L (ref 101–111)
GFR calc Af Amer: >60 mL/min (ref >60)
GLUCOSE: 123 mg/dL — AB (ref 65–99)
Potassium: 4 mmol/L (ref 3.5–5.1)
Sodium: 141 mmol/L (ref 135–145)

## 2015-03-10 LAB — CBC
HCT: 34.2 % — ABNORMAL LOW (ref 36.0–46.0)
Hemoglobin: 11.5 g/dL — ABNORMAL LOW (ref 12.0–15.0)
MCH: 31.3 pg (ref 26.0–34.0)
MCHC: 33.6 g/dL (ref 30.0–36.0)
MCV: 93.2 fL (ref 78.0–100.0)
PLATELETS: 178 10*3/uL (ref 150–400)
RBC: 3.67 MIL/uL — ABNORMAL LOW (ref 3.87–5.11)
RDW: 13.3 % (ref 11.5–15.5)
WBC: 11.8 10*3/uL — ABNORMAL HIGH (ref 4.0–10.5)

## 2015-03-10 MED ORDER — TRAMADOL HCL 50 MG PO TABS: 50.0000 mg | ORAL_TABLET | Freq: Four times a day (QID) | ORAL | Status: DC | PRN

## 2015-03-10 MED ORDER — RIVAROXABAN 10 MG PO TABS: 10.0000 mg | ORAL_TABLET | Freq: Every day | ORAL | Status: DC

## 2015-03-10 MED ORDER — METHOCARBAMOL 500 MG PO TABS: 500.0000 mg | ORAL_TABLET | Freq: Four times a day (QID) | ORAL | Status: DC | PRN

## 2015-03-10 MED ORDER — OXYCODONE HCL 5 MG PO TABS: 5.0000 mg | ORAL_TABLET | ORAL | Status: DC | PRN

## 2015-03-10 NOTE — Progress Notes (Signed)
   Subjective: 2 Days Post-Op Procedure(s) (LRB): TOTAL RIGHT  HIP ARTHROPLASTY ANTERIOR APPROACH (Right) Patient reports pain as mild.   Patient seen in rounds with Dr. Wynelle Link.  Husband in room at bedside. She is doing better today. Patient is well, and has had no acute complaints or problems Patient is ready to go home.  Objective: Vital signs in last 24 hours: Temp:  [98.5 F (36.9 C)-98.9 F (37.2 C)] 98.9 F (37.2 C) (12/23 0519) Pulse Rate:  [62-70] 68 (12/23 0519) Resp:  [18] 18 (12/23 0519) BP: (121-148)/(59-71) 129/61 mmHg (12/23 0519) SpO2:  [92 %-98 %] 96 % (12/23 0519)  Intake/Output from previous day:  Intake/Output Summary (Last 24 hours) at 03/10/15 0846 Last data filed at 03/10/15 0318  Gross per 24 hour  Intake 1347.5 ml  Output   2750 ml  Net -1402.5 ml    Labs:  Recent Labs  03/09/15 0405 03/10/15 0507  HGB 12.1 11.5*    Recent Labs  03/09/15 0405 03/10/15 0507  WBC 10.9* 11.8*  RBC 3.87 3.67*  HCT 35.7* 34.2*  PLT 182 178    Recent Labs  03/09/15 0405 03/10/15 0507  NA 140 141  K 4.4 4.0  CL 106 107  CO2 26 27  BUN 15 19  CREATININE 0.97 0.97  GLUCOSE 141* 123*  CALCIUM 9.2 9.3   No results for input(s): LABPT, INR in the last 72 hours.  EXAM: General - Patient is Alert, Appropriate and Oriented Extremity - Neurovascular intact Sensation intact distally Dorsiflexion/Plantar flexion intact Incision - clean, dry, no drainage Motor Function - intact, moving foot and toes well on exam.   Assessment/Plan: 2 Days Post-Op Procedure(s) (LRB): TOTAL RIGHT  HIP ARTHROPLASTY ANTERIOR APPROACH (Right) Procedure(s) (LRB): TOTAL RIGHT  HIP ARTHROPLASTY ANTERIOR APPROACH (Right) Past Medical History  Diagnosis Date  . Atrial fibrillation ([place])   . Dizziness   . Obesity   . Asthma   . Hx of seasonal allergies     tx. Allegra, Flonase  . Blood transfusion without reported diagnosis     as a child--age 67  . Dyspareunia   .  Osteoporosis   . Arthritis   . PONV (postoperative nausea and vomiting)     however no problems with past surgeries here.  . Pneumonia     hx of   . Hematuria     hx of has been worked up and no problems   . Cancer [facility])     Mohs surgery for skin cancer on right side of nose    Principal Problem:   OA (osteoarthritis) of hip  Estimated body mass index is 35.28 kg/(m^2) as calculated from the following:   Height as of this encounter: 5\' 8"  (1.727 m).   Weight as of this encounter: 105.235 kg (232 lb). Advance diet Up with therapy Discharge home with home health Diet - Cardiac diet Follow up - in 2 weeks Activity - WBAT Disposition - Home Condition Upon Discharge - Good D/C Meds - See DC Summary DVT Prophylaxis - Xarelto  Arlee Muslim, PA-C Orthopaedic Surgery 03/10/2015, 8:46 AM

## 2015-03-10 NOTE — Discharge Summary (Signed)
Physician Discharge Summary   Patient ID: Donna Moon MRN: [id number] DOB/AGE: November 26, 1950 64 y.o.  Admit date: 03/08/2015 Discharge date: 03-10-2015  Primary Diagnosis:  Osteoarthritis of the Right hip.   Admission Diagnoses:  Past Medical History  Diagnosis Date  . Atrial fibrillation ([place])   . Dizziness   . Obesity   . Asthma   . Hx of seasonal allergies     tx. Allegra, Flonase  . Blood transfusion without reported diagnosis     as a child--age 89  . Dyspareunia   . Osteoporosis   . Arthritis   . PONV (postoperative nausea and vomiting)     however no problems with past surgeries here.  . Pneumonia     hx of   . Hematuria     hx of has been worked up and no problems   . Cancer [facility])     Mohs surgery for skin cancer on right side of nose    Discharge Diagnoses:   Principal Problem:   OA (osteoarthritis) of hip  Estimated body mass index is 35.28 kg/(m^2) as calculated from the following:   Height as of this encounter: '5\' 8"'  (1.727 m).   Weight as of this encounter: 105.235 kg (232 lb).  Procedure(s) (LRB): TOTAL RIGHT  HIP ARTHROPLASTY ANTERIOR APPROACH (Right)   Consults: None  HPI: Donna Moon is a 64 y.o. female who has advanced end-  stage arthritis of her Right hip with progressively worsening pain and  dysfunction.The patient has failed nonoperative management and presents for  total hip arthroplasty.   Laboratory Data: Admission on 03/08/2015  Component Date Value Ref Range Status  . WBC 03/09/2015 10.9* 4.0 - 10.5 K/uL Final  . RBC 03/09/2015 3.87  3.87 - 5.11 MIL/uL Final  . Hemoglobin 03/09/2015 12.1  12.0 - 15.0 g/dL Final  . HCT 03/09/2015 35.7* 36.0 - 46.0 % Final  . MCV 03/09/2015 92.2  78.0 - 100.0 fL Final  . MCH 03/09/2015 31.3  26.0 - 34.0 pg Final  . MCHC 03/09/2015 33.9  30.0 - 36.0 g/dL Final  . RDW 03/09/2015 13.0  11.5 - 15.5 % Final  . Platelets 03/09/2015 182  150 - 400 K/uL Final  . Sodium 03/09/2015 140  135 - 145 mmol/L  Final  . Potassium 03/09/2015 4.4  3.5 - 5.1 mmol/L Final  . Chloride 03/09/2015 106  101 - 111 mmol/L Final  . CO2 03/09/2015 26  22 - 32 mmol/L Final  . Glucose, Bld 03/09/2015 141* 65 - 99 mg/dL Final  . BUN 03/09/2015 15  6 - 20 mg/dL Final  . Creatinine, Ser 03/09/2015 0.97  0.44 - 1.00 mg/dL Final  . Calcium 03/09/2015 9.2  8.9 - 10.3 mg/dL Final  . GFR calc non Af Amer 03/09/2015 >60  >60 mL/min Final  . GFR calc Af Amer 03/09/2015 >60  >60 mL/min Final   Comment: (NOTE) The eGFR has been calculated using the CKD EPI equation. This calculation has not been validated in all clinical situations. eGFR's persistently <60 mL/min signify possible Chronic Kidney Disease.   . Anion gap 03/09/2015 8  5 - 15 Final  . WBC 03/10/2015 11.8* 4.0 - 10.5 K/uL Final  . RBC 03/10/2015 3.67* 3.87 - 5.11 MIL/uL Final  . Hemoglobin 03/10/2015 11.5* 12.0 - 15.0 g/dL Final  . HCT 03/10/2015 34.2* 36.0 - 46.0 % Final  . MCV 03/10/2015 93.2  78.0 - 100.0 fL Final  . MCH 03/10/2015 31.3  26.0 - 34.0  pg Final  . MCHC 03/10/2015 33.6  30.0 - 36.0 g/dL Final  . RDW 03/10/2015 13.3  11.5 - 15.5 % Final  . Platelets 03/10/2015 178  150 - 400 K/uL Final  . Sodium 03/10/2015 141  135 - 145 mmol/L Final  . Potassium 03/10/2015 4.0  3.5 - 5.1 mmol/L Final  . Chloride 03/10/2015 107  101 - 111 mmol/L Final  . CO2 03/10/2015 27  22 - 32 mmol/L Final  . Glucose, Bld 03/10/2015 123* 65 - 99 mg/dL Final  . BUN 03/10/2015 19  6 - 20 mg/dL Final  . Creatinine, Ser 03/10/2015 0.97  0.44 - 1.00 mg/dL Final  . Calcium 03/10/2015 9.3  8.9 - 10.3 mg/dL Final  . GFR calc non Af Amer 03/10/2015 >60  >60 mL/min Final  . GFR calc Af Amer 03/10/2015 >60  >60 mL/min Final   Comment: (NOTE) The eGFR has been calculated using the CKD EPI equation. This calculation has not been validated in all clinical situations. eGFR's persistently <60 mL/min signify possible Chronic Kidney Disease.   Georgiann Hahn gap 03/10/2015 7  5 - 15  Final  Hospital Outpatient Visit on 03/01/2015  Component Date Value Ref Range Status  . aPTT 03/01/2015 29  24 - 37 seconds Final  . WBC 03/01/2015 5.7  4.0 - 10.5 K/uL Final  . RBC 03/01/2015 4.42  3.87 - 5.11 MIL/uL Final  . Hemoglobin 03/01/2015 13.7  12.0 - 15.0 g/dL Final  . HCT 03/01/2015 41.7  36.0 - 46.0 % Final  . MCV 03/01/2015 94.3  78.0 - 100.0 fL Final  . MCH 03/01/2015 31.0  26.0 - 34.0 pg Final  . MCHC 03/01/2015 32.9  30.0 - 36.0 g/dL Final  . RDW 03/01/2015 13.0  11.5 - 15.5 % Final  . Platelets 03/01/2015 225  150 - 400 K/uL Final  . Sodium 03/01/2015 136  135 - 145 mmol/L Final  . Potassium 03/01/2015 4.5  3.5 - 5.1 mmol/L Final  . Chloride 03/01/2015 105  101 - 111 mmol/L Final  . CO2 03/01/2015 26  22 - 32 mmol/L Final  . Glucose, Bld 03/01/2015 101* 65 - 99 mg/dL Final  . BUN 03/01/2015 22* 6 - 20 mg/dL Final  . Creatinine, Ser 03/01/2015 1.15* 0.44 - 1.00 mg/dL Final  . Calcium 03/01/2015 9.3  8.9 - 10.3 mg/dL Final  . Total Protein 03/01/2015 7.0  6.5 - 8.1 g/dL Final  . Albumin 03/01/2015 4.4  3.5 - 5.0 g/dL Final  . AST 03/01/2015 19  15 - 41 U/L Final  . ALT 03/01/2015 17  14 - 54 U/L Final  . Alkaline Phosphatase 03/01/2015 60  38 - 126 U/L Final  . Total Bilirubin 03/01/2015 0.6  0.3 - 1.2 mg/dL Final  . GFR calc non Af Amer 03/01/2015 49* >60 mL/min Final  . GFR calc Af Amer 03/01/2015 57* >60 mL/min Final   Comment: (NOTE) The eGFR has been calculated using the CKD EPI equation. This calculation has not been validated in all clinical situations. eGFR's persistently <60 mL/min signify possible Chronic Kidney Disease.   . Anion gap 03/01/2015 5  5 - 15 Final  . Prothrombin Time 03/01/2015 12.9  11.6 - 15.2 seconds Final  . INR 03/01/2015 0.95  0.00 - 1.49 Final  . ABO/RH(D) 03/01/2015 A POS   Final  . Antibody Screen 03/01/2015 NEG   Final  . Sample Expiration 03/01/2015 03/11/2015   Final  . Extend sample reason 03/01/2015 NO TRANSFUSIONS OR  PREGNANCY IN THE PAST 3 MONTHS   Final  . Color, Urine 03/01/2015 YELLOW  YELLOW Final  . APPearance 03/01/2015 CLEAR  CLEAR Final  . Specific Gravity, Urine 03/01/2015 1.013  1.005 - 1.030 Final  . pH 03/01/2015 5.5  5.0 - 8.0 Final  . Glucose, UA 03/01/2015 NEGATIVE  NEGATIVE mg/dL Final  . Hgb urine dipstick 03/01/2015 SMALL* NEGATIVE Final  . Bilirubin Urine 03/01/2015 NEGATIVE  NEGATIVE Final  . Ketones, ur 03/01/2015 NEGATIVE  NEGATIVE mg/dL Final  . Protein, ur 03/01/2015 NEGATIVE  NEGATIVE mg/dL Final  . Nitrite 03/01/2015 NEGATIVE  NEGATIVE Final  . Leukocytes, UA 03/01/2015 NEGATIVE  NEGATIVE Final  . MRSA, PCR 03/01/2015 NEGATIVE  NEGATIVE Final  . Staphylococcus aureus 03/01/2015 NEGATIVE  NEGATIVE Final   Comment:        The Xpert SA Assay (FDA approved for NASAL specimens in patients over 40 years of age), is one component of a comprehensive surveillance program.  Test performance has been validated by [facility] for patients greater than or equal to 19 year old. It is not intended to diagnose infection nor to guide or monitor treatment.   . Squamous Epithelial / LPF 03/01/2015 0-5* NONE SEEN Final  . WBC, UA 03/01/2015 0-5  0 - 5 WBC/hpf Final  . RBC / HPF 03/01/2015 0-5  0 - 5 RBC/hpf Final  . Bacteria, UA 03/01/2015 RARE* NONE SEEN Final  . Urine-Other 03/01/2015 MUCOUS PRESENT   Final     X-Rays:Dg Pelvis Portable  03/08/2015  CLINICAL DATA:  Postop right hip surgery. EXAM: PORTABLE PELVIS 1-2 VIEWS COMPARISON:  06/06/2010 FINDINGS: There is a new right hip arthroplasty. Surgical drain is present. Expected lucency in the soft tissues. Again noted is the left hip replacement. Visualized pelvic bony ring is intact. IMPRESSION: Placement of right hip arthroplasty. Expected postoperative changes. Electronically Signed   By: Markus Daft M.D.   On: 03/08/2015 16:17   Dg C-arm 1-60 Min-no Report  03/08/2015  CLINICAL DATA: hip C-ARM 1-60 MINUTES Fluoroscopy was  utilized by the requesting physician.  No radiographic interpretation.    EKG: Orders placed or performed in visit on 08/04/14  . EKG 12-Lead     Hospital Course: Patient was admitted to [facility] [place] and taken to the OR and underwent the above state procedure without complications.  Patient tolerated the procedure well and was later transferred to the recovery room and then to the orthopaedic floor for postoperative care.  They were given PO and IV analgesics for pain control following their surgery.  They were given 24 hours of postoperative antibiotics of  Anti-infectives    Start     Dose/Rate Route Frequency Ordered Stop   03/08/15 2000  ceFAZolin (ANCEF) IVPB 2 g/50 mL premix     2 g 100 mL/hr over 30 Minutes Intravenous Every 6 hours 03/08/15 1731 03/09/15 0224   03/08/15 1143  ceFAZolin (ANCEF) IVPB 2 g/50 mL premix     2 g 100 mL/hr over 30 Minutes Intravenous On call to O.R. 03/08/15 1143 03/08/15 1358     and started on DVT prophylaxis in the form of Xarelto.   PT and OT were ordered for total hip protocol.  The patient was allowed to be WBAT with therapy. Discharge planning was consulted to help with postop disposition and equipment needs.  Patient had a decent night on the evening of surgery.  They started to get up OOB with therapy on day one.  Hemovac drain was  pulled without difficulty.  Continued to work with therapy into day two.  Dressing was changed on day two and the incision was healing well. Patient was seen in rounds and was ready to go home on POD 2.  Discharge home with home health Diet - Cardiac diet Follow up - in 2 weeks Activity - WBAT Disposition - Home Condition Upon Discharge - Good D/C Meds - See DC Summary DVT Prophylaxis - Xarelto  Discharge Instructions    Call MD / Call 911    Complete by:  As directed   If you experience chest pain or shortness of breath, CALL 911 and be transported to the hospital emergency room.  If you develope a fever  above 101 F, pus (white drainage) or increased drainage or redness at the wound, or calf pain, call your surgeon's office.     Change dressing    Complete by:  As directed   You may change your dressing dressing daily with sterile 4 x 4 inch gauze dressing and paper tape.  Do not submerge the incision under water.     Constipation Prevention    Complete by:  As directed   Drink plenty of fluids.  Prune juice may be helpful.  You may use a stool softener, such as Colace (over the counter) 100 mg twice a day.  Use MiraLax (over the counter) for constipation as needed.     Diet - low sodium heart healthy    Complete by:  As directed      Discharge instructions    Complete by:  As directed   Pick up stool softner and laxative for home use following surgery while on pain medications. Do not submerge incision under water. Please use good hand washing techniques while changing dressing each day. May shower starting three days after surgery. Please use a clean towel to pat the incision dry following showers. Continue to use ice for pain and swelling after surgery. Do not use any lotions or creams on the incision until instructed by your surgeon.  Total Hip Protocol.  Take Xarelto for two and a half more weeks, then discontinue Xarelto. Once the patient has completed the Xarelto, they may resume the 325 mg Aspirin.  Postoperative Constipation Protocol  Constipation - defined medically as fewer than three stools per week and severe constipation as less than one stool per week.  One of the most common issues patients have following surgery is constipation.  Even if you have a regular bowel pattern at home, your normal regimen is likely to be disrupted due to multiple reasons following surgery.  Combination of anesthesia, postoperative narcotics, change in appetite and fluid intake all can affect your bowels.  In order to avoid complications following surgery, here are some recommendations in order  to help you during your recovery period.  Colace (docusate) - Pick up an over-the-counter form of Colace or another stool softener and take twice a day as long as you are requiring postoperative pain medications.  Take with a full glass of water daily.  If you experience loose stools or diarrhea, hold the colace until you stool forms back up.  If your symptoms do not get better within 1 week or if they get worse, check with your doctor.  Dulcolax (bisacodyl) - Pick up over-the-counter and take as directed by the product packaging as needed to assist with the movement of your bowels.  Take with a full glass of water.  Use this product as needed if  not relieved by Colace only.   MiraLax (polyethylene glycol) - Pick up over-the-counter to have on hand.  MiraLax is a solution that will increase the amount of water in your bowels to assist with bowel movements.  Take as directed and can mix with a glass of water, juice, soda, coffee, or tea.  Take if you go more than two days without a movement. Do not use MiraLax more than once per day. Call your doctor if you are still constipated or irregular after using this medication for 7 days in a row.  If you continue to have problems with postoperative constipation, please contact the office for further assistance and recommendations.  If you experience "the worst abdominal pain ever" or develop nausea or vomiting, please contact the office immediatly for further recommendations for treatment.     Do not sit on low chairs, stoools or toilet seats, as it may be difficult to get up from low surfaces    Complete by:  As directed      Driving restrictions    Complete by:  As directed   No driving until released by the physician.     Increase activity slowly as tolerated    Complete by:  As directed      Lifting restrictions    Complete by:  As directed   No lifting until released by the physician.     Patient may shower    Complete by:  As directed   You may  shower without a dressing once there is no drainage.  Do not wash over the wound.  If drainage remains, do not shower until drainage stops.     TED hose    Complete by:  As directed   Use stockings (TED hose) for 3 weeks on both leg(s).  You may remove them at night for sleeping.     Weight bearing as tolerated    Complete by:  As directed   Laterality:  right  Extremity:  Lower            Medication List    STOP taking these medications        aspirin 325 MG tablet     CALCIUM 600 + D PO     ibuprofen 800 MG tablet  Commonly known as:  ADVIL,MOTRIN     PROBIOTIC DAILY PO      TAKE these medications        albuterol 108 (90 BASE) MCG/ACT inhaler  Commonly known as:  PROVENTIL HFA;VENTOLIN HFA  Inhale 2 puffs into the lungs every 6 (six) hours as needed for wheezing.     fexofenadine 180 MG tablet  Commonly known as:  ALLEGRA  Take 180 mg by mouth daily.     fluticasone 50 MCG/ACT nasal spray  Commonly known as:  FLONASE  Place 2 sprays into both nostrils daily.     lansoprazole 30 MG capsule  Commonly known as:  PREVACID  Take 30 mg by mouth daily.     methocarbamol 500 MG tablet  Commonly known as:  ROBAXIN  Take 1 tablet (500 mg total) by mouth every 6 (six) hours as needed for muscle spasms.     metoprolol tartrate 25 MG tablet  Commonly known as:  LOPRESSOR  Take 1 tablet by mouth in morning, take 2 tablets by mouth in evening.     oxyCODONE 5 MG immediate release tablet  Commonly known as:  Oxy IR/ROXICODONE  Take 1-2 tablets (5-10 mg total) by  mouth every 3 (three) hours as needed for moderate pain or severe pain.     propafenone 150 MG tablet  Commonly known as:  RYTHMOL  TAKE  (1)  TABLET  EVERY EIGHT HOURS.     rivaroxaban 10 MG Tabs tablet  Commonly known as:  XARELTO  Take 1 tablet (10 mg total) by mouth daily with breakfast. Take Xarelto for two and a half more weeks, then discontinue Xarelto. Once the patient has completed the Xarelto, they  may resume the 325 mg Aspirin.     traMADol 50 MG tablet  Commonly known as:  ULTRAM  Take 1-2 tablets (50-100 mg total) by mouth every 6 (six) hours as needed (mild pain).           Follow-up Information    Follow up with [facility].   Why:  Physical therapy   Contact information:   [address] [place] [address] [phone]       Follow up with Gearlean Alf, MD. Schedule an appointment as soon as possible for a visit on 03/23/2015.   Specialty:  Orthopedic Surgery   Why:  Call office at [phone] to setup appointment on Thursday 03/23/2015 with Dr. Wynelle Link.   Contact information:   [address] [place] [address] [phone]       Signed: Arlee Muslim, PA-C Orthopaedic Surgery 03/10/2015, 9:07 AM

## 2015-03-10 NOTE — Progress Notes (Signed)
Physical Therapy Treatment Patient Details Name: Donna Moon MRN: [id number] DOB: 1950-10-01 Today's Date: 2015/03/27    History of Present Illness R DATHA 12/21    PT Comments    Pt progressing well with mobility.  Reviewed stairs, therex, and car transfers.  Follow Up Recommendations  Home health PT;Supervision/Assistance - 24 hour     Equipment Recommendations  None recommended by PT    Recommendations for Other Services       Precautions / Restrictions Precautions Precautions: Fall Restrictions Weight Bearing Restrictions: No    Mobility  Bed Mobility               General bed mobility comments: Pt reports no difficulty with bed mobility  Transfers Overall transfer level: Modified independent Equipment used: Rolling walker (2 wheeled);4-wheeled walker Transfers: Sit to/from Stand Sit to Stand: Modified independent (Device/Increase time)         General transfer comment: cues for  hand and r leg position.  Ambulation/Gait Ambulation/Gait assistance: Supervision Ambulation Distance (Feet): 400 Feet Assistive device: Rolling walker (2 wheeled) Gait Pattern/deviations: Step-through pattern;Decreased step length - right;Decreased step length - left;Shuffle;Trunk flexed     General Gait Details: min cues for posture and position from RW   Stairs Stairs: Yes Stairs assistance: Supervision Stair Management: One rail Left;Step to pattern;Forwards;With cane Number of Stairs: 4 General stair comments: min cues for sequence with cane  Wheelchair Mobility    Modified Rankin (Stroke Patients Only)       Balance                                    Cognition Arousal/Alertness: Awake/alert Behavior During Therapy: WFL for tasks assessed/performed Overall Cognitive Status: Within Functional Limits for tasks assessed                      Exercises Total Joint Exercises Ankle Circles/Pumps: AROM;Both;Supine;10 reps Quad Sets:  AROM;Both;15 reps;Supine Gluteal Sets: AROM;Both;10 reps;Supine Heel Slides: AROM;Left;Supine;20 reps Hip ABduction/ADduction: AROM;Left;Supine;20 reps Long Arc Quad: AROM;Left;Supine;10 reps    General Comments        Pertinent Vitals/Pain Pain Assessment: 0-10 Pain Score: 3  Pain Location: R hip/thigh Pain Descriptors / Indicators: Aching;Burning Pain Intervention(s): Limited activity within patient's tolerance;Monitored during session;Premedicated before session;Ice applied    Home Living                      Prior Function            PT Goals (current goals can now be found in the care plan section) Acute Rehab PT Goals Patient Stated Goal: to go home tomorrow. PT Goal Formulation: With patient/family Time For Goal Achievement: 03/11/15 Potential to Achieve Goals: Good Progress towards PT goals: Progressing toward goals    Frequency  7X/week    PT Plan Current plan remains appropriate    Co-evaluation             End of Session Equipment Utilized During Treatment: Gait belt Activity Tolerance: Patient tolerated treatment well Patient left: in chair;with call bell/phone within reach;with family/visitor present     Time: 0820-0850 PT Time Calculation (min) (ACUTE ONLY): 30 min  Charges:  $Gait Training: 8-22 mins $Therapeutic Exercise: 8-22 mins                    G Codes:      Topacio Cella 2015/03/27,  12:30 PM

## 2015-04-19 ENCOUNTER — Encounter: Payer: Self-pay | Admitting: Cardiovascular Disease

## 2015-04-19 ENCOUNTER — Ambulatory Visit (INDEPENDENT_AMBULATORY_CARE_PROVIDER_SITE_OTHER): Payer: Medicare Other | Admitting: Cardiovascular Disease

## 2015-04-19 VITALS — BP 110/84 | HR 58 | Ht 68.0 in | Wt 235.0 lb

## 2015-04-19 DIAGNOSIS — I480 Paroxysmal atrial fibrillation: Secondary | ICD-10-CM | POA: Diagnosis not present

## 2015-04-19 DIAGNOSIS — E669 Obesity, unspecified: Secondary | ICD-10-CM | POA: Diagnosis not present

## 2015-04-19 MED ORDER — PROPAFENONE HCL 150 MG PO TABS: ORAL_TABLET | ORAL | Status: DC

## 2015-04-19 MED ORDER — METOPROLOL TARTRATE 25 MG PO TABS: ORAL_TABLET | ORAL | Status: DC

## 2015-04-19 NOTE — Patient Instructions (Signed)
Dr. Croitoru recommends that you schedule a follow-up appointment in: ONE YEAR   

## 2015-04-19 NOTE — Progress Notes (Signed)
Patient ID: Donna Moon, female   DOB: November 16, 1950, 66 y.o.   MRN: [id number]    Cardiology Office Note    Date:  04/20/2015   ID:  Donna Moon, DOB 1950/09/20, MRN [id number]  PCP:  Antionette Fairy, PA-C  Cardiologist:   Sanda Klein, MD   Chief Complaint  Patient presents with  . Follow-up    no chest pain, no shortness of breath, some swelling, no cramping, no dizziness or lightheadedness    History of Present Illness:  Donna Moon is a 65 y.o. female with a history of paroxysmal atrial fibrillation with 2 previous successful ablation procedures and some residual palpitations presumed to be secondary to recurrent paroxysmal atrial arrhythmia. Symptoms are very well controlled on metoprolol and propafenone. Rarely has palpitations. She recently had right total hip replacement and 5 weeks later is walking with a cane and feels greatly improved. She has started going to the [facility] for exercise. She denies chest pain, edema, syncope, dizziness, focal neurological events. She does not have a history of stroke/TIA and has low embolic risk profile (CHADSvas 0-1 for female gender only).    Past Medical History  Diagnosis Date  . Atrial fibrillation ([place])   . Dizziness   . Obesity   . Asthma   . Hx of seasonal allergies     tx. Allegra, Flonase  . Blood transfusion without reported diagnosis     as a child--age 49  . Dyspareunia   . Osteoporosis   . Arthritis   . PONV (postoperative nausea and vomiting)     however no problems with past surgeries here.  . Pneumonia     hx of   . Hematuria     hx of has been worked up and no problems   . Cancer [facility])     Mohs surgery for skin cancer on right side of nose     Past Surgical History  Procedure Laterality Date  . Carpel tunnel Bilateral 1993  . Appendectomy  1968  . Oophorectomy  2009  . Cardiac electrophysiology study and ablation  1997 & 2011    x 2  . Cesarean section  1986  . Total hip arthroplasty  12/27/2010    x2  replacements, x1 repair.  . Rotator cuff repair Right 1999    x2 -'97, 99  . Total knee arthroplasty Left 12/07/2012    Procedure: LEFT TOTAL KNEE ARTHROPLASTY;  Surgeon: Gearlean Alf, MD;  Location: [facility];  Service: Orthopedics;  Laterality: Left;  . Cervical cone biopsy      laparoscopic BSO--endometrioma  . Colonoscopy  2009    Dr. Algis Greenhouse NL  . Upper gastrointestinal endoscopy  2009    Shiflett, NL  . Total hip arthroplasty Right 03/08/2015    Procedure: TOTAL RIGHT  HIP ARTHROPLASTY ANTERIOR APPROACH;  Surgeon: Gaynelle Arabian, MD;  Location: [facility];  Service: Orthopedics;  Laterality: Right;    Outpatient Prescriptions Prior to Visit  Medication Sig Dispense Refill  . albuterol (PROVENTIL HFA;VENTOLIN HFA) 108 (90 BASE) MCG/ACT inhaler Inhale 2 puffs into the lungs every 6 (six) hours as needed for wheezing.    . fexofenadine (ALLEGRA) 180 MG tablet Take 180 mg by mouth daily.    . fluticasone (FLONASE) 50 MCG/ACT nasal spray Place 2 sprays into both nostrils daily.     . lansoprazole (PREVACID) 30 MG capsule Take 30 mg by mouth daily.    . metoprolol tartrate (LOPRESSOR) 25 MG tablet Take 1 tablet by  mouth in morning, take 2 tablets by mouth in evening. (Patient taking differently: Take 25-50 mg by mouth 2 (two) times daily. Take 1 tablet by mouth in morning, take 2 tablets by mouth in evening.) 90 tablet 1  . propafenone (RYTHMOL) 150 MG tablet TAKE  (1)  TABLET  EVERY EIGHT HOURS. 90 tablet 6  . methocarbamol (ROBAXIN) 500 MG tablet Take 1 tablet (500 mg total) by mouth every 6 (six) hours as needed for muscle spasms. 80 tablet 0  . oxyCODONE (OXY IR/ROXICODONE) 5 MG immediate release tablet Take 1-2 tablets (5-10 mg total) by mouth every 3 (three) hours as needed for moderate pain or severe pain. 80 tablet 0  . rivaroxaban (XARELTO) 10 MG TABS tablet Take 1 tablet (10 mg total) by mouth daily with breakfast. Take Xarelto for two and a half more weeks, then discontinue  Xarelto. Once the patient has completed the Xarelto, they may resume the 325 mg Aspirin. 19 tablet 0  . traMADol (ULTRAM) 50 MG tablet Take 1-2 tablets (50-100 mg total) by mouth every 6 (six) hours as needed (mild pain). 80 tablet 1   No facility-administered medications prior to visit.     Allergies:   Lexapro and Xarelto   Social History   Social History  . Marital Status: Married    Spouse Name: N/A  . Number of Children: 1  . Years of Education: N/A   Occupational History  . retired    Social History Main Topics  . Smoking status: Former Smoker -- 0.50 packs/day for 9 years    Types: Cigarettes    Quit date: 03/18/1986  . Smokeless tobacco: Never Used  . Alcohol Use: No  . Drug Use: No  . Sexual Activity:    Partners: Male    Birth Control/ Protection: Post-menopausal   Other Topics Concern  . None   Social History Narrative     Family History:  The patient's family history includes Diabetes in her maternal grandmother, paternal grandmother, sister, and sister; Heart attack in her brother and father; Irritable bowel syndrome in her sister; Kidney disease in her mother; Ovarian cancer (age of onset: 30) in her mother; Stroke in her mother; Ulcerative colitis in her mother.   ROS:   Please see the history of present illness.    ROS All other systems reviewed and are negative.   PHYSICAL EXAM:   VS:  BP 110/84 mmHg  Pulse 58  Ht 5\' 8"  (1.727 m)  Wt 106.595 kg (235 lb)  BMI 35.74 kg/m2  LMP 03/18/2001   GEN: Well nourished, well developed, in no acute distress HEENT: normal Neck: no JVD, carotid bruits, or masses Cardiac: RRR; no murmurs, rubs, or gallops,no edema  Respiratory:  clear to auscultation bilaterally, normal work of breathing GI: soft, nontender, nondistended, + BS MS: no deformity or atrophy Skin: warm and dry, no rash Neuro:  Alert and Oriented x 3, Strength and sensation are intact Psych: euthymic mood, full affect  Wt Readings from Last  3 Encounters:  04/19/15 106.595 kg (235 lb)  03/08/15 105.235 kg (232 lb)  03/01/15 105.235 kg (232 lb)      Studies/Labs Reviewed:   EKG:  EKG is ordered today.  The ekg ordered today demonstrates sinus rhythm, nonspecific ST changes unchanged from previous tracing, normal QT interval  Recent Labs: 06/17/2014: TSH 2.367 03/01/2015: ALT 17 03/10/2015: BUN 19; Creatinine, Ser 0.97; Hemoglobin 11.5*; Platelets 178; Potassium 4.0; Sodium 141   Lipid Panel  Component Value Date/Time   CHOL 212* 06/17/2014 1414   TRIG 226* 06/17/2014 1414   HDL 61 06/17/2014 1414   CHOLHDL 3.5 06/17/2014 1414   VLDL 45* 06/17/2014 1414   LDLCALC 106* 06/17/2014 1414    ASSESSMENT:    1. Paroxysmal atrial fibrillation (HCC)   2. Obesity (BMI 30-39.9)      PLAN:  In order of problems listed above:  1. No clinical evidence of recurrent sustained atrial fibrillation, palpitations well controlled on propafenone and metoprolol. Once she fully recovers from her hip replacement, ischemic evaluation with a plain treadmill stress test would be beneficial as long as she continues to take a class I antiarrhythmic. 2. She hopes that now that she is able to exercise she will make some advances with weight loss    Medication Adjustments/Labs and Tests Ordered: Current medicines are reviewed at length with the patient today.  Concerns regarding medicines are outlined above.  Medication changes, Labs and Tests ordered today are listed in the Patient Instructions below. Patient Instructions  Dr. Sallyanne Kuster recommends that you schedule a follow-up appointment in: [place], MD  04/20/2015 9:17 AM    [address], [place], [place]  [address] Phone: [phone]; Fax: [phone]

## 2015-06-21 ENCOUNTER — Telehealth: Payer: Self-pay | Admitting: Obstetrics and Gynecology

## 2015-06-21 NOTE — Telephone Encounter (Signed)
Patient has an aex appointment 06/28/15 with Dr.Silva. Patient sent a MyChart message asking to have Hep C screening done at her appointment?Marland Kitchen

## 2015-06-21 NOTE — Telephone Encounter (Signed)
Spoke with patient. Patient states that she would like to have a Hep C screening done as she saw on mychart that this was recommended. Advised this can be performed at her aex on 06/28/2015 with Dr.Silva. Advised to speak with Dr.Silva about this testing at her appointment so that she can have lab work done. She is agreeable and verbalizes understanding.  Routing to provider for final review. Patient agreeable to disposition. Will close encounter.

## 2015-06-28 ENCOUNTER — Ambulatory Visit (INDEPENDENT_AMBULATORY_CARE_PROVIDER_SITE_OTHER): Payer: Medicare Other | Admitting: Obstetrics and Gynecology

## 2015-06-28 ENCOUNTER — Encounter: Payer: Self-pay | Admitting: Obstetrics and Gynecology

## 2015-06-28 VITALS — BP 140/68 | HR 60 | Resp 16 | Ht 67.0 in | Wt 240.8 lb

## 2015-06-28 DIAGNOSIS — Z01419 Encounter for gynecological examination (general) (routine) without abnormal findings: Secondary | ICD-10-CM

## 2015-06-28 DIAGNOSIS — Z1211 Encounter for screening for malignant neoplasm of colon: Secondary | ICD-10-CM | POA: Diagnosis not present

## 2015-06-28 DIAGNOSIS — Z113 Encounter for screening for infections with a predominantly sexual mode of transmission: Secondary | ICD-10-CM

## 2015-06-28 LAB — HEPATITIS C ANTIBODY: HCV AB: NEGATIVE

## 2015-06-28 LAB — HIV ANTIBODY (ROUTINE TESTING W REFLEX): HIV 1&2 Ab, 4th Generation: NONREACTIVE

## 2015-06-28 NOTE — Patient Instructions (Signed)

## 2015-06-28 NOTE — Progress Notes (Signed)
Patient ID: Donna Moon, female   DOB: 04-Dec-1950, 65 y.o.   MRN: [id number] 64 y.o. [id number] Married Caucasian female here for annual exam.    Having pain/bleeding with intercourse.  No other bleeding.  Did not like Premarin so she stopped.   Some palpitations.  Seeing cardiology for this.   Had right hip replacement and she is doing well.  Bone density was declined.  [place] and broke her right hip in 2016 and eventually had a right hip replacement.  Anticipating future right knee and left shoulder surgery possible.   Not interested in left shoulder surgery. Uncertain if really has lupus or not.  Was treated for this for 10 years due to 2 positive tests. Repeat testing was negative more recently two years ago.  Sister passed from Lupus within the last year.   PCP:  Dr.Baucomb   Patient's last menstrual period was 03/18/2001.           Sexually active: Yes.   female The current method of family planning is post menopausal status.    Exercising: Yes.    works out at [employer]:  Former  Health Maintenance: Pap:  06-10-13 Neg:Neg HR HPV History of abnormal Pap:  no MMG:  09-26-14 Density Grade 3/Neg/BiRads1:[place] [facility] Colonoscopy:   2009 normal in [place]. Saw Dr. Carlean Purl 2016 and was advised next colonoscopy due 2019. BMD:  2016  Result  Radiologist unable to perform--patient has multiple fractures and several hip/knee replacements. TDaP:  2011 Gardasil:   N/A HIV:  Will test today. Hep C: Will test today. Screening Labs:  Hb today: 02/2015, Urine today: not done   reports that she quit smoking about 29 years ago. Her smoking use included Cigarettes. She has a 4.5 pack-year smoking history. She has never used smokeless tobacco. She reports that she does not drink alcohol or use illicit drugs.  Past Medical History  Diagnosis Date  . Atrial fibrillation ([place])   . Dizziness   . Obesity   . Asthma   . Hx of seasonal allergies     tx. Allegra, Flonase  . Blood  transfusion without reported diagnosis     as a child--age 89  . Dyspareunia   . Osteoporosis   . Arthritis   . PONV (postoperative nausea and vomiting)     however no problems with past surgeries here.  . Pneumonia     hx of   . Hematuria     hx of has been worked up and no problems   . Cancer [facility])     Mohs surgery for skin cancer on right side of nose     Past Surgical History  Procedure Laterality Date  . Carpel tunnel Bilateral 1993  . Appendectomy  1968  . Oophorectomy  2009  . Cardiac electrophysiology study and ablation  1997 & 2011    x 2  . Cesarean section  1986  . Total hip arthroplasty  12/27/2010    x2 replacements, x1 repair.  . Rotator cuff repair Right 1999    x2 -'97, 99  . Total knee arthroplasty Left 12/07/2012    Procedure: LEFT TOTAL KNEE ARTHROPLASTY;  Surgeon: Gearlean Alf, MD;  [facility];  Service: Orthopedics;  Laterality: Left;  . Cervical cone biopsy      laparoscopic BSO--endometrioma  . Colonoscopy  2009    Dr. Algis Greenhouse NL  . Upper gastrointestinal endoscopy  2009    Shiflett, NL  . Total hip arthroplasty Right 03/08/2015  Procedure: TOTAL RIGHT  HIP ARTHROPLASTY ANTERIOR APPROACH;  Surgeon: Gaynelle Arabian, MD;  [facility];  Service: Orthopedics;  Laterality: Right;    Current Outpatient Prescriptions  Medication Sig Dispense Refill  . albuterol (PROVENTIL HFA;VENTOLIN HFA) 108 (90 BASE) MCG/ACT inhaler Inhale 2 puffs into the lungs every 6 (six) hours as needed for wheezing.    Marland Kitchen aspirin 325 MG tablet Take 325 mg by mouth daily.    . Calcium Carb-Cholecalciferol (CALCIUM+D3 PO) Take 600 mg by mouth daily.    . fexofenadine (ALLEGRA) 180 MG tablet Take 180 mg by mouth daily.    . fluticasone (FLONASE) 50 MCG/ACT nasal spray Place 2 sprays into both nostrils daily.     Marland Kitchen lactobacillus acidophilus (BACID) TABS tablet Take 1 tablet by mouth daily.    . lansoprazole (PREVACID) 30 MG capsule Take 30 mg by mouth daily.    .  metoprolol tartrate (LOPRESSOR) 25 MG tablet Take 1 tablet by mouth in morning, take 2 tablets by mouth in evening. 90 tablet 11  . propafenone (RYTHMOL) 150 MG tablet TAKE  (1)  TABLET  EVERY EIGHT HOURS. 90 tablet 11   No current facility-administered medications for this visit.    Family History  Problem Relation Age of Onset  . Stroke Mother   . Ovarian cancer Mother 67    dec uterine or ovarian ca  . Ulcerative colitis Mother   . Kidney disease Mother   . Heart attack Father   . Heart attack Brother   . Diabetes Sister     AODM  . Diabetes Sister     AODM  . Irritable bowel syndrome Sister   . Cancer Sister     kidney cancer  . Diabetes Maternal Grandmother   . Diabetes Paternal Grandmother   . Lupus Sister     dec age 20 complications from Lupus    ROS:  Pertinent items are noted in HPI.  Otherwise, a comprehensive ROS was negative.  Exam:   BP 140/68 mmHg  Pulse 60  Resp 16  Ht 5\' 7"  (1.702 m)  Wt 240 lb 12.8 oz (109.226 kg)  BMI 37.71 kg/m2  LMP 03/18/2001    General appearance: alert, cooperative and appears stated age Head: Normocephalic, without obvious abnormality, atraumatic Neck: no adenopathy, supple, symmetrical, trachea midline and thyroid normal to inspection and palpation Lungs: clear to auscultation bilaterally Breasts: normal appearance, no masses or tenderness, Inspection negative, No nipple retraction or dimpling, No nipple discharge or bleeding, No axillary or supraclavicular adenopathy Heart: regular rate and rhythm Abdomen: incisions:  Yes.     Abdomen is soft, non-tender; no masses, no organomegaly Extremities:  Joint distortion of toes. Skin: Skin color, texture, turgor normal. No rashes or lesions Lymph nodes: Cervical, supraclavicular, and axillary nodes normal. No abnormal inguinal nodes palpated Neurologic: Grossly normal  Pelvic: External genitalia:  no lesions              Urethra:  normal appearing urethra with no masses,  tenderness or lesions              Bartholins and Skenes: normal                 Vagina:  Generalized erythema of the vagina with orange discharge consistent with atrophy.              Cervix: no lesions              Pap taken: No. Bimanual Exam:  Uterus:  normal size, contour, position, consistency, mobility, non-tender              Adnexa: no mass, fullness, tenderness              Rectal exam: Yes.  .  Confirms.              Anus:  normal sphincter tone.  Small hemorrhoids palpable.  States she has a history of this.   Chaperone was present for exam.  Assessment:   Well woman visit with normal exam. Hx cervical cone biopsy.  Hx lap BSO - endometrioma. Vaginal atrophy.  Hemorrhoids.  Possible hx of lupus.   Osteoporosis?  Not known diagnosis to the patient.   Plan: Yearly mammogram recommended after age 1.  Recommended self breast exam.  Pap and HR HPV as above. Discussed Calcium, Vitamin D, regular exercise program including cardiovascular and weight bearing exercise. Labs performed.  Yes.  .   See orders.  IFOB, HIV and Hep C.  Prescription medication(s) given.  No..    Follow up annually and prn.       After visit summary provided.

## 2015-07-18 ENCOUNTER — Encounter: Payer: Self-pay | Admitting: Obstetrics and Gynecology

## 2015-07-23 ENCOUNTER — Telehealth: Payer: Self-pay | Admitting: *Deleted

## 2015-07-23 NOTE — Telephone Encounter (Signed)
My Chart message from patient:  Good Morning [name],  I received your message On Thursday and I have confirmed that we did receive your kit back in the office. We have not yet run this to obtain your result and before I could get that done for you, we had a water issue in our building and had to close the office unexpectedly. We expect to reopen sometime on Monday. I will try to get this result for you on Monday; however,, as I mentioned we have been closed since Thursday midday and I am unsure if I can get it completed on Monday. I will do my best to get you this result by midweek.  Please know the delay is not reflective of a problem with your result. I will be back in touch and you may feel free to follow back up with me directly.  Thank you, Lamont Snowball, RN Clinical Supervisor ===View-only below this line===   ----- Message -----    From: Donna Moon    Sent: 07/18/2015  8:50 PM EDT      To: Arloa Koh, MD Subject: Non-Urgent Medical Question  I did the at home fecal test and I have not heard the results can you tell me if they are back and if so is all okay?

## 2015-08-07 LAB — FECAL OCCULT BLOOD, IMMUNOCHEMICAL: IFOBT: NEGATIVE

## 2015-08-07 NOTE — Addendum Note (Signed)
Addended by: Graylon Good on: 08/07/2015 01:29 PM   Modules accepted: Orders, SmartSet

## 2015-08-07 NOTE — Telephone Encounter (Signed)
Results to your office.

## 2015-08-07 NOTE — Telephone Encounter (Signed)
Note sent to patient through My Chart.  IFOB negative.  Encounter closed.

## 2015-10-04 ENCOUNTER — Encounter: Payer: Self-pay | Admitting: Obstetrics and Gynecology

## 2016-01-10 IMAGING — DX DG PORTABLE PELVIS
1 series · 1 of 1 positions shown · non-contrast
Comparison: 06/06/2010

CLINICAL DATA: Postop right hip surgery.

EXAM:
PORTABLE PELVIS 1-2 VIEWS

[pelvis ap]
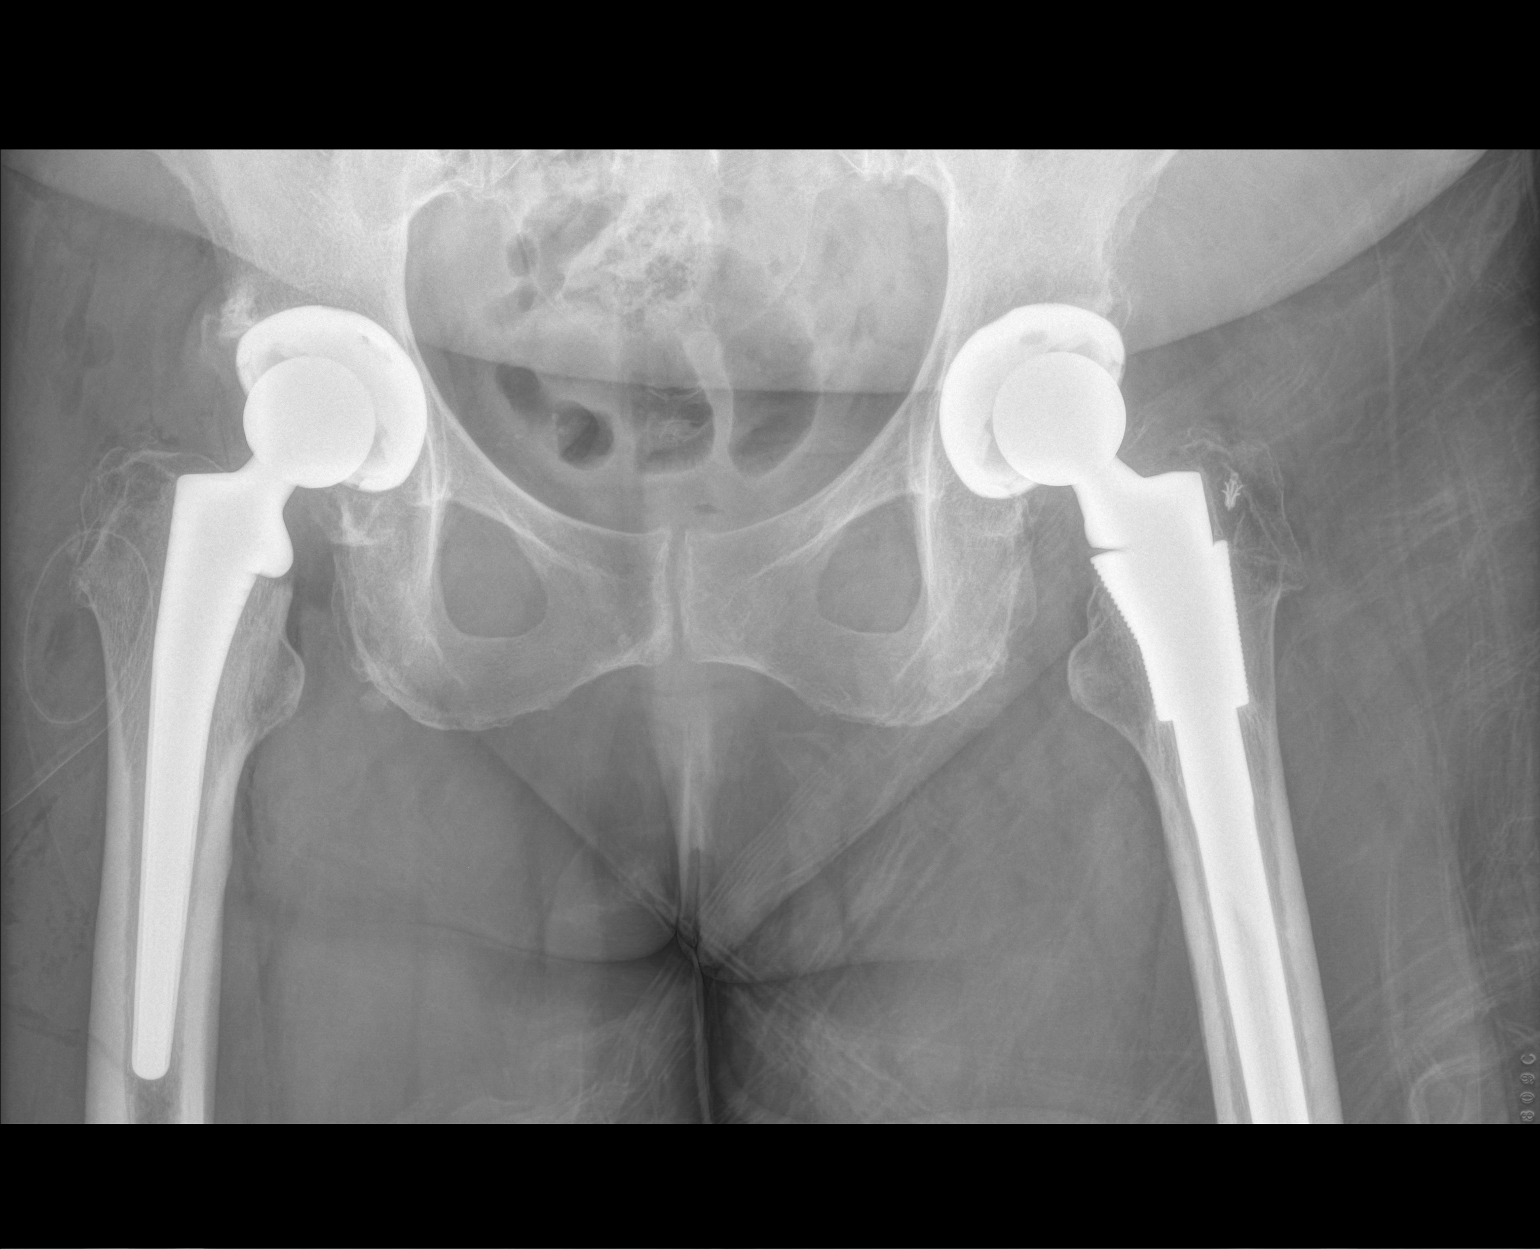

[1 of 1 positions shown; findings below may reference images not displayed]

FINDINGS: There is a new right hip arthroplasty. Surgical drain is present.
Expected lucency in the soft tissues. Again noted is the left hip
replacement. Visualized pelvic bony ring is intact.
IMPRESSION: Placement of right hip arthroplasty. Expected postoperative changes.

## 2016-02-20 ENCOUNTER — Encounter (HOSPITAL_COMMUNITY)
Admission: RE | Admit: 2016-02-20 | Discharge: 2016-02-20 | Disposition: A | Discharge status: Home / Self Care | Payer: Medicare Other | Source: Ambulatory Visit | Attending: Orthopedic Surgery | Admitting: Orthopedic Surgery

## 2016-02-20 ENCOUNTER — Encounter (HOSPITAL_COMMUNITY): Payer: Self-pay

## 2016-02-20 DIAGNOSIS — M75102 Unspecified rotator cuff tear or rupture of left shoulder, not specified as traumatic: Secondary | ICD-10-CM | POA: Insufficient documentation

## 2016-02-20 DIAGNOSIS — Z01812 Encounter for preprocedural laboratory examination: Secondary | ICD-10-CM | POA: Insufficient documentation

## 2016-02-20 HISTORY — DX: Cardiac arrhythmia, unspecified: I49.9

## 2016-02-20 LAB — CBC
HCT: 40.1 % (ref 36.0–46.0)
Hemoglobin: 13.2 g/dL (ref 12.0–15.0)
MCH: 30.5 pg (ref 26.0–34.0)
MCHC: 32.9 g/dL (ref 30.0–36.0)
MCV: 92.6 fL (ref 78.0–100.0)
PLATELETS: 225 10*3/uL (ref 150–400)
RBC: 4.33 MIL/uL (ref 3.87–5.11)
RDW: 13.4 % (ref 11.5–15.5)
WBC: 6.2 10*3/uL (ref 4.0–10.5)

## 2016-02-20 LAB — SURGICAL PCR SCREEN
MRSA, PCR: NEGATIVE
STAPHYLOCOCCUS AUREUS: NEGATIVE

## 2016-02-20 LAB — BASIC METABOLIC PANEL
Anion gap: 5 (ref 5–15)
BUN: 15 mg/dL (ref 6–20)
CALCIUM: 9.3 mg/dL (ref 8.9–10.3)
CO2: 25 mmol/L (ref 22–32)
CREATININE: 1.07 mg/dL — AB (ref 0.44–1.00)
Chloride: 109 mmol/L (ref 101–111)
GFR calc Af Amer: >60 mL/min (ref >60)
GFR, EST NON AFRICAN AMERICAN: 53 mL/min — AB (ref >60)
Glucose, Bld: 106 mg/dL — ABNORMAL HIGH (ref 65–99)
Potassium: 4.5 mmol/L (ref 3.5–5.1)
SODIUM: 139 mmol/L (ref 135–145)

## 2016-02-20 NOTE — Pre-Procedure Instructions (Signed)
    Donna Moon  02/20/2016      [place], [place] - [facility] [address] [place] [address] Phone: [phone] Fax: [phone]  [place], [place] - [address] [place] [address] Phone: [phone] Fax: [phone]    Your procedure is scheduled on thurs, Dec. 14  Report to [place] at 5:30 A.M.  Call this number if you have problems the morning of surgery:  [phone]   Remember:  Do not eat food or drink liquids after midnight on wed. Dec. 13   Take these medicines the morning of surgery with A SIP OF WATER: Tylenol if needed, albuterol inhaler if needed--bring to hospital,lansoprazole (prevacid), metoprolol (lopressor), propafenone (rythmol)               1 week prior to surgery stop advil, motrin, ibuprofen, aleve, BC Powders, Goody's, vitamins/herbal medicines.                Stop aspirin per Dr. Onnie Graham   Do not wear jewelry, make-up or nail polish.  Do not wear lotions, powders, or perfumes, or deoderant.  Do not shave 48 hours prior to surgery.  Men may shave face and neck.  Do not bring valuables to the hospital.  [facility] is not responsible for any belongings or valuables.  Contacts, dentures or bridgework may not be worn into surgery.  Leave your suitcase in the car.  After surgery it may be brought to your room.  For patients admitted to the hospital, discharge time will be determined by your treatment team.  Patients discharged the day of surgery will not be allowed to drive home.    Special instructions:  Review preparing for surgery  Please read over the following fact sheets that you were given. Coughing and Deep Breathing and MRSA Information

## 2016-02-20 NOTE — Progress Notes (Signed)
PCP: Dr. Hulan Fess @ [facility] [place]  Cardiologist: Dr. Sallyanne Kuster  Pt. Stated Dr. Onnie Graham instructed her to stop asa on  Thurs. Dec. 7

## 2016-02-21 NOTE — Progress Notes (Addendum)
Anesthesia Chart Review: Patient is a 65 year old female scheduled for left shoulder reverse arthroplasty on 02/29/16 by Dr. Onnie Graham.  History includes former smoker (quit '88), post-operative  N/V, afib/PAF (s/p EPS/ablation X 2), dizziness, asthma, arthritis, hematuria (reportedly benign w/u), skin cancer (right nose) s/p MOHS, appendectomy, left ovarian mass s/p BSO '09, left TKA 12/07/12, right THA 03/08/15, left THA '10 with revision '12. BMI is consistent with obesity.  PCP is listed as Neysa Hotter, PA-C. Cardiologist is Dr. Sallyanne Kuster, last visit 04/19/15. She was doing well, but does note that "No clinical evidence of recurrent sustained atrial fibrillation, palpitations well controlled on propafenone and metoprolol. Once she fully recovers from her hip replacement, ischemic evaluation with a plain treadmill stress test would be beneficial as long as she continues to take a class I antiarrhythmic."  Meds include albuterol, ASA 325 mg, Flonase, Prevacid, loratadine, Lopressor, Rythmol. Dr. Onnie Graham instructed her to hold ASA starting 02/22/16.  BP (!) 155/71   Pulse 62   Temp 36.4 C (Oral)   Resp 20   Ht 5\' 8"  (1.727 m)   Wt 245 lb 9.5 oz (111.4 kg)   LMP 03/18/2001   SpO2 100%   BMI 37.34 kg/m   04/19/15 EKG: SB at 58 bpm, ST abnormality, possible digitalis effect. ST abnormality appears diffuse, non-specific.  12/09/05 Cardiac cath: IMPRESSION: 1. Normal left ventricular function. 2. No significant coronary artery disease identified. RECOMMENDATIONS:  Continued medical therapy.  Preoperative labs noted.   Discussed with anesthesiologist Dr. Smith Robert. Recommend cardiology clearance. Velvet at Dr. Augustin Coupe' office aware. Chart will be left for follow-up regarding clearance status.  [facility] Stay [facility]/Anesthesiology Phone [phone] 02/21/2016 4:12 PM  Addendum: Dr. Sallyanne Kuster sent a letter of cardiac clearance on 02/26/16. He felt she was low risk from a  cardiac standpoint and recommended continuing metoprolol throughout the perioperative period. (See Letters tab.)  [facility] Short Stay Center/Anesthesiology Phone [phone] 02/27/2016 4:34 PM

## 2016-02-23 ENCOUNTER — Telehealth: Payer: Self-pay

## 2016-02-23 NOTE — Telephone Encounter (Signed)
Request for surgical clearance:   1. What type surgery is being performed? Reversed total left shoulder  2. When is this surgery scheduled? 02/29/16  3. Are there any medications that need to be held prior to surgery and how long? N/A  4. Name of the physician performing surgery: Dr Lennette Bihari Supple  5. What is the office phone and fax number?   Phone [phone]  Fax [phone]

## 2016-02-26 ENCOUNTER — Encounter: Payer: Self-pay | Admitting: Cardiovascular Disease

## 2016-02-26 NOTE — Telephone Encounter (Signed)
Sent via epic 

## 2016-02-28 MED ORDER — CEFAZOLIN SODIUM-DEXTROSE 2-4 GM/100ML-% IV SOLN
2.0000 g | INTRAVENOUS | Status: AC
  Administered 2016-02-29: 2 g via INTRAVENOUS
  Filled 2016-02-28: qty 100

## 2016-02-28 MED ORDER — TRANEXAMIC ACID 1000 MG/10ML IV SOLN
1000.0000 mg | INTRAVENOUS | Status: DC
  Filled 2016-02-28: qty 10

## 2016-02-28 MED ORDER — TRANEXAMIC ACID 1000 MG/10ML IV SOLN
1000.0000 mg | INTRAVENOUS | Status: AC
  Administered 2016-02-29: 1000 mg via INTRAVENOUS
  Filled 2016-02-28: qty 10

## 2016-02-28 NOTE — Telephone Encounter (Signed)
Faxed clearance letter to Velvet McBride at [facility]. 

## 2016-02-28 NOTE — Anesthesia Preprocedure Evaluation (Addendum)
Anesthesia Evaluation  Patient identified by MRN, date of birth, ID band Patient awake    Reviewed: Allergy & Precautions, NPO status , Patient's Chart, lab work & pertinent test results  History of Anesthesia Complications (+) PONV and history of anesthetic complications  Airway Mallampati: II  TM Distance: >3 FB Neck ROM: Full    Dental  (+) Teeth Intact, Dental Advisory Given   Pulmonary asthma , former smoker,    breath sounds clear to auscultation       Cardiovascular hypertension, Pt. on home beta blockers + dysrhythmias  Rhythm:Regular Rate:Normal     Neuro/Psych negative neurological ROS  negative psych ROS   GI/Hepatic Neg liver ROS, GERD  Medicated,  Endo/Other  negative endocrine ROS  Renal/GU negative Renal ROS  negative genitourinary   Musculoskeletal  (+) Arthritis , Osteoarthritis,    Abdominal   Peds negative pediatric ROS (+)  Hematology negative hematology ROS (+)   Anesthesia Other Findings   Reproductive/Obstetrics negative OB ROS                            Lab Results  Component Value Date   WBC 6.2 02/20/2016   HGB 13.2 02/20/2016   HCT 40.1 02/20/2016   MCV 92.6 02/20/2016   PLT 225 02/20/2016   Lab Results  Component Value Date   CREATININE 1.07 (H) 02/20/2016   BUN 15 02/20/2016   NA 139 02/20/2016   K 4.5 02/20/2016   CL 109 02/20/2016   CO2 25 02/20/2016   Lab Results  Component Value Date   INR 0.95 03/01/2015   INR 0.92 11/30/2012   INR 0.99 05/31/2010   EKG: Sinus Loletha Grayer  Anesthesia Physical Anesthesia Plan  ASA: II  Anesthesia Plan: General   Post-op Pain Management: GA combined w/ Regional for post-op pain   Induction: Intravenous  Airway Management Planned: Oral ETT  Additional Equipment:   Intra-op Plan:   Post-operative Plan: Extubation in OR  Informed Consent: I have reviewed the patients History and Physical, chart,  labs and discussed the procedure including the risks, benefits and alternatives for the proposed anesthesia with the patient or authorized representative who has indicated his/her understanding and acceptance.   Dental advisory given  Plan Discussed with: CRNA  Anesthesia Plan Comments:         Anesthesia Quick Evaluation

## 2016-02-29 ENCOUNTER — Inpatient Hospital Stay (HOSPITAL_COMMUNITY): Payer: Medicare Other | Admitting: Anesthesiology

## 2016-02-29 ENCOUNTER — Encounter (HOSPITAL_COMMUNITY): Payer: Self-pay | Admitting: Urology

## 2016-02-29 ENCOUNTER — Inpatient Hospital Stay (HOSPITAL_COMMUNITY): Payer: Medicare Other | Admitting: Vascular Surgery

## 2016-02-29 ENCOUNTER — Inpatient Hospital Stay (HOSPITAL_COMMUNITY)
Admission: RE | Admit: 2016-02-29 | Discharge: 2016-03-01 | DRG: 483 | Disposition: A | Discharge status: Home / Self Care | Payer: Medicare Other | Source: Ambulatory Visit | Attending: Orthopedic Surgery | Admitting: Orthopedic Surgery

## 2016-02-29 ENCOUNTER — Encounter (HOSPITAL_COMMUNITY)
Admission: RE | Disposition: A | Discharge status: Home / Self Care | Payer: Self-pay | Source: Ambulatory Visit | Attending: Orthopedic Surgery

## 2016-02-29 DIAGNOSIS — Z79899 Other long term (current) drug therapy: Secondary | ICD-10-CM | POA: Diagnosis not present

## 2016-02-29 DIAGNOSIS — Z91048 Other nonmedicinal substance allergy status: Secondary | ICD-10-CM

## 2016-02-29 DIAGNOSIS — Z6837 Body mass index (BMI) 37.0-37.9, adult: Secondary | ICD-10-CM | POA: Diagnosis not present

## 2016-02-29 DIAGNOSIS — Z96652 Presence of left artificial knee joint: Secondary | ICD-10-CM | POA: Diagnosis present

## 2016-02-29 DIAGNOSIS — Z7982 Long term (current) use of aspirin: Secondary | ICD-10-CM | POA: Diagnosis not present

## 2016-02-29 DIAGNOSIS — J45909 Unspecified asthma, uncomplicated: Secondary | ICD-10-CM | POA: Diagnosis present

## 2016-02-29 DIAGNOSIS — M810 Age-related osteoporosis without current pathological fracture: Secondary | ICD-10-CM | POA: Diagnosis present

## 2016-02-29 DIAGNOSIS — Z96643 Presence of artificial hip joint, bilateral: Secondary | ICD-10-CM | POA: Diagnosis present

## 2016-02-29 DIAGNOSIS — Z85828 Personal history of other malignant neoplasm of skin: Secondary | ICD-10-CM

## 2016-02-29 DIAGNOSIS — Z87891 Personal history of nicotine dependence: Secondary | ICD-10-CM | POA: Diagnosis not present

## 2016-02-29 DIAGNOSIS — E669 Obesity, unspecified: Secondary | ICD-10-CM | POA: Diagnosis present

## 2016-02-29 DIAGNOSIS — Z96612 Presence of left artificial shoulder joint: Secondary | ICD-10-CM

## 2016-02-29 DIAGNOSIS — M13812 Other specified arthritis, left shoulder: Secondary | ICD-10-CM | POA: Diagnosis present

## 2016-02-29 DIAGNOSIS — M25512 Pain in left shoulder: Secondary | ICD-10-CM | POA: Diagnosis present

## 2016-02-29 HISTORY — PX: REVERSE SHOULDER ARTHROPLASTY: SHX5054

## 2016-02-29 SURGERY — ARTHROPLASTY, SHOULDER, TOTAL, REVERSE
Anesthesia: Regional | Site: Shoulder | Laterality: Left

## 2016-02-29 MED ORDER — PROPOFOL 500 MG/50ML IV EMUL
INTRAVENOUS | Status: DC | PRN
  Administered 2016-02-29: 25 ug/kg/min via INTRAVENOUS

## 2016-02-29 MED ORDER — SUCCINYLCHOLINE CHLORIDE 20 MG/ML IJ SOLN
INTRAMUSCULAR | Status: DC | PRN
  Administered 2016-02-29: 120 mg via INTRAVENOUS

## 2016-02-29 MED ORDER — LIDOCAINE HCL (CARDIAC) 20 MG/ML IV SOLN
INTRAVENOUS | Status: DC | PRN
  Administered 2016-02-29: 60 mg via INTRATRACHEAL

## 2016-02-29 MED ORDER — ONDANSETRON HCL 4 MG/2ML IJ SOLN
4.0000 mg | Freq: Four times a day (QID) | INTRAMUSCULAR | Status: DC | PRN
  Administered 2016-02-29: 4 mg via INTRAVENOUS
  Filled 2016-02-29 (×2): qty 2

## 2016-02-29 MED ORDER — DIAZEPAM 5 MG PO TABS
2.5000 mg | ORAL_TABLET | Freq: Four times a day (QID) | ORAL | Status: DC | PRN
  Administered 2016-02-29: 5 mg via ORAL
  Filled 2016-02-29 (×2): qty 1

## 2016-02-29 MED ORDER — BISACODYL 5 MG PO TBEC: 5.0000 mg | DELAYED_RELEASE_TABLET | Freq: Every day | ORAL | Status: DC | PRN

## 2016-02-29 MED ORDER — BUPIVACAINE-EPINEPHRINE (PF) 0.5% -1:200000 IJ SOLN
INTRAMUSCULAR | Status: DC | PRN
  Administered 2016-02-29: 15 mL via PERINEURAL

## 2016-02-29 MED ORDER — ACETAMINOPHEN 325 MG PO TABS
650.0000 mg | ORAL_TABLET | Freq: Four times a day (QID) | ORAL | Status: DC | PRN
  Administered 2016-02-29: 650 mg via ORAL
  Filled 2016-02-29: qty 2

## 2016-02-29 MED ORDER — FENTANYL CITRATE (PF) 100 MCG/2ML IJ SOLN
INTRAMUSCULAR | Status: AC
  Filled 2016-02-29: qty 2

## 2016-02-29 MED ORDER — PROPOFOL 10 MG/ML IV BOLUS
INTRAVENOUS | Status: AC
  Filled 2016-02-29: qty 20

## 2016-02-29 MED ORDER — KETAMINE HCL 10 MG/ML IJ SOLN
INTRAMUSCULAR | Status: DC | PRN
  Administered 2016-02-29: 20 mg via INTRAVENOUS

## 2016-02-29 MED ORDER — ACETAMINOPHEN 650 MG RE SUPP: 650.0000 mg | Freq: Four times a day (QID) | RECTAL | Status: DC | PRN

## 2016-02-29 MED ORDER — MAGNESIUM CITRATE PO SOLN: 1.0000 | Freq: Once | ORAL | Status: DC | PRN

## 2016-02-29 MED ORDER — DOCUSATE SODIUM 100 MG PO CAPS
100.0000 mg | ORAL_CAPSULE | Freq: Two times a day (BID) | ORAL | Status: DC
  Administered 2016-02-29 (×2): 100 mg via ORAL
  Filled 2016-02-29 (×2): qty 1

## 2016-02-29 MED ORDER — KETOROLAC TROMETHAMINE 15 MG/ML IJ SOLN
15.0000 mg | Freq: Four times a day (QID) | INTRAMUSCULAR | Status: AC
  Administered 2016-02-29 – 2016-03-01 (×4): 15 mg via INTRAVENOUS
  Filled 2016-02-29 (×4): qty 1

## 2016-02-29 MED ORDER — MIDAZOLAM HCL 2 MG/2ML IJ SOLN
INTRAMUSCULAR | Status: DC | PRN
  Administered 2016-02-29 (×2): 1 mg via INTRAVENOUS

## 2016-02-29 MED ORDER — PHENOL 1.4 % MT LIQD: 1.0000 | OROMUCOSAL | Status: DC | PRN

## 2016-02-29 MED ORDER — ARTIFICIAL TEARS OP OINT
TOPICAL_OINTMENT | OPHTHALMIC | Status: AC
  Filled 2016-02-29: qty 3.5

## 2016-02-29 MED ORDER — METOCLOPRAMIDE HCL 5 MG PO TABS: 5.0000 mg | ORAL_TABLET | Freq: Three times a day (TID) | ORAL | Status: DC | PRN

## 2016-02-29 MED ORDER — ASPIRIN EC 325 MG PO TBEC
325.0000 mg | DELAYED_RELEASE_TABLET | Freq: Every day | ORAL | Status: DC
  Administered 2016-02-29: 325 mg via ORAL
  Filled 2016-02-29: qty 1

## 2016-02-29 MED ORDER — FENTANYL CITRATE (PF) 100 MCG/2ML IJ SOLN
INTRAMUSCULAR | Status: DC | PRN
  Administered 2016-02-29 (×2): 25 ug via INTRAVENOUS
  Administered 2016-02-29: 50 ug via INTRAVENOUS

## 2016-02-29 MED ORDER — PANTOPRAZOLE SODIUM 20 MG PO TBEC: 20.0000 mg | DELAYED_RELEASE_TABLET | Freq: Every day | ORAL | Status: DC

## 2016-02-29 MED ORDER — ARTIFICIAL TEARS OP OINT
TOPICAL_OINTMENT | OPHTHALMIC | Status: DC | PRN
  Administered 2016-02-29: 1 via OPHTHALMIC

## 2016-02-29 MED ORDER — KETAMINE HCL-SODIUM CHLORIDE 100-0.9 MG/10ML-% IV SOSY
PREFILLED_SYRINGE | INTRAVENOUS | Status: AC
  Filled 2016-02-29: qty 10

## 2016-02-29 MED ORDER — OXYCODONE HCL 5 MG PO TABS
5.0000 mg | ORAL_TABLET | ORAL | Status: DC | PRN
  Administered 2016-02-29 – 2016-03-01 (×4): 10 mg via ORAL
  Filled 2016-02-29 (×5): qty 2

## 2016-02-29 MED ORDER — MIDAZOLAM HCL 2 MG/2ML IJ SOLN
INTRAMUSCULAR | Status: AC
  Filled 2016-02-29: qty 2

## 2016-02-29 MED ORDER — HYDROMORPHONE HCL 1 MG/ML IJ SOLN: 1.0000 mg | INTRAMUSCULAR | Status: DC | PRN

## 2016-02-29 MED ORDER — PHENYLEPHRINE HCL 10 MG/ML IJ SOLN
INTRAVENOUS | Status: DC | PRN
  Administered 2016-02-29: 50 ug/min via INTRAVENOUS

## 2016-02-29 MED ORDER — PROPOFOL 1000 MG/100ML IV EMUL
INTRAVENOUS | Status: AC
  Filled 2016-02-29: qty 100

## 2016-02-29 MED ORDER — ONDANSETRON HCL 4 MG/2ML IJ SOLN
INTRAMUSCULAR | Status: AC
  Filled 2016-02-29: qty 2

## 2016-02-29 MED ORDER — SODIUM CHLORIDE 0.9 % IR SOLN
Status: DC | PRN
  Administered 2016-02-29: 1000 mL

## 2016-02-29 MED ORDER — LACTATED RINGERS IV SOLN
INTRAVENOUS | Status: DC | PRN
  Administered 2016-02-29 (×2): via INTRAVENOUS

## 2016-02-29 MED ORDER — ONDANSETRON HCL 4 MG PO TABS
4.0000 mg | ORAL_TABLET | Freq: Four times a day (QID) | ORAL | Status: DC | PRN
  Filled 2016-02-29: qty 1

## 2016-02-29 MED ORDER — SUCCINYLCHOLINE CHLORIDE 200 MG/10ML IV SOSY
PREFILLED_SYRINGE | INTRAVENOUS | Status: AC
  Filled 2016-02-29: qty 10

## 2016-02-29 MED ORDER — METOPROLOL TARTRATE 25 MG PO TABS
25.0000 mg | ORAL_TABLET | Freq: Two times a day (BID) | ORAL | Status: DC
  Administered 2016-02-29: 25 mg via ORAL
  Filled 2016-02-29: qty 1

## 2016-02-29 MED ORDER — POLYETHYLENE GLYCOL 3350 17 G PO PACK: 17.0000 g | PACK | Freq: Every day | ORAL | Status: DC | PRN

## 2016-02-29 MED ORDER — PROPOFOL 10 MG/ML IV BOLUS
INTRAVENOUS | Status: DC | PRN
  Administered 2016-02-29: 50 mg via INTRAVENOUS
  Administered 2016-02-29: 150 mg via INTRAVENOUS

## 2016-02-29 MED ORDER — CHLORHEXIDINE GLUCONATE 4 % EX LIQD: 60.0000 mL | Freq: Once | CUTANEOUS | Status: DC

## 2016-02-29 MED ORDER — DEXAMETHASONE SODIUM PHOSPHATE 10 MG/ML IJ SOLN
INTRAMUSCULAR | Status: DC | PRN
  Administered 2016-02-29: 10 mg via INTRAVENOUS

## 2016-02-29 MED ORDER — PROPAFENONE HCL 150 MG PO TABS
150.0000 mg | ORAL_TABLET | Freq: Three times a day (TID) | ORAL | Status: DC
  Administered 2016-02-29 – 2016-03-01 (×2): 150 mg via ORAL
  Filled 2016-02-29 (×4): qty 1

## 2016-02-29 MED ORDER — ONDANSETRON HCL 4 MG/2ML IJ SOLN
INTRAMUSCULAR | Status: DC | PRN
  Administered 2016-02-29: 4 mg via INTRAVENOUS

## 2016-02-29 MED ORDER — ALBUTEROL SULFATE (2.5 MG/3ML) 0.083% IN NEBU: 3.0000 mL | INHALATION_SOLUTION | Freq: Four times a day (QID) | RESPIRATORY_TRACT | Status: DC | PRN

## 2016-02-29 MED ORDER — MENTHOL 3 MG MT LOZG: 1.0000 | LOZENGE | OROMUCOSAL | Status: DC | PRN

## 2016-02-29 MED ORDER — DEXAMETHASONE SODIUM PHOSPHATE 10 MG/ML IJ SOLN
INTRAMUSCULAR | Status: AC
  Filled 2016-02-29: qty 1

## 2016-02-29 MED ORDER — GLYCOPYRROLATE 0.2 MG/ML IJ SOLN
INTRAMUSCULAR | Status: DC | PRN
  Administered 2016-02-29 (×2): 0.1 mg via INTRAVENOUS

## 2016-02-29 MED ORDER — CEFAZOLIN SODIUM-DEXTROSE 2-4 GM/100ML-% IV SOLN
2.0000 g | Freq: Four times a day (QID) | INTRAVENOUS | Status: AC
  Administered 2016-02-29 – 2016-03-01 (×3): 2 g via INTRAVENOUS
  Filled 2016-02-29 (×3): qty 100

## 2016-02-29 MED ORDER — METOCLOPRAMIDE HCL 5 MG/ML IJ SOLN: 5.0000 mg | Freq: Three times a day (TID) | INTRAMUSCULAR | Status: DC | PRN

## 2016-02-29 MED ORDER — LIDOCAINE 2% (20 MG/ML) 5 ML SYRINGE
INTRAMUSCULAR | Status: AC
  Filled 2016-02-29: qty 5

## 2016-02-29 MED ORDER — LACTATED RINGERS IV SOLN: INTRAVENOUS | Status: DC

## 2016-02-29 MED ORDER — ALUM & MAG HYDROXIDE-SIMETH 200-200-20 MG/5ML PO SUSP
30.0000 mL | ORAL | Status: DC | PRN
Start: 2016-02-29 — End: 2016-03-01

## 2016-02-29 SURGICAL SUPPLY — 66 items
ADH SKN CLS APL DERMABOND .7 (GAUZE/BANDAGES/DRESSINGS) ×1
AID PSTN UNV HD RSTRNT DISP (MISCELLANEOUS) ×1
BASEPLATE GLENOID SHLDR SM (Shoulder) ×2 IMPLANT
BLADE SAW SGTL 83.5X18.5 (BLADE) ×2 IMPLANT
BSPLAT GLND SM PRFT SHLDR CA (Shoulder) ×1 IMPLANT
COVER SURGICAL LIGHT HANDLE (MISCELLANEOUS) ×2 IMPLANT
CUP SUT UNIV REVERS 36+2 LEFT (Cup) ×2 IMPLANT
DERMABOND ADVANCED (GAUZE/BANDAGES/DRESSINGS) ×1
DERMABOND ADVANCED .7 DNX12 (GAUZE/BANDAGES/DRESSINGS) ×1 IMPLANT
DRAPE ORTHO SPLIT 77X108 STRL (DRAPES) ×4
DRAPE SURG 17X11 SM STRL (DRAPES) ×2 IMPLANT
DRAPE SURG ORHT 6 SPLT 77X108 (DRAPES) ×2 IMPLANT
DRAPE U-SHAPE 47X51 STRL (DRAPES) ×2 IMPLANT
DRSG AQUACEL AG ADV 3.5X10 (GAUZE/BANDAGES/DRESSINGS) ×2 IMPLANT
DURAPREP 26ML APPLICATOR (WOUND CARE) ×2 IMPLANT
ELECT BLADE 4.0 EZ CLEAN MEGAD (MISCELLANEOUS) ×2
ELECT CAUTERY BLADE 6.4 (BLADE) ×2 IMPLANT
ELECT REM PT RETURN 9FT ADLT (ELECTROSURGICAL) ×2
ELECTRODE BLDE 4.0 EZ CLN MEGD (MISCELLANEOUS) ×1 IMPLANT
ELECTRODE REM PT RTRN 9FT ADLT (ELECTROSURGICAL) ×1 IMPLANT
FACESHIELD WRAPAROUND (MASK) ×6 IMPLANT
GLENOSPHERE LATERAL 36MM+4 (Shoulder) ×1 IMPLANT
GLOVE BIO SURGEON STRL SZ7.5 (GLOVE) ×2 IMPLANT
GLOVE BIO SURGEON STRL SZ8 (GLOVE) ×2 IMPLANT
GLOVE EUDERMIC 7 POWDERFREE (GLOVE) ×2 IMPLANT
GLOVE SS BIOGEL STRL SZ 7.5 (GLOVE) ×1 IMPLANT
GLOVE SUPERSENSE BIOGEL SZ 7.5 (GLOVE) ×1
GOWN STRL REUS W/ TWL LRG LVL3 (GOWN DISPOSABLE) IMPLANT
GOWN STRL REUS W/ TWL XL LVL3 (GOWN DISPOSABLE) ×2 IMPLANT
GOWN STRL REUS W/TWL LRG LVL3 (GOWN DISPOSABLE)
GOWN STRL REUS W/TWL XL LVL3 (GOWN DISPOSABLE) ×4
KIT BASIN OR (CUSTOM PROCEDURE TRAY) ×2 IMPLANT
KIT ROOM TURNOVER OR (KITS) ×2 IMPLANT
LINER HUMERAL 36 +3MM SM (Shoulder) ×2 IMPLANT
MANIFOLD NEPTUNE II (INSTRUMENTS) ×2 IMPLANT
NDL SUT .5 MAYO 1.404X.05X (NEEDLE) ×1 IMPLANT
NEEDLE HYPO 25GX1X1/2 BEV (NEEDLE) IMPLANT
NEEDLE MAYO TAPER (NEEDLE) ×2
NS IRRIG 1000ML POUR BTL (IV SOLUTION) ×2 IMPLANT
PACK SHOULDER (CUSTOM PROCEDURE TRAY) ×2 IMPLANT
PAD ARMBOARD 7.5X6 YLW CONV (MISCELLANEOUS) ×4 IMPLANT
PASSER SUT SWANSON 36MM LOOP (INSTRUMENTS) IMPLANT
RESTRAINT HEAD UNIVERSAL NS (MISCELLANEOUS) ×2 IMPLANT
SCREW CENTRAL NONLOCK 6.5X20MM (Shoulder) ×2 IMPLANT
SCREW LOCK PERIPHERAL 30MM (Shoulder) ×1 IMPLANT
SCREW LOCK PERIPHERAL 42MM (Screw) ×2 IMPLANT
SET PIN UNIVERSAL REVERSE (SET/KITS/TRAYS/PACK) ×1 IMPLANT
SLING ARM FOAM STRAP LRG (SOFTGOODS) ×2 IMPLANT
SPONGE LAP 18X18 X RAY DECT (DISPOSABLE) ×2 IMPLANT
SPONGE LAP 4X18 X RAY DECT (DISPOSABLE) ×2 IMPLANT
STEM REVERS UNI SZ6 CAP COATED (Shoulder) ×2 IMPLANT
SUCTION FRAZIER HANDLE 10FR (MISCELLANEOUS) ×1
SUCTION TUBE FRAZIER 10FR DISP (MISCELLANEOUS) ×1 IMPLANT
SUT BONE WAX W31G (SUTURE) IMPLANT
SUT FIBERWIRE #2 38 T-5 BLUE (SUTURE) ×6
SUT MNCRL AB 3-0 PS2 18 (SUTURE) ×2 IMPLANT
SUT MON AB 2-0 CT1 36 (SUTURE) ×2 IMPLANT
SUT VIC AB 1 CT1 27 (SUTURE) ×2
SUT VIC AB 1 CT1 27XBRD ANBCTR (SUTURE) ×1 IMPLANT
SUT VIC AB 2-0 CT1 27 (SUTURE)
SUT VIC AB 2-0 CT1 TAPERPNT 27 (SUTURE) IMPLANT
SUTURE FIBERWR #2 38 T-5 BLUE (SUTURE) ×3 IMPLANT
SYR CONTROL 10ML LL (SYRINGE) IMPLANT
TOWEL OR 17X24 6PK STRL BLUE (TOWEL DISPOSABLE) ×2 IMPLANT
TOWEL OR 17X26 10 PK STRL BLUE (TOWEL DISPOSABLE) ×2 IMPLANT
WATER STERILE IRR 1000ML POUR (IV SOLUTION) ×2 IMPLANT

## 2016-02-29 NOTE — Discharge Instructions (Signed)

## 2016-02-29 NOTE — Anesthesia Procedure Notes (Signed)
Procedure Name: Intubation Date/Time: 02/29/2016 7:45 AM Performed by: Collier Bullock Pre-anesthesia Checklist: Patient identified, Emergency Drugs available, Suction available and Patient being monitored Patient Re-evaluated:Patient Re-evaluated prior to inductionOxygen Delivery Method: Circle system utilized Preoxygenation: Pre-oxygenation with 100% oxygen Intubation Type: IV induction Laryngoscope Size: Mac and 3 Grade View: Grade I Tube type: Oral Number of attempts: 1 Airway Equipment and Method: Stylet Placement Confirmation: ETT inserted through vocal cords under direct vision,  positive ETCO2 and breath sounds checked- equal and bilateral Secured at: 23 cm Tube secured with: Tape Dental Injury: Teeth and Oropharynx as per pre-operative assessment

## 2016-02-29 NOTE — H&P (Signed)
Donna Moon    Chief Complaint: LEFT SHOULDER ROTATOR CUFF ARTHROPATHY HPI: The patient is a 65 y.o. 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 Arthritis   . Asthma   . Atrial fibrillation ([place])   . Blood transfusion without reported diagnosis    as a child--age 84  . Cancer [facility])    Mohs surgery for skin cancer on right side of nose   . Dizziness   . Dyspareunia   . Dysrhythmia    atrial fib  . Hematuria    hx of has been worked up and no problems   . Hx of seasonal allergies    tx. Allegra, Flonase  . Obesity   . Osteoporosis   . Pneumonia    hx of   . PONV (postoperative nausea and vomiting)    however no problems with past surgeries here.    Past Surgical History:  Procedure Laterality Date  . APPENDECTOMY  1968  . CARDIAC ELECTROPHYSIOLOGY STUDY AND ABLATION  1997 & 2011   x 2  . Carpel Tunnel Bilateral 1993  . CERVICAL CONE BIOPSY     laparoscopic BSO--endometrioma  . CESAREAN SECTION  1986  . COLONOSCOPY  2009   Dr. Algis Greenhouse [place]  . OOPHORECTOMY  2009  . ROTATOR CUFF REPAIR Right 1999   x2 -'97, 99  . TOTAL HIP ARTHROPLASTY  12/27/2010   x2 replacements, x1 repair.  Marland Kitchen TOTAL HIP ARTHROPLASTY Right 03/08/2015   Procedure: TOTAL RIGHT  HIP ARTHROPLASTY ANTERIOR APPROACH;  Surgeon: Gaynelle Arabian, MD;  Location: [facility];  Service: Orthopedics;  Laterality: Right;  . TOTAL KNEE ARTHROPLASTY Left 12/07/2012   Procedure: LEFT TOTAL KNEE ARTHROPLASTY;  Surgeon: Gearlean Alf, MD;  Location: [facility];  Service: Orthopedics;  Laterality: Left;  . UPPER GASTROINTESTINAL ENDOSCOPY  2009   [place], [place]    Family History  Problem Relation Age of Onset  . Stroke Mother   . Ovarian cancer Mother 84    dec uterine or ovarian ca  . Ulcerative colitis Mother   . Kidney disease Mother   . Heart attack Father   . Heart attack Brother   . Diabetes Sister     AODM  . Diabetes Sister     AODM  . Irritable  bowel syndrome Sister   . Cancer Sister     kidney cancer  . Diabetes Maternal Grandmother   . Diabetes Paternal Grandmother   . Lupus Sister     dec age 72 complications from Lupus    Social History:  reports that she quit smoking about 29 years ago. Her smoking use included Cigarettes. She has a 4.50 pack-year smoking history. She has never used smokeless tobacco. She reports that she does not drink alcohol or use drugs.   Medications Prior to Admission  Medication Sig Dispense Refill  . acetaminophen (TYLENOL) 500 MG tablet Take 1,000 mg by mouth daily as needed for moderate pain or headache.    Marland Kitchen aspirin 325 MG tablet Take 325 mg by mouth daily.    . Calcium Carb-Cholecalciferol (CALCIUM+D3 PO) Take 600 mg by mouth daily.    . fluticasone (FLONASE) 50 MCG/ACT nasal spray Place 2 sprays into both nostrils daily.     Marland Kitchen lactobacillus acidophilus (BACID) TABS tablet Take 1 tablet by mouth daily.    . lansoprazole (PREVACID) 30 MG capsule Take 30 mg by mouth daily.    Marland Kitchen loratadine (CLARITIN)  10 MG tablet Take 10 mg by mouth at bedtime.    . metoprolol tartrate (LOPRESSOR) 25 MG tablet Take 1 tablet by mouth in morning, take 2 tablets by mouth in evening. 90 tablet 11  . propafenone (RYTHMOL) 150 MG tablet TAKE  (1)  TABLET  EVERY EIGHT HOURS. 90 tablet 11  . albuterol (PROVENTIL HFA;VENTOLIN HFA) 108 (90 BASE) MCG/ACT inhaler Inhale 2 puffs into the lungs every 6 (six) hours as needed for wheezing.       Physical Exam: left shoulder with painful and restricted motion as noted at recent office vistis  Vitals  Temp:  [98.9 F (37.2 C)] 98.9 F (37.2 C) (12/14 0557) Pulse Rate:  [58] 58 (12/14 0557) Resp:  [20] 20 (12/14 0557) BP: (156)/(72) 156/72 (12/14 0557) SpO2:  [100 %] 100 % (12/14 0557) Weight:  [111.1 kg (245 lb)] 111.1 kg (245 lb) (12/14 0557)  Assessment/Plan  Impression: LEFT SHOULDER ROTATOR CUFF ARTHROPATHY  Plan of Action: Procedure(s): REVERSE SHOULDER  ARTHROPLASTY  Donna Moon M Donna Moon 02/29/2016, 7:27 AM Contact # [phone]

## 2016-02-29 NOTE — Anesthesia Postprocedure Evaluation (Signed)
Anesthesia Post Note  Patient: Donna Moon  Procedure(s) Performed: Procedure(s) (LRB): REVERSE SHOULDER ARTHROPLASTY (Left)  Patient location during evaluation: PACU Anesthesia Type: General and Regional Level of consciousness: awake and alert Pain management: pain level controlled Vital Signs Assessment: post-procedure vital signs reviewed and stable Respiratory status: spontaneous breathing, nonlabored ventilation, respiratory function stable and patient connected to nasal cannula oxygen Cardiovascular status: blood pressure returned to baseline and stable Postop Assessment: no signs of nausea or vomiting Anesthetic complications: no    Last Vitals:  Vitals:   02/29/16 1117 02/29/16 1135  BP: 112/63 121/69  Pulse: 60 61  Resp: 17 18  Temp: 36.2 C 36.3 C    Last Pain:  Vitals:   02/29/16 1105  TempSrc:   PainSc: 0-No pain                 [name]

## 2016-02-29 NOTE — Transfer of Care (Signed)
Immediate Anesthesia Transfer of Care Note  Patient: Donna Moon  Procedure(s) Performed: Procedure(s): REVERSE SHOULDER ARTHROPLASTY (Left)  Patient Location: PACU  Anesthesia Type:General and Regional  Level of Consciousness: awake, alert  and patient cooperative  Airway & Oxygen Therapy: Patient Spontanous Breathing and Patient connected to face mask oxygen  Post-op Assessment: Report given to RN, Post -op Vital signs reviewed and stable, Patient moving all extremities X 4 and Patient able to stick tongue midline  Post vital signs: Reviewed and stable  Last Vitals:  Vitals:   02/29/16 0557 02/29/16 0936  BP: (!) 156/72 131/65  Pulse: (!) 58 66  Resp: 20 18  Temp: 37.2 C 36.8 C    Last Pain:  Vitals:   02/29/16 0936  TempSrc:   PainSc: (P) 0-No pain         Complications: No apparent anesthesia complications

## 2016-02-29 NOTE — Op Note (Signed)
02/29/2016  9:27 AM  PATIENT:   Cyndie Chime  65 y.o. female  PRE-OPERATIVE DIAGNOSIS:  LEFT SHOULDER ROTATOR CUFF ARTHROPATHY  POST-OPERATIVE DIAGNOSIS:  same  PROCEDURE:  L RSA #6 stem, +3 poly, 36/+4 glenosphere  SURGEON:  Dayven Linsley, Metta Clines M.D.  ASSISTANTS: Shuford pac   ANESTHESIA:   GET + ISB  EBL: 150  SPECIMEN:  none  Drains: none   PATIENT DISPOSITION:  PACU - hemodynamically stable.    PLAN OF CARE: Admit for overnight observation  Dictation# [id number]   Contact # [phone]

## 2016-02-29 NOTE — Anesthesia Procedure Notes (Addendum)
Anesthesia Regional Block:  Interscalene brachial plexus block  Pre-Anesthetic Checklist: ,, timeout performed, Correct Patient, Correct Site, Correct Laterality, Correct Procedure, Correct Position, site marked, Risks and benefits discussed,  Surgical consent,  Pre-op evaluation,  At surgeon's request and post-op pain management  Laterality: Left  Prep: chloraprep       Needles:  Injection technique: Single-shot  Needle Type: Echogenic Needle     Needle Length: 9cm 9 cm Needle Gauge: 21 and 21 G    Additional Needles:  Procedures: ultrasound guided (picture in chart) Interscalene brachial plexus block Narrative:  Start time: 02/29/2016 7:10 AM End time: 02/29/2016 7:15 AM Injection made incrementally with aspirations every 5 mL.  Performed by: Personally  Anesthesiologist: Suella Broad D  Additional Notes: No immediate complications noted. Pt tolerated well.

## 2016-03-01 MED ORDER — ONDANSETRON HCL 4 MG PO TABS: 4.0000 mg | ORAL_TABLET | Freq: Three times a day (TID) | ORAL | 0 refills | Status: DC | PRN

## 2016-03-01 MED ORDER — OXYCODONE-ACETAMINOPHEN 5-325 MG PO TABS: 1.0000 | ORAL_TABLET | ORAL | 0 refills | Status: DC | PRN

## 2016-03-01 NOTE — Evaluation (Signed)
Occupational Therapy Evaluation and Discharge Patient Details Name: Donna Moon MRN: [id number] DOB: 1950-04-14 Today's Date: 03/01/2016    History of Present Illness Pt is a 65 y.o. female s/p L reverse TSA. PMHx: Arthritis, A-fib, Cancer, Obesity, Osteoporosis, R rotator cuff repair x2, R THA, L TKA.   Clinical Impression   Pr reports she was independent with ADL PTA. Currently pt supervision for functional mobility and min assist for ADL. All shoulder, safety, and ADL education completed. Husband able to demo assisting pt with dressing and LUE exercises as needed. Pt planning to d/c home with 24/7 assist from her husband. Pt ready to d/c home from OT standpoint. No further acute OT needs identified; signing off at this time. Please re-consult if needs change. Thank you for this referral.    Follow Up Recommendations  Supervision/Assistance - 24 hour;Other (comment) (follow up per MD)    Equipment Recommendations  None recommended by OT    Recommendations for Other Services       Precautions / Restrictions Precautions Precautions: Fall;Shoulder Type of Shoulder Precautions: Active protocol: AROM elbow/wrist/hand OK. A/PROM FF90, ABD 60. No resisted IR. Shoulder Interventions: Shoulder sling/immobilizer;At all times;Off for dressing/bathing/exercises (Can remove in controlled environment) Precaution Booklet Issued: Yes (comment) Precaution Comments: Educated pt and husband on shoulder precautions. Required Braces or Orthoses: Sling Restrictions Weight Bearing Restrictions: Yes LUE Weight Bearing: Non weight bearing      Mobility Bed Mobility Overal bed mobility: Needs Assistance Bed Mobility: Supine to Sit     Supine to sit: Supervision     General bed mobility comments: HOB flat with use of bed rail. Pt able to maintain NWB on LUE.  Transfers Overall transfer level: Needs assistance Equipment used: None Transfers: Sit to/from Stand Sit to Stand: Supervision          General transfer comment: Supervision for safety; no physical assist required.    Balance Overall balance assessment: Needs assistance Sitting-balance support: Feet supported;No upper extremity supported Sitting balance-Leahy Scale: Good     Standing balance support: No upper extremity supported;During functional activity Standing balance-Leahy Scale: Good                              ADL Overall ADL's : Needs assistance/impaired Eating/Feeding: Independent;Sitting   Grooming: Supervision/safety;Standing;Wash/dry hands   Upper Body Bathing: Minimal assistance;Sitting;Standing Upper Body Bathing Details (indicate cue type and reason): Educated pt and husband on proper technique for UB bathing. Lower Body Bathing: Minimal assistance;Sit to/from stand   Upper Body Dressing : Minimal assistance;Standing Upper Body Dressing Details (indicate cue type and reason): Educated pt on proper UB dressing technique. Husband able to demo assist for donning bra and shirt. Pt and husband able to doff/don sling properly. Lower Body Dressing: Minimal assistance;Sit to/from stand Lower Body Dressing Details (indicate cue type and reason): Assist to pull up underwear and pants in standing. Husband able to demo assist. Toilet Transfer: Supervision/safety;Ambulation;BSC   Toileting- Water quality scientist and Hygiene: Supervision/safety;Sit to/from stand       Functional mobility during ADLs: Supervision/safety General ADL Comments: Educated pt and husband on proper positioning of LUE in bed and chair, sling management and wear schedule, ice for edema and pain, LUE exercises 3x per day, proper bed mobility technique, LUE NWB status. Pt able to perform stairs with supervision for safety for entry to house.     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 LUE Deficits / Details: AROM elbow/wrist/hand WFL. Limited shoulder ROM. LUE: Unable to fully assess due to immobilization   Lower Extremity Assessment Lower Extremity Assessment: Overall WFL for tasks assessed   Cervical / Trunk Assessment Cervical / Trunk Assessment: Other exceptions Cervical / Trunk Exceptions: body habitus   Communication Communication Communication: No difficulties   Cognition Arousal/Alertness: Awake/alert Behavior During Therapy: WFL for tasks assessed/performed Overall Cognitive Status: Within Functional Limits for tasks assessed                     General Comments       Exercises Exercises: Shoulder (see shoulder exercises section in flowsheets)     Shoulder Instructions Shoulder Instructions Donning/doffing shirt without moving shoulder: Minimal assistance;Caregiver independent with task;Patient able to independently direct caregiver Method for sponge bathing under operated UE: Minimal assistance;Caregiver independent with task;Patient able to independently direct caregiver Donning/doffing sling/immobilizer: Moderate assistance;Caregiver independent with task;Patient able to independently direct caregiver Correct positioning of sling/immobilizer: Supervision/safety;Caregiver independent with task;Patient able to independently direct caregiver ROM for elbow, wrist and digits of operated UE: Supervision/safety;Caregiver independent with task;Patient able to independently direct caregiver Sling wearing schedule (on at all times/off for ADL's): Supervision/safety;Caregiver independent with task;Patient able to  independently direct caregiver Proper positioning of operated UE when showering: Supervision/safety;Caregiver independent with task;Patient able to independently direct caregiver Positioning of UE while sleeping: Minimal assistance;Caregiver independent with task;Patient able to independently direct caregiver    Home Living Family/patient expects to be discharged to:: Private residence Living Arrangements: Spouse/significant other Available Help at Discharge: Family;Available 24 hours/day Type of Home: House Home Access: Stairs to enter [employer] of Steps: 3 Entrance Stairs-Rails: Left Home Layout: One level     Bathroom Shower/Tub: Occupational psychologist: Handicapped height     Home Equipment: Environmental consultant - 2 wheels;Cane - single point;Shower seat - built in;Grab bars - toilet;Grab bars - tub/shower;Adaptive equipment Adaptive Equipment: Reacher        Prior Functioning/Environment Level of Independence: Independent                 OT Problem List:     OT Treatment/Interventions:      OT Goals(Current goals can be found in the care plan section) Acute Rehab OT Goals Patient Stated Goal: get breakfast then go home OT Goal Formulation: All assessment and education complete, DC therapy  OT Frequency:     Barriers to D/C:            Co-evaluation              End of Session Equipment Utilized During Treatment: Other (comment) (sling) Nurse Communication: Mobility status;Other (comment) (no equipment or f/u needs)  Activity Tolerance: Patient tolerated treatment well Patient left: in chair;with call bell/phone within reach;with family/visitor present   Time: [id number] OT Time Calculation (min): 33 min Charges:  OT General Charges $OT Visit: 1 Procedure OT Evaluation $OT Eval Moderate Complexity: 1 Procedure OT Treatments $Self Care/Home Management : 8-22 mins G-Codes:     Binnie Kand M.S., OTR/L Pager: [phone]  03/01/2016,  9:03 AM

## 2016-03-01 NOTE — Progress Notes (Signed)
Pt doing well. Pt and husband given D/C instructions with Rx's, verbal understanding was provided. Pt's incision is clean and dry with no sign of infection. Pt's IV was removed prior to D/C. Pt D/C'd home via wheelchair @ 0930 per MD order. Holli Humbles, RN

## 2016-03-01 NOTE — Op Note (Signed)
NAMERAYYAN, CUE NO.:  [id number]  MEDICAL RECORD NO.:  [id number]  LOCATION:  [facility]                         FACILITY:  [facility]  PHYSICIAN:  Metta Clines. Jon Kasparek, M.D.  DATE OF BIRTH:  October 11, 1950  DATE OF PROCEDURE:  02/29/2016 DATE OF DISCHARGE:                              OPERATIVE REPORT   PREOPERATIVE DIAGNOSIS:  End-stage left shoulder rotator cuff tear arthropathy.  POSTOPERATIVE DIAGNOSIS:  End-stage left shoulder rotator cuff tear arthropathy.  PROCEDURES:  Left reverse shoulder arthroplasty utilizing a press-fit size 6 Arthrex stem with a +3 polyethylene insert and a 36+ 4 glenosphere.  SURGEON:  Metta Clines. Roby Spalla, M.D.  Terrence DupontOlivia Mackie A. Shuford, P.A.-C.  ANESTHESIA:  General endotracheal as well as interscalene block.  ESTIMATED BLOOD LOSS:  150 mL.  DRAINS:  None.  HISTORY:  Ms. Astudillo is a 65 year old female, who has had chronic and progressive increasing left shoulder pain related to end-stage rotator cuff tear arthropathy, left shoulder.  She is now to the point, where she is having a significant impact on her quality of life and ability to perform activities of daily living.  She is now brought to the operating room for planned left shoulder reverse arthroplasty.  Preoperatively, I counseled Ms. Zogg regarding treatment options, potential risks versus benefits thereof.  Possible surgical complications were reviewed including bleeding, infection, neurovascular injury, persistent pain, loss of motion, anesthetic complication, failure of the implant, and possible need for additional surgery.  She understands and accepts and agrees with our planned procedure.  PROCEDURE IN DETAIL:  After undergoing routine preop evaluation, the patient received prophylactic antibiotics.  An interscalene block was established in the holding area by the Anesthesia Department.  Placed supine on the operating table, underwent smooth induction of a  general endotracheal anesthesia.  Placed in the beach-chair position and appropriately padded and protected.  The left shoulder girdle region was sterilely prepped and draped in a standard fashion.  Time-out was called.  An anterior deltopectoral approach to the left shoulder was made through an 8 cm incision.  Skin flaps were elevated.  An electrocautery was used for hemostasis.  Dissection was carried deeply, identified the deltopectoral interval with the vein taken laterally with the deltoid.  This was developed from proximal to distal with blunt dissection and the upper centimeter of the pec major was tenotomized to enhance exposure.  We divided adhesions beneath the deltoid, placed a Brown retractor and then mobilized the conjoined tendon, which retracted medially with self-retaining retractors.  Then, previous rupture long head biceps tendon and evacuated large amount of fluid from the joint space, which exited superiorly to the large defect in the rotator cuff. We then separated the remnant of the subscapularis from the anterior aspect of the humeral head and lesser tuberosity and tagged this with some #2 FiberWires.  We then divided the capsular tissues from the anterior, inferior, and inferior aspects of the humeral neck allowing delivery of the head through the wound.  We outlined our proposed humeral head resection.  With the extramedullary guide, I performed the humeral head resection with an oscillating saw at approximately 10 degrees of retroversion.  A  metal cap was placed over the cut surface of the proximal humerus and we then exposed the glenoid circumferentially with Doreatha Martin, pitchfork, and snake tongue retractors.  We performed a circumferential labral resection, gaining complete visualization of the glenoid.  Placed a guide pin into the center of the glenoid.  It was then reamed with our starting reamer and then used the small glenoid reamer to obtain appropriate bony  bed for baseplate.  Residual soft tissue debris was then carefully removed.  We then impacted the small baseplate onto the glenoid, placed our central lag screw and the inferior and superior locking screws all of which obtained excellent bony fit and fixation.  At this point, the 36+ 4 glenosphere was placed onto the baseplate and impacted, and I should mention we did resect an additional 2 mm of bone from the metaphysis of the proximal humerus to gain appropriate exposure and soft tissue balance to allow placement of our head.  Once the glenosphere was then impacted, we returned our attention to the proximal humerus.  We prepared the canal with hand reaming up to the size 7, broached up to size a 6, which showed excellent fit and fixation.  We then reamed the metaphysis with the +2 offset and then performed a trial reduction, which showed excellent soft tissue balance and good stability.  At this point, we assembled the final implant on the back table.  It was then impacted into the proximal humerus with excellent bony interference fit and fixation.  We then performed trial reduction once again, ultimately found that the +3 polyethylene insert gave [place] the best soft tissue balance.  Trial was removed.  The final +3 poly was then impacted.  Final reduction was performed.  We copiously irrigated the joint, obtained hemostasis, confirmed good soft tissue balance with excellent stability and good motion of the shoulder.  I then repaired the subscapularis back to the margin of the implant anteriorly through the eyelets on the metaphysis of the implant with a #2 FiberWire.  The deltopectoral interval was then reapproximated with series of figure-of-eight #1 Vicryl sutures.  2-0 Monocryl used for subcu layer, intracuticular 3-0 Monocryl for the skin followed by Dermabond, Aquacel dressing.  Left arm was placed into a sling, and the patient was then awakened, extubated, and taken to the recovery  room in stable condition.  Jenetta Loges, PA-C was used as an Environmental consultant throughout this case, essential for help with positioning the patient, positioning the extremity, management of the tissue retractors, implantation of the prosthesis, wound closure, and intraoperative decision making.     Metta Clines. Kately Graffam, M.D.     KMS/MEDQ  D:  02/29/2016  T:  03/01/2016  Job:  [id number]

## 2016-03-01 NOTE — Discharge Summary (Signed)
PATIENT ID:      Donna Moon  MRN:     [id number] DOB/AGE:    65-Jan-1952 / 65 y.o.     DISCHARGE SUMMARY  ADMISSION DATE:    02/29/2016 DISCHARGE DATE:    ADMISSION DIAGNOSIS: LEFT SHOULDER ROTATOR CUFF ARTHROPATHY Past Medical History:  Diagnosis Date  . Arthritis   . Asthma   . Atrial fibrillation ([place])   . Blood transfusion without reported diagnosis    as a child--age 65  . Cancer [facility])    Mohs surgery for skin cancer on right side of nose   . Dizziness   . Dyspareunia   . Dysrhythmia    atrial fib  . Hematuria    hx of has been worked up and no problems   . Hx of seasonal allergies    tx. Allegra, Flonase  . Obesity   . Osteoporosis   . Pneumonia    hx of   . PONV (postoperative nausea and vomiting)    however no problems with past surgeries here.    DISCHARGE DIAGNOSIS:   Active Problems:   S/P reverse total shoulder arthroplasty, left   PROCEDURE: Procedure(s): REVERSE SHOULDER ARTHROPLASTY on 02/29/2016  CONSULTS:    HISTORY:  See H&P in chart.  HOSPITAL COURSE:  Donna Moon is a 65 y.o. admitted on 02/29/2016 with a diagnosis of LEFT SHOULDER ROTATOR CUFF ARTHROPATHY.  They were brought to the operating room on 02/29/2016 and underwent Procedure(s): [place].    They were given perioperative antibiotics: Anti-infectives    Start     Dose/Rate Route Frequency Ordered Stop   02/29/16 1400  ceFAZolin (ANCEF) IVPB 2g/100 mL premix     2 g 200 mL/hr over 30 Minutes Intravenous Every 6 hours 02/29/16 1141 03/01/16 0149   02/29/16 0600  ceFAZolin (ANCEF) IVPB 2g/100 mL premix     2 g 200 mL/hr over 30 Minutes Intravenous On call to O.R. 02/28/16 1446 02/29/16 0749    .  Patient underwent the above named procedure and tolerated it well. The following day they were hemodynamically stable and pain was controlled on oral analgesics. They were neurovascularly intact to the operative extremity. OT was ordered and worked with patient per  protocol. They were medically and orthopaedically stable for discharge on day 1 .    DIAGNOSTIC STUDIES:  RECENT RADIOGRAPHIC STUDIES :  No results found.  RECENT VITAL SIGNS:  Patient Vitals for the past 24 hrs:  BP Temp Temp src Pulse Resp SpO2  03/01/16 0746 (!) 109/59 98.3 F (36.8 C) Oral 61 18 96 %  03/01/16 0400 (!) 106/56 98.3 F (36.8 C) Oral 66 18 96 %  02/29/16 2355 140/70 97.7 F (36.5 C) Oral 73 20 97 %  02/29/16 2000 (!) 117/54 98.5 F (36.9 C) Oral 72 18 95 %  02/29/16 1755 (!) 103/56 98 F (36.7 C) - 69 18 98 %  02/29/16 1135 121/69 97.3 F (36.3 C) - 61 18 94 %  02/29/16 1117 112/63 97.2 F (36.2 C) - 60 17 97 %  02/29/16 1105 (!) 106/59 - - (!) 57 15 96 %  02/29/16 1050 (!) 108/58 - - (!) 57 16 95 %  02/29/16 1035 108/65 - - (!) 56 13 97 %  02/29/16 1020 (!) 114/50 - - (!) 59 16 97 %  02/29/16 1005 103/66 - - (!) 59 13 95 %  02/29/16 0950 (!) 113/52 - - 60 14 97 %  02/29/16 0936 131/65  98.2 F (36.8 C) - 66 18 98 %  .  RECENT EKG RESULTS:    Orders placed or performed in visit on 04/19/15  . EKG 12-Lead    DISCHARGE INSTRUCTIONS:  Discharge Instructions    Discontinue IV    Complete by:  As directed       DISCHARGE MEDICATIONS:   Allergies as of 03/01/2016      Reactions   Lexapro [escitalopram Oxalate] Other (See Comments)   Flu like symptoms   Xarelto [rivaroxaban] Itching      Medication List    TAKE these medications   acetaminophen 500 MG tablet Commonly known as:  TYLENOL Take 1,000 mg by mouth daily as needed for moderate pain or headache.   albuterol 108 (90 Base) MCG/ACT inhaler Commonly known as:  PROVENTIL HFA;VENTOLIN HFA Inhale 2 puffs into the lungs every 6 (six) hours as needed for wheezing.   aspirin 325 MG tablet Take 325 mg by mouth daily.   CALCIUM+D3 PO Take 600 mg by mouth daily.   fluticasone 50 MCG/ACT nasal spray Commonly known as:  FLONASE Place 2 sprays into both nostrils daily.   lactobacillus  acidophilus Tabs tablet Take 1 tablet by mouth daily.   lansoprazole 30 MG capsule Commonly known as:  PREVACID Take 30 mg by mouth daily.   loratadine 10 MG tablet Commonly known as:  CLARITIN Take 10 mg by mouth at bedtime.   metoprolol tartrate 25 MG tablet Commonly known as:  LOPRESSOR Take 1 tablet by mouth in morning, take 2 tablets by mouth in evening.   ondansetron 4 MG tablet Commonly known as:  ZOFRAN Take 1 tablet (4 mg total) by mouth every 8 (eight) hours as needed for nausea or vomiting.   oxyCODONE-acetaminophen 5-325 MG tablet Commonly known as:  PERCOCET Take 1-2 tablets by mouth every 4 (four) hours as needed.   propafenone 150 MG tablet Commonly known as:  RYTHMOL TAKE  (1)  TABLET  EVERY EIGHT HOURS.       FOLLOW UP VISIT:   Follow-up Information    Metta Clines SUPPLE, MD.   Specialty:  Orthopedic Surgery Why:  call to be seen in 10-14 days Contact information: [address] [place] [address] [id number]           DISCHARGE TO: Home  DISPOSITION: Good  DISCHARGE CONDITION:  Festus Barren for Dr. Justice Britain 03/01/2016, 7:54 AM

## 2016-03-05 ENCOUNTER — Encounter (HOSPITAL_COMMUNITY): Payer: Self-pay | Admitting: Orthopedic Surgery

## 2016-04-19 ENCOUNTER — Ambulatory Visit (INDEPENDENT_AMBULATORY_CARE_PROVIDER_SITE_OTHER): Payer: Medicare Other | Admitting: Cardiovascular Disease

## 2016-04-19 ENCOUNTER — Encounter: Payer: Self-pay | Admitting: Cardiovascular Disease

## 2016-04-19 VITALS — BP 136/84 | HR 66 | Ht 68.0 in | Wt 247.2 lb

## 2016-04-19 DIAGNOSIS — Z79899 Other long term (current) drug therapy: Secondary | ICD-10-CM

## 2016-04-19 DIAGNOSIS — Z9189 Other specified personal risk factors, not elsewhere classified: Secondary | ICD-10-CM

## 2016-04-19 DIAGNOSIS — Z5181 Encounter for therapeutic drug level monitoring: Secondary | ICD-10-CM

## 2016-04-19 DIAGNOSIS — R0602 Shortness of breath: Secondary | ICD-10-CM | POA: Diagnosis not present

## 2016-04-19 DIAGNOSIS — E669 Obesity, unspecified: Secondary | ICD-10-CM

## 2016-04-19 DIAGNOSIS — I480 Paroxysmal atrial fibrillation: Secondary | ICD-10-CM | POA: Diagnosis not present

## 2016-04-19 MED ORDER — PROPAFENONE HCL 150 MG PO TABS: 150.0000 mg | ORAL_TABLET | Freq: Three times a day (TID) | ORAL | 11 refills | Status: DC

## 2016-04-19 MED ORDER — METOPROLOL TARTRATE 25 MG PO TABS: ORAL_TABLET | ORAL | 11 refills | Status: DC

## 2016-04-19 NOTE — Patient Instructions (Signed)
Medication Instructions: Dr Sallyanne Kuster recommends that you continue on your current medications as directed. Please refer to the Current Medication list given to you today.  Labwork: NONE ORDERED  Testing/Procedures: 1. Exercise Tolerance Test - Your physician has requested that you have an exercise tolerance test. For further information please visit [email or web address]. Please also follow instruction sheet, as given.  Follow-up: Dr Sallyanne Kuster recommends that you schedule a follow-up appointment in 1 year. You will receive a reminder letter in the mail two months in advance. If you don't receive a letter, please call our office to schedule the follow-up appointment.  If you need a refill on your cardiac medications before your next appointment, please call your pharmacy.

## 2016-04-19 NOTE — Progress Notes (Signed)
Patient ID: Donna Moon, female   DOB: September 21, 1950, 66 y.o.   MRN: [id number]    Cardiology Office Note    Date:  04/19/2016   ID:  Donna Moon, DOB 1950-04-06, MRN [id number]  PCP:  Antionette Fairy, PA-C  Cardiologist:   Sanda Klein, MD   Chief Complaint  Patient presents with  . Follow-up    patient has left shoulder replacement last Dcember. no other complains or concerns.    History of Present Illness:  Donna Moon is a 66 y.o. female with a history of paroxysmal atrial fibrillation with 2 previous successful ablation procedures and some residual palpitations presumed to be secondary to recurrent paroxysmal atrial arrhythmia. Symptoms are very well controlled on metoprolol and propafenone. Rarely has palpitations.   She recently had right shoulder replacement and Is undergoing physical therapy. She has started going to the [facility] for exercise. She rides her bike an average of 5 miles a day (uses a stationary bicycle if the weather is bad, otherwise right the [facility] trail), also goes to water aerobics. She denies chest pain, edema, syncope, dizziness, focal neurological events. She does not have a history of stroke/TIA and has low embolic risk profile (CHADSvas 0-1 for female gender only).  She reports some dyspnea, possibly since she has been less active around the time of her shoulder surgery.  Past Medical History:  Diagnosis Date  . Arthritis   . Asthma   . Atrial fibrillation ([place])   . Blood transfusion without reported diagnosis    as a child--age 25  . Cancer [facility])    Mohs surgery for skin cancer on right side of nose   . Dizziness   . Dyspareunia   . Dysrhythmia    atrial fib  . Hematuria    hx of has been worked up and no problems   . Hx of seasonal allergies    tx. Allegra, Flonase  . Obesity   . Osteoporosis   . Pneumonia    hx of   . PONV (postoperative nausea and vomiting)    however no problems with past surgeries here.    Past Surgical History:    Procedure Laterality Date  . APPENDECTOMY  1968  . CARDIAC ELECTROPHYSIOLOGY STUDY AND ABLATION  1997 & 2011   x 2  . Carpel Tunnel Bilateral 1993  . CERVICAL CONE BIOPSY     laparoscopic BSO--endometrioma  . CESAREAN SECTION  1986  . COLONOSCOPY  2009   Dr. Algis Greenhouse [place]  . OOPHORECTOMY  2009  . REVERSE SHOULDER ARTHROPLASTY Left 02/29/2016   Procedure: REVERSE SHOULDER ARTHROPLASTY;  Surgeon: Justice Britain, MD;  Location: [facility];  Service: Orthopedics;  Laterality: Left;  . ROTATOR CUFF REPAIR Right 1999   x2 -'97, 99  . TOTAL HIP ARTHROPLASTY  12/27/2010   x2 replacements, x1 repair.  Marland Kitchen TOTAL HIP ARTHROPLASTY Right 03/08/2015   Procedure: TOTAL RIGHT  HIP ARTHROPLASTY ANTERIOR APPROACH;  Surgeon: Gaynelle Arabian, MD;  Location: [facility];  Service: Orthopedics;  Laterality: Right;  . TOTAL KNEE ARTHROPLASTY Left 12/07/2012   Procedure: LEFT TOTAL KNEE ARTHROPLASTY;  Surgeon: Gearlean Alf, MD;  Location: [facility];  Service: Orthopedics;  Laterality: Left;  . UPPER GASTROINTESTINAL ENDOSCOPY  2009   Shiflett, [place]    Outpatient Medications Prior to Visit  Medication Sig Dispense Refill  . acetaminophen (TYLENOL) 500 MG tablet Take 1,000 mg by mouth daily as needed for moderate pain or headache.    . albuterol (  PROVENTIL HFA;VENTOLIN HFA) 108 (90 BASE) MCG/ACT inhaler Inhale 2 puffs into the lungs every 6 (six) hours as needed for wheezing.    Marland Kitchen aspirin 325 MG tablet Take 325 mg by mouth daily.    . Calcium Carb-Cholecalciferol (CALCIUM+D3 PO) Take 600 mg by mouth daily.    . fluticasone (FLONASE) 50 MCG/ACT nasal spray Place 2 sprays into both nostrils daily.     Marland Kitchen lactobacillus acidophilus (BACID) TABS tablet Take 1 tablet by mouth daily.    . lansoprazole (PREVACID) 30 MG capsule Take 30 mg by mouth daily.    Marland Kitchen loratadine (CLARITIN) 10 MG tablet Take 10 mg by mouth at bedtime.    . metoprolol tartrate (LOPRESSOR) 25 MG tablet Take 1 tablet by mouth in morning, take 2 tablets by mouth in  evening. 90 tablet 11  . propafenone (RYTHMOL) 150 MG tablet TAKE  (1)  TABLET  EVERY EIGHT HOURS. 90 tablet 11  . ondansetron (ZOFRAN) 4 MG tablet Take 1 tablet (4 mg total) by mouth every 8 (eight) hours as needed for nausea or vomiting. 20 tablet 0  . oxyCODONE-acetaminophen (PERCOCET) 5-325 MG tablet Take 1-2 tablets by mouth every 4 (four) hours as needed. 60 tablet 0   No facility-administered medications prior to visit.      Allergies:   Lexapro [escitalopram oxalate] and Xarelto [rivaroxaban]   Social History   Social History  . Marital status: Married    Spouse name: N/A  . Number of children: 1  . Years of education: N/A   Occupational History  . retired    Social History Main Topics  . Smoking status: Former Smoker    Packs/day: 0.50    Years: 9.00    Types: Cigarettes    Quit date: 03/18/1986  . Smokeless tobacco: Never Used  . Alcohol use No  . Drug use: No  . Sexual activity: Yes    Partners: Male    Birth control/ protection: Post-menopausal   Other Topics Concern  . None   Social History Narrative  . None     Family History:  The patient's family history includes Cancer in her sister; Diabetes in her maternal grandmother, paternal grandmother, sister, and sister; Heart attack in her brother and father; Irritable bowel syndrome in her sister; Kidney disease in her mother; Lupus in her sister; Ovarian cancer (age of onset: 33) in her mother; Stroke in her mother; Ulcerative colitis in her mother.   ROS:   Please see the history of present illness.    ROS All other systems reviewed and are negative.   PHYSICAL EXAM:   VS:  BP 136/84   Pulse 66   Ht 5\' 8"  (1.727 m)   Wt 112.1 kg (247 lb 3.2 oz)   LMP 03/18/2001   BMI 37.59 kg/m    GEN: Well nourished, well developed, in no acute distress  HEENT: normal  Neck: no JVD, carotid bruits, or masses Cardiac: RRR; no murmurs, rubs, or gallops,no edema  Respiratory:  clear to auscultation bilaterally,  normal work of breathing GI: soft, nontender, nondistended, + BS MS: no deformity or atrophy  Skin: warm and dry, no rash Neuro:  Alert and Oriented x 3, Strength and sensation are intact Psych: euthymic mood, full affect  Wt Readings from Last 3 Encounters:  04/19/16 112.1 kg (247 lb 3.2 oz)  02/29/16 111.1 kg (245 lb)  02/20/16 111.4 kg (245 lb 9.5 oz)      Studies/Labs Reviewed:   EKG:  EKG  is ordered today.  The ekg ordered today demonstrates sinus rhythm, Narrow QRS 86 ms but with prominent R waves in leads V1 and V2,  nonspecific T changes unchanged from previous tracing, normal QT interval 444 ms  Recent Labs: 02/20/2016: BUN 15; Creatinine, Ser 1.07; Hemoglobin 13.2; Platelets 225; Potassium 4.5; Sodium 139   Lipid Panel    Component Value Date/Time   CHOL 212 (H) 06/17/2014 1414   TRIG 226 (H) 06/17/2014 1414   HDL 61 06/17/2014 1414   CHOLHDL 3.5 06/17/2014 1414   VLDL 45 (H) 06/17/2014 1414   LDLCALC 106 (H) 06/17/2014 1414    ASSESSMENT:    1. Paroxysmal atrial fibrillation (HCC)   2. Obesity (BMI 30-39.9)   3. Encounter for monitoring anti-arrhythmic therapy   4. Long term current use of antiarrhythmic medical therapy   5. At risk for coronary artery disease   6. Shortness of breath      PLAN:  In order of problems listed above:  1. PAT/PAF: No clinical evidence of recurrent sustained atrial fibrillation, palpitations well controlled on propafenone and metoprolol. Once she fully recovers from her hip replacement, ischemic evaluation with a plain treadmill stress test would be beneficial as long as she continues to take a class I antiarrhythmic. After turning 65 her embolic risk score has increased by 1 (score 2 for age and gender only), but true atrial fibrillation has not been documented in many years. We'll not prescribe anticoagulation until we have more firm evidence of recurrent atrial fibrillation or atrial flutter 2. Obesity: She hopes that now that  she is able to exercise she will make some advances with weight loss. 3. Propafenone: Need to periodically monitor for development of coronary disease especially with symptoms of dyspnea and mild ECG abnormalities. Schedule for treadmill stress test    Medication Adjustments/Labs and Tests Ordered: Current medicines are reviewed at length with the patient today.  Concerns regarding medicines are outlined above.  Medication changes, Labs and Tests ordered today are listed in the Patient Instructions below. Patient Instructions  Medication Instructions: Dr Sallyanne Kuster recommends that you continue on your current medications as directed. Please refer to the Current Medication list given to you today.  Labwork: NONE ORDERED  Testing/Procedures: 1. Exercise Tolerance Test - Your physician has requested that you have an exercise tolerance test. For further information please visit [email or web address]. Please also follow instruction sheet, as given.  Follow-up: Dr Sallyanne Kuster recommends that you schedule a follow-up appointment in 1 year. You will receive a reminder letter in the mail two months in advance. If you don't receive a letter, please call our office to schedule the follow-up appointment.  If you need a refill on your cardiac medications before your next appointment, please call your pharmacy.      Signed, Sanda Klein, MD  04/19/2016 12:19 PM    [address], [place], [place]  [address] Phone: [phone]; Fax: [phone]

## 2016-04-25 ENCOUNTER — Telehealth: Payer: Self-pay

## 2016-04-25 NOTE — Telephone Encounter (Signed)
lmtcb  Patient needs to hold Metoprolol on the day of her upcoming stress test (scheduled for 05/15/16).

## 2016-04-29 NOTE — Telephone Encounter (Signed)
Patient returned call. Advised her of medication instructions. Patient verbalized understanding, agreed with plan, and voiced appreciation for phone call.

## 2016-05-10 ENCOUNTER — Telehealth (HOSPITAL_COMMUNITY): Payer: Self-pay

## 2016-05-10 NOTE — Telephone Encounter (Signed)
Encounter complete. 

## 2016-05-15 ENCOUNTER — Encounter (HOSPITAL_COMMUNITY): Payer: Self-pay | Admitting: *Deleted

## 2016-05-15 ENCOUNTER — Ambulatory Visit (HOSPITAL_COMMUNITY)
Admission: RE | Admit: 2016-05-15 | Discharge: 2016-05-15 | Disposition: A | Discharge status: Home / Self Care | Payer: Medicare Other | Source: Ambulatory Visit | Attending: Cardiovascular Disease | Admitting: Cardiovascular Disease

## 2016-05-15 DIAGNOSIS — R0602 Shortness of breath: Secondary | ICD-10-CM

## 2016-05-15 DIAGNOSIS — Z79899 Other long term (current) drug therapy: Secondary | ICD-10-CM | POA: Diagnosis not present

## 2016-05-15 DIAGNOSIS — Z9189 Other specified personal risk factors, not elsewhere classified: Secondary | ICD-10-CM | POA: Insufficient documentation

## 2016-05-15 DIAGNOSIS — Z5181 Encounter for therapeutic drug level monitoring: Secondary | ICD-10-CM | POA: Insufficient documentation

## 2016-05-15 LAB — EXERCISE TOLERANCE TEST
CHL CUP RESTING HR STRESS: 80 {beats}/min
CHL RATE OF PERCEIVED EXERTION: 18
CSEPEW: 6.1 METS
CSEPPHR: 151 {beats}/min
Exercise duration (min): 4 min
Exercise duration (sec): 20 s
MPHR: 155 {beats}/min
Percent HR: 97 %

## 2016-05-15 NOTE — Progress Notes (Signed)
Pt experienced chest tightness during test and recovery. Reviewed by Dr. Sallyanne Kuster  gave ok to leave.

## 2016-05-17 ENCOUNTER — Telehealth: Payer: Self-pay | Admitting: Cardiovascular Disease

## 2016-05-17 NOTE — Telephone Encounter (Signed)
New Message     Please call returning your call about stress test results

## 2016-05-17 NOTE — Telephone Encounter (Signed)
Notes Recorded by Sanda Klein, MD on 05/15/2016 at 3:44 PM EST I am not fully in agreement with the interpretation of this test, but I think I should meet Donna Moon and discuss the results face-to-face. Please schedule for first available nonurgent appointment.  Pt notified Appt scheduled for 07-25-2016 @9  am

## 2016-07-01 NOTE — Progress Notes (Signed)
66 y.o. G31P1001 Married Caucasian female here for annual exam.    No vaginal bleeding.  Some dryness with intercourse.   Had a cardiac stress test that was positive.  Doing medication changes.   Does water aerobics and rides a bike.   Had left shoulder surgery and has frozen right shoulder for 20 years.   Saw rheumatology 2 year ago.  Unable to have BMD due to   PCP:  Neysa Hotter, PA-C  Patient's last menstrual period was 03/18/2001.           Sexually active: Yes.   female The current method of family planning is post menopausal status.    Exercising: Yes.    Water aerobics and gym Smoker:  Former  Health Maintenance: Pap: 06-10-13 Neg:Neg HR HPV;08-07-10 Neg History of abnormal Pap:  No.  She did have a conization but is not sure of the details of this.  MMG: 10-02-15 3D/heterogeneously dense/Neg/BiRads2:[place] Diag.Imaging Colonoscopy:2009 normal in [place]. Saw Dr. Carlean Purl 2016 and was advised next colonoscopy due 2019. BMD: 2016  Result:  Radiologist unable to perform--patient has multiple fractures and several hip/knee replacements. TDaP: 2011 Gardasil:   no HIV: Neg Hep C: Neg Screening Labs:  Hb today: PCP, Urine today: not done   reports that she quit smoking about 30 years ago. Her smoking use included Cigarettes. She has a 4.50 pack-year smoking history. She has never used smokeless tobacco. She reports that she does not drink alcohol or use drugs.  Past Medical History:  Diagnosis Date  . Arthritis   . Asthma   . Atrial fibrillation ([place])   . Blood transfusion without reported diagnosis    as a child--age 8  . Cancer [facility])    Mohs surgery for skin cancer on right side of nose   . Dizziness   . Dyspareunia   . Dysrhythmia    atrial fib  . Hematuria    hx of has been worked up and no problems   . Hx of seasonal allergies    tx. Allegra, Flonase  . Obesity   . Osteoporosis   . Pneumonia    hx of   . PONV (postoperative nausea and vomiting)     however no problems with past surgeries here.    Past Surgical History:  Procedure Laterality Date  . APPENDECTOMY  1968  . CARDIAC ELECTROPHYSIOLOGY STUDY AND ABLATION  1997 & 2011   x 2  . Carpel Tunnel Bilateral 1993  . CERVICAL CONE BIOPSY     laparoscopic BSO--endometrioma  . CESAREAN SECTION  1986  . COLONOSCOPY  2009   Dr. Algis Greenhouse NL  . OOPHORECTOMY  2009  . REVERSE SHOULDER ARTHROPLASTY Left 02/29/2016   Procedure: REVERSE SHOULDER ARTHROPLASTY;  Surgeon: Justice Britain, MD;  Location: [facility];  Service: Orthopedics;  Laterality: Left;  . ROTATOR CUFF REPAIR Right 1999   x2 -'97, 99  . TOTAL HIP ARTHROPLASTY  12/27/2010   x2 replacements, x1 repair.  Marland Kitchen TOTAL HIP ARTHROPLASTY Right 03/08/2015   Procedure: TOTAL RIGHT  HIP ARTHROPLASTY ANTERIOR APPROACH;  Surgeon: Gaynelle Arabian, MD;  [facility];  Service: Orthopedics;  Laterality: Right;  . TOTAL KNEE ARTHROPLASTY Left 12/07/2012   Procedure: LEFT TOTAL KNEE ARTHROPLASTY;  Surgeon: Gearlean Alf, MD;  [facility];  Service: Orthopedics;  Laterality: Left;  . UPPER GASTROINTESTINAL ENDOSCOPY  2009   Shiflett, NL    Current Outpatient Prescriptions  Medication Sig Dispense Refill  . acetaminophen (TYLENOL) 500 MG tablet Take  1,000 mg by mouth daily as needed for moderate pain or headache.    . albuterol (PROVENTIL HFA;VENTOLIN HFA) 108 (90 BASE) MCG/ACT inhaler Inhale 2 puffs into the lungs every 6 (six) hours as needed for wheezing.    Marland Kitchen aspirin 325 MG tablet Take 325 mg by mouth daily.    . Calcium Carb-Cholecalciferol (CALCIUM+D3 PO) Take 600 mg by mouth daily.    . fluticasone (FLONASE) 50 MCG/ACT nasal spray Place 2 sprays into both nostrils daily.     Marland Kitchen lactobacillus acidophilus (BACID) TABS tablet Take 1 tablet by mouth daily.    . lansoprazole (PREVACID) 30 MG capsule Take 30 mg by mouth daily.    Marland Kitchen loratadine (CLARITIN) 10 MG tablet Take 10 mg by mouth at bedtime.    . metoprolol tartrate (LOPRESSOR) 25 MG  tablet Take 1 tablet (25 mg total) by mouth in the morning and take 2 tablets (50 mg total) by mouth in the evening. 90 tablet 11  . propafenone (RYTHMOL) 150 MG tablet Take 1 tablet (150 mg total) by mouth every 8 (eight) hours. 90 tablet 11   No current facility-administered medications for this visit.     Family History  Problem Relation Age of Onset  . Stroke Mother   . Ovarian cancer Mother 31    dec uterine or ovarian ca  . Ulcerative colitis Mother   . Kidney disease Mother   . Heart attack Father   . Heart attack Brother   . Diabetes Sister     AODM  . Diabetes Sister     AODM  . Irritable bowel syndrome Sister   . Cancer Sister     kidney cancer  . Diabetes Maternal Grandmother   . Diabetes Paternal Grandmother   . Lupus Sister     dec age 68 complications from Lupus    ROS:  Pertinent items are noted in HPI.  Otherwise, a comprehensive ROS was negative.  Exam:   BP 138/80 (BP Location: Right Arm, Patient Position: Sitting, Cuff Size: Large)   Pulse 66   Resp 16   Ht 5\' 7"  (1.702 m)   Wt 245 lb 9.6 oz (111.4 kg)   LMP 03/18/2001   BMI 38.47 kg/m     General appearance: alert, cooperative and appears stated age Head: Normocephalic, without obvious abnormality, atraumatic Neck: no adenopathy, supple, symmetrical, trachea midline and thyroid normal to inspection and palpation Lungs: clear to auscultation bilaterally Breasts: normal appearance, no masses or tenderness, No nipple retraction or dimpling, No nipple discharge or bleeding, No axillary or supraclavicular adenopathy Heart: regular rate and rhythm Abdomen: soft, non-tender; no masses, no organomegaly Extremities: extremities normal, atraumatic, no cyanosis or edema Skin: Skin color, texture, turgor normal. No rashes or lesions Lymph nodes: Cervical, supraclavicular, and axillary nodes normal. No abnormal inguinal nodes palpated Neurologic: Grossly normal  Pelvic: External genitalia:  no lesions               Urethra:  normal appearing urethra with no masses, tenderness or lesions              Bartholins and Skenes: normal                 Vagina: normal appearing vagina with normal color and discharge, no lesions              Cervix: no lesions              Pap taken: Yes.   Bimanual Exam:  Uterus:  normal size, contour, position, consistency, mobility, non-tender              Adnexa: no mass, fullness, tenderness              Rectal exam: Yes.  .  Confirms.              Anus:  normal sphincter tone, no lesions  Chaperone was present for exam.  Assessment:   Well woman visit with normal exam. Hx cervical cone biopsy.  Hx lap BSO - endometrioma. Vaginal atrophy.  Hx atrial fibrillation.  Recent positive cardiac stress test.  Plan: Mammogram screening discussed.  She will do this in [place] this summer and have report sent to me.  Recommended self breast awareness. Pap and HR HPV as above. Guidelines for Calcium, Vitamin D, regular exercise program including cardiovascular and weight bearing exercise. Coconut oil for vaginal hydration.  Follow up annually and prn.    After visit summary provided.

## 2016-07-03 ENCOUNTER — Encounter: Payer: Self-pay | Admitting: Obstetrics and Gynecology

## 2016-07-03 ENCOUNTER — Ambulatory Visit (INDEPENDENT_AMBULATORY_CARE_PROVIDER_SITE_OTHER): Payer: Medicare Other | Admitting: Obstetrics and Gynecology

## 2016-07-03 VITALS — BP 138/80 | HR 66 | Resp 16 | Ht 67.0 in | Wt 245.6 lb

## 2016-07-03 DIAGNOSIS — Z01419 Encounter for gynecological examination (general) (routine) without abnormal findings: Secondary | ICD-10-CM

## 2016-07-03 NOTE — Patient Instructions (Signed)

## 2016-07-04 LAB — IPS PAP SMEAR ONLY

## 2016-07-25 ENCOUNTER — Encounter: Payer: Self-pay | Admitting: Cardiovascular Disease

## 2016-07-25 ENCOUNTER — Ambulatory Visit (INDEPENDENT_AMBULATORY_CARE_PROVIDER_SITE_OTHER): Payer: Medicare Other | Admitting: Cardiovascular Disease

## 2016-07-25 VITALS — BP 136/82 | HR 60 | Ht 68.0 in | Wt 246.4 lb

## 2016-07-25 DIAGNOSIS — I480 Paroxysmal atrial fibrillation: Secondary | ICD-10-CM | POA: Diagnosis not present

## 2016-07-25 DIAGNOSIS — Z01812 Encounter for preprocedural laboratory examination: Secondary | ICD-10-CM

## 2016-07-25 DIAGNOSIS — R5383 Other fatigue: Secondary | ICD-10-CM

## 2016-07-25 DIAGNOSIS — Z13220 Encounter for screening for lipoid disorders: Secondary | ICD-10-CM

## 2016-07-25 DIAGNOSIS — E669 Obesity, unspecified: Secondary | ICD-10-CM | POA: Diagnosis not present

## 2016-07-25 DIAGNOSIS — R9439 Abnormal result of other cardiovascular function study: Secondary | ICD-10-CM | POA: Diagnosis not present

## 2016-07-25 DIAGNOSIS — D689 Coagulation defect, unspecified: Secondary | ICD-10-CM | POA: Diagnosis not present

## 2016-07-25 NOTE — Progress Notes (Signed)
Patient ID: Donna Moon, female   DOB: 08/18/1950, 67 y.o.   MRN: [id number]    Cardiology Office Note    Date:  07/25/2016   ID:  Donna Moon, DOB 12-12-1950, MRN [id number]  PCP:  Antionette Fairy, PA-C  Cardiologist:   Sanda Klein, MD   Chief Complaint  Patient presents with  . Follow-up    Discuss stress test results    History of Present Illness:  Donna Moon is a 66 y.o. female with a history of paroxysmal atrial fibrillation with 2 previous successful ablation procedures and some residual palpitations presumed to be secondary to recurrent paroxysmal atrial arrhythmia. Symptoms are very well controlled on metoprolol and propafenone.   Recently she has noticed increasing episodes of atypical chest squeezing that occurs randomly at rest and is relatively brief in duration. She has noticed that she has "slowed down a lot" while doing water aerobics. She also noticed that her performance on the treadmill test in February was substantially lower than previous stress tests (but I cannot locate any older results).  The stress test was interpreted as being abnormal since there was ST segment depression in multiple leads at peak exercise. However the baseline EKG was already quite abnormal with ST segment depression of 1 mm greater in multiple leads. It's really hard to say whether there was any additional change in the ST segments. The baseline ECG was surprising since the echocardiogram performed before that in the office was normal, as is the electrocardiogram that we performed today in the office.  She does not have a history of stroke/TIA and has low embolic risk profile (CHADSvasc 1-2 for age 45 and female gender only).  She has not had any chest squeezing with activity or other anginal complaints, but does have worsened exertional dyspnea (functional class II). She denies syncope, edema and her palpitations are rarely bothersome. She has not had any focal neurological events or bleeding  problems.  Past Medical History:  Diagnosis Date  . Arthritis   . Asthma   . Atrial fibrillation ([place])   . Blood transfusion without reported diagnosis    as a child--age 30  . Cancer [facility])    Mohs surgery for skin cancer on right side of nose   . Dizziness   . Dyspareunia   . Dysrhythmia    atrial fib  . Hematuria    hx of has been worked up and no problems   . Hx of seasonal allergies    tx. Allegra, Flonase  . Obesity   . Osteoporosis   . Pneumonia    hx of   . PONV (postoperative nausea and vomiting)    however no problems with past surgeries here.    Past Surgical History:  Procedure Laterality Date  . APPENDECTOMY  1968  . CARDIAC ELECTROPHYSIOLOGY STUDY AND ABLATION  1997 & 2011   x 2  . Carpel Tunnel Bilateral 1993  . CERVICAL CONE BIOPSY     laparoscopic BSO--endometrioma  . CESAREAN SECTION  1986  . COLONOSCOPY  2009   Dr. Algis Greenhouse NL  . OOPHORECTOMY  2009  . REVERSE SHOULDER ARTHROPLASTY Left 02/29/2016   Procedure: REVERSE SHOULDER ARTHROPLASTY;  Surgeon: Justice Britain, MD;  Location: [facility];  Service: Orthopedics;  Laterality: Left;  . ROTATOR CUFF REPAIR Right 1999   x2 -'97, 99  . TOTAL HIP ARTHROPLASTY  12/27/2010   x2 replacements, x1 repair.  Marland Kitchen TOTAL HIP ARTHROPLASTY Right 03/08/2015   Procedure: TOTAL RIGHT  HIP ARTHROPLASTY ANTERIOR APPROACH;  Surgeon: Gaynelle Arabian, MD;  [facility];  Service: Orthopedics;  Laterality: Right;  . TOTAL KNEE ARTHROPLASTY Left 12/07/2012   Procedure: LEFT TOTAL KNEE ARTHROPLASTY;  Surgeon: Gearlean Alf, MD;  [facility];  Service: Orthopedics;  Laterality: Left;  . UPPER GASTROINTESTINAL ENDOSCOPY  2009   Shiflett, NL    Outpatient Medications Prior to Visit  Medication Sig Dispense Refill  . acetaminophen (TYLENOL) 500 MG tablet Take 1,000 mg by mouth daily as needed for moderate pain or headache.    . albuterol (PROVENTIL HFA;VENTOLIN HFA) 108 (90 BASE) MCG/ACT inhaler Inhale 2 puffs into the lungs  every 6 (six) hours as needed for wheezing.    Marland Kitchen aspirin 325 MG tablet Take 325 mg by mouth daily.    . fluticasone (FLONASE) 50 MCG/ACT nasal spray Place 2 sprays into both nostrils daily.     Marland Kitchen lactobacillus acidophilus (BACID) TABS tablet Take 1 tablet by mouth daily.    . lansoprazole (PREVACID) 30 MG capsule Take 30 mg by mouth daily.    Marland Kitchen loratadine (CLARITIN) 10 MG tablet Take 10 mg by mouth at bedtime.    . metoprolol tartrate (LOPRESSOR) 25 MG tablet Take 1 tablet (25 mg total) by mouth in the morning and take 2 tablets (50 mg total) by mouth in the evening. 90 tablet 11  . propafenone (RYTHMOL) 150 MG tablet Take 1 tablet (150 mg total) by mouth every 8 (eight) hours. 90 tablet 11  . Calcium Carb-Cholecalciferol (CALCIUM+D3 PO) Take 600 mg by mouth daily.     No facility-administered medications prior to visit.      Allergies:   Lexapro [escitalopram oxalate] and Xarelto [rivaroxaban]   Social History   Social History  . Marital status: Married    Spouse name: N/A  . Number of children: 1  . Years of education: N/A   Occupational History  . retired    Social History Main Topics  . Smoking status: Former Smoker    Packs/day: 0.50    Years: 9.00    Types: Cigarettes    Quit date: 03/18/1986  . Smokeless tobacco: Never Used  . Alcohol use No  . Drug use: No  . Sexual activity: Yes    Partners: Male    Birth control/ protection: Post-menopausal   Other Topics Concern  . None   Social History Narrative  . None     Family History:  The patient's family history includes Cancer in her sister; Diabetes in her maternal grandmother, paternal grandmother, sister, and sister; Heart attack in her brother and father; Irritable bowel syndrome in her sister; Kidney disease in her mother; Lupus in her sister; Ovarian cancer (age of onset: 52) in her mother; Stroke in her mother; Ulcerative colitis in her mother.   ROS:   Please see the history of present illness.    ROS All  other systems reviewed and are negative.   PHYSICAL EXAM:   VS:  BP 136/82   Pulse 60   Ht 5\' 8"  (1.727 m)   Wt 111.8 kg (246 lb 6.4 oz)   LMP 03/18/2001   BMI 37.46 kg/m     General: Alert, oriented x3, no distress. Severe obesity Head: no evidence of trauma, PERRL, EOMI, no exophtalmos or lid lag, no myxedema, no xanthelasma; normal ears, nose and oropharynx Neck: normal jugular venous pulsations and no hepatojugular reflux; brisk carotid pulses without delay and no carotid bruits Chest: clear to auscultation, no signs  of consolidation by percussion or palpation, normal fremitus, symmetrical and full respiratory excursions Cardiovascular: normal position and quality of the apical impulse, regular rhythm, normal first and second heart sounds, no murmurs, rubs or gallops Abdomen: no tenderness or distention, no masses by palpation, no abnormal pulsatility or arterial bruits, normal bowel sounds, no hepatosplenomegaly Extremities: no clubbing, cyanosis or edema; 2+ radial, ulnar and brachial pulses bilaterally; 2+ right femoral, posterior tibial and dorsalis pedis pulses; 2+ left femoral, posterior tibial and dorsalis pedis pulses; no subclavian or femoral bruits Neurological: grossly nonfocal   Wt Readings from Last 3 Encounters:  07/25/16 111.8 kg (246 lb 6.4 oz)  07/03/16 111.4 kg (245 lb 9.6 oz)  04/19/16 112.1 kg (247 lb 3.2 oz)      Studies/Labs Reviewed:   EKG:  EKG is ordered today.  The ekg ordered today demonstrates sinus rhythm, Normal tracing other than a rather prominent R wave in V1 which is a chronic finding  Recent Labs: 02/20/2016: BUN 15; Creatinine, Ser 1.07; Hemoglobin 13.2; Platelets 225; Potassium 4.5; Sodium 139   Lipid Panel    Component Value Date/Time   CHOL 212 (H) 06/17/2014 1414   TRIG 226 (H) 06/17/2014 1414   HDL 61 06/17/2014 1414   CHOLHDL 3.5 06/17/2014 1414   VLDL 45 (H) 06/17/2014 1414   LDLCALC 106 (H) 06/17/2014 1414    ASSESSMENT:      1. Abnormal cardiovascular stress test   2. Paroxysmal atrial fibrillation (HCC)   3. Obesity (BMI 30-39.9)   4. Blood clotting disorder (HCC)   5. Other fatigue   6. Pre-procedure lab exam   7. Screening for lipid disorders      PLAN:  In order of problems listed above:   1. Abnormal stress ECG: Interpreted as showing evidence of ischemia. For reasons discussed above I think the test is equivocal. Reviewed ways to clarify the presence or absence of coronary disease using nuclear stress testing, coronary CTA or conventional angiography. I'm concerned that less invasive techniques would also give equivocal results due to body habitus. Will proceed with conventional angiography/left heart catheterization, via radial approach. Reduce aspirin to 81 mg daily. This procedure has been fully reviewed with the patient and informed consent has been obtained. 2. PAT/PAF: No clinical evidence of recurrent sustained atrial fibrillation As her last ablation, palpitations well controlled on propafenone and metoprolol. She is taking class I antiarrhythmic with good results, but this imposes the need for clarification as to the diagnosis of ischemia.. After turning 65 her embolic risk score has increased by 1 (score 2 for age and gender only), but true atrial fibrillation has not been documented in many years. We'll not prescribe anticoagulation until we have more firm evidence of recurrent atrial fibrillation or atrial flutter 3. Obesity: If coronary insufficiency is not identified, this might be implicated as the major cause for her reduced exertional capacity.. 4. Propafenone: Need to periodically monitor for development of coronary disease especially with symptoms of dyspnea and mild ECG abnormalities.       Medication Adjustments/Labs and Tests Ordered: Current medicines are reviewed at length with the patient today.  Concerns regarding medicines are outlined above.  Medication changes, Labs and  Tests ordered today are listed in the Patient Instructions below. Patient Instructions  Dr Sallyanne Kuster has recommended making the following medication changes: 1. DECREASE Aspirin to 81 mg daily    [facility] [address] [place] [address] [place] [address] Dept:  [phone] Loc: [phone]  JATAYA WANN  07/25/2016  You are scheduled for a Cardiac Catheterization on Monday, May 14 with Dr. Peter Martinique.  1. Please arrive at the [facility] (Main Entrance A) at [facility]: [address] [place], [place] [address] at 5:30 AM (two hours before your procedure to ensure your preparation). Free valet parking service is available.   Special note: Every effort is made to have your procedure done on time. Please understand that emergencies sometimes delay scheduled procedures.  2. Diet: Do not eat or drink anything after midnight prior to your procedure except sips of water to take medications.  3. Labs: You will need to have blood drawn on Thursday, May 10 at [place], [place]  Open: [place] (Lunch 12:30 - 1:30)   Phone: [phone]. You do not need to be fasting.  4. Medication instructions in preparation for your procedure: On the morning of your procedure, take your Aspirin and any morning medicines.  You may use sips of water.  5. Plan for one night stay--bring personal belongings. 6. Bring a current list of your medications and current insurance cards. 7. You MUST have a responsible person to drive you home. 8. Someone MUST be with you the first 24 hours after you arrive home or your discharge will be delayed. 9. Please wear clothes that are easy to get on and off and wear slip-on shoes.  Thank you for allowing [place] to care for you!   -- [facility] Invasive Cardiovascular services      Signed, Sanda Klein, MD  07/25/2016 9:37 AM    [place], [place], [place]  [address] Phone: [phone]; Fax: [phone]

## 2016-07-25 NOTE — Patient Instructions (Signed)
Dr Donna Moon has recommended making the following medication changes: 1. DECREASE Aspirin to 81 mg daily    Donna [address] [place] Moon Dept: [phone] Loc: [phone]  Donna Moon  You are scheduled for a Cardiac Catheterization on Monday, May 14 with Dr. Peter Moon.  1. Please arrive at the [facility] (Main Entrance A) at [facility]: [address] [place], [place] [address] at 5:30 AM (two hours before your procedure to ensure your preparation). Free valet parking service is available.   Special note: Every effort is made to have your procedure done on time. Please understand that emergencies sometimes delay scheduled procedures.  2. Diet: Do not eat or drink anything after midnight prior to your procedure except sips of water to take medications.  3. Labs: You will need to have blood drawn on Thursday, May 10 at [place], [place]  Open: [place] (Lunch 12:30 - 1:30)   Phone: [phone]. You do not need to be fasting.  4. Medication instructions in preparation for your procedure: On the morning of your procedure, take your Aspirin and any morning medicines.  You may use sips of water.  5. Plan for one night stay--bring personal belongings. 6. Bring a current list of your medications and current insurance cards. 7. You MUST have a responsible person to drive you home. 8. Someone MUST be with you the first 24 hours after you arrive home or your discharge will be delayed. 9. Please wear clothes that are easy to get on and off and wear slip-on shoes.  Thank you for allowing [place] to care for you!   -- [place] In[facility] Cardi[facility] services

## 2016-07-26 ENCOUNTER — Other Ambulatory Visit: Payer: Self-pay | Admitting: Cardiovascular Disease

## 2016-07-26 DIAGNOSIS — R9439 Abnormal result of other cardiovascular function study: Secondary | ICD-10-CM

## 2016-07-26 LAB — CBC
HCT: 39 % (ref 35.0–45.0)
HEMOGLOBIN: 12.9 g/dL (ref 11.7–15.5)
MCH: 30 pg (ref 27.0–33.0)
MCHC: 33.1 g/dL (ref 32.0–36.0)
MCV: 90.7 fL (ref 80.0–100.0)
MPV: 8.7 fL (ref 7.5–12.5)
PLATELETS: 227 10*3/uL (ref 140–400)
RBC: 4.3 MIL/uL (ref 3.80–5.10)
RDW: 15.1 % — ABNORMAL HIGH (ref 11.0–15.0)
WBC: 5.2 10*3/uL (ref 3.8–10.8)

## 2016-07-26 LAB — BASIC METABOLIC PANEL
BUN: 17 mg/dL (ref 7–25)
CO2: 24 mmol/L (ref 20–31)
Calcium: 9.1 mg/dL (ref 8.6–10.4)
Chloride: 108 mmol/L (ref 98–110)
Creat: 1.03 mg/dL — ABNORMAL HIGH (ref 0.50–0.99)
GLUCOSE: 101 mg/dL — AB (ref 65–99)
POTASSIUM: 4.2 mmol/L (ref 3.5–5.3)
SODIUM: 142 mmol/L (ref 135–146)

## 2016-07-26 LAB — LIPID PANEL
Cholesterol: 201 mg/dL — ABNORMAL HIGH (ref <200)
HDL: 65 mg/dL (ref >50)
LDL Cholesterol: 118 mg/dL — ABNORMAL HIGH (ref <100)
Total CHOL/HDL Ratio: 3.1 Ratio (ref <5.0)
Triglycerides: 90 mg/dL (ref <150)
VLDL: 18 mg/dL (ref <30)

## 2016-07-26 LAB — PROTIME-INR
INR: 0.9
PROTHROMBIN TIME: 9.9 s (ref 9.0–11.5)

## 2016-07-26 LAB — APTT: aPTT: 25 s (ref 22–34)

## 2016-07-29 ENCOUNTER — Ambulatory Visit (HOSPITAL_COMMUNITY)
Admission: RE | Admit: 2016-07-29 | Discharge: 2016-07-29 | Disposition: A | Discharge status: Home / Self Care | Payer: Medicare Other | Source: Ambulatory Visit | Attending: Cardiology | Admitting: Cardiology

## 2016-07-29 ENCOUNTER — Encounter (HOSPITAL_COMMUNITY): Payer: Self-pay | Admitting: Cardiology

## 2016-07-29 ENCOUNTER — Encounter (HOSPITAL_COMMUNITY)
Admission: RE | Disposition: A | Discharge status: Home / Self Care | Payer: Self-pay | Source: Ambulatory Visit | Attending: Cardiology

## 2016-07-29 DIAGNOSIS — E669 Obesity, unspecified: Secondary | ICD-10-CM | POA: Insufficient documentation

## 2016-07-29 DIAGNOSIS — R0609 Other forms of dyspnea: Secondary | ICD-10-CM

## 2016-07-29 DIAGNOSIS — Z7982 Long term (current) use of aspirin: Secondary | ICD-10-CM | POA: Insufficient documentation

## 2016-07-29 DIAGNOSIS — R0789 Other chest pain: Secondary | ICD-10-CM | POA: Insufficient documentation

## 2016-07-29 DIAGNOSIS — I480 Paroxysmal atrial fibrillation: Secondary | ICD-10-CM | POA: Insufficient documentation

## 2016-07-29 DIAGNOSIS — D689 Coagulation defect, unspecified: Secondary | ICD-10-CM | POA: Diagnosis not present

## 2016-07-29 DIAGNOSIS — R9439 Abnormal result of other cardiovascular function study: Secondary | ICD-10-CM | POA: Diagnosis not present

## 2016-07-29 DIAGNOSIS — M810 Age-related osteoporosis without current pathological fracture: Secondary | ICD-10-CM | POA: Insufficient documentation

## 2016-07-29 DIAGNOSIS — Z6837 Body mass index (BMI) 37.0-37.9, adult: Secondary | ICD-10-CM | POA: Insufficient documentation

## 2016-07-29 DIAGNOSIS — Z7951 Long term (current) use of inhaled steroids: Secondary | ICD-10-CM | POA: Insufficient documentation

## 2016-07-29 DIAGNOSIS — Z87891 Personal history of nicotine dependence: Secondary | ICD-10-CM | POA: Insufficient documentation

## 2016-07-29 DIAGNOSIS — J45909 Unspecified asthma, uncomplicated: Secondary | ICD-10-CM | POA: Insufficient documentation

## 2016-07-29 DIAGNOSIS — R0600 Dyspnea, unspecified: Secondary | ICD-10-CM | POA: Diagnosis present

## 2016-07-29 HISTORY — PX: LEFT HEART CATH AND CORONARY ANGIOGRAPHY: CATH118249

## 2016-07-29 SURGERY — LEFT HEART CATH AND CORONARY ANGIOGRAPHY
Anesthesia: LOCAL

## 2016-07-29 MED ORDER — IOPAMIDOL (ISOVUE-370) INJECTION 76%
INTRAVENOUS | Status: AC
Start: 2016-07-29 — End: 2016-07-29
  Filled 2016-07-29: qty 50

## 2016-07-29 MED ORDER — SODIUM CHLORIDE 0.9 % WEIGHT BASED INFUSION: 1.0000 mL/kg/h | INTRAVENOUS | Status: AC

## 2016-07-29 MED ORDER — SODIUM CHLORIDE 0.9% FLUSH: 3.0000 mL | Freq: Two times a day (BID) | INTRAVENOUS | Status: DC

## 2016-07-29 MED ORDER — FENTANYL CITRATE (PF) 100 MCG/2ML IJ SOLN
INTRAMUSCULAR | Status: DC | PRN
  Administered 2016-07-29: 25 ug via INTRAVENOUS

## 2016-07-29 MED ORDER — VERAPAMIL HCL 2.5 MG/ML IV SOLN
INTRAVENOUS | Status: AC
  Filled 2016-07-29: qty 2

## 2016-07-29 MED ORDER — SODIUM CHLORIDE 0.9 % WEIGHT BASED INFUSION: 1.0000 mL/kg/h | INTRAVENOUS | Status: DC

## 2016-07-29 MED ORDER — MIDAZOLAM HCL 2 MG/2ML IJ SOLN
INTRAMUSCULAR | Status: AC
  Filled 2016-07-29: qty 2

## 2016-07-29 MED ORDER — SODIUM CHLORIDE 0.9 % IV SOLN: 250.0000 mL | INTRAVENOUS | Status: DC | PRN

## 2016-07-29 MED ORDER — SODIUM CHLORIDE 0.9 % WEIGHT BASED INFUSION
3.0000 mL/kg/h | INTRAVENOUS | Status: AC
  Administered 2016-07-29: 3 mL/kg/h via INTRAVENOUS

## 2016-07-29 MED ORDER — HEPARIN SODIUM (PORCINE) 1000 UNIT/ML IJ SOLN
INTRAMUSCULAR | Status: DC | PRN
  Administered 2016-07-29: 5000 [IU] via INTRAVENOUS

## 2016-07-29 MED ORDER — HEPARIN (PORCINE) IN NACL 2-0.9 UNIT/ML-% IJ SOLN
INTRAMUSCULAR | Status: AC | PRN
  Administered 2016-07-29: 1000 mL via INTRA_ARTERIAL

## 2016-07-29 MED ORDER — LIDOCAINE HCL (PF) 1 % IJ SOLN
INTRAMUSCULAR | Status: AC
  Filled 2016-07-29: qty 30

## 2016-07-29 MED ORDER — SODIUM CHLORIDE 0.9% FLUSH: 3.0000 mL | INTRAVENOUS | Status: DC | PRN

## 2016-07-29 MED ORDER — IOPAMIDOL (ISOVUE-370) INJECTION 76%
INTRAVENOUS | Status: AC
  Filled 2016-07-29: qty 100

## 2016-07-29 MED ORDER — IOPAMIDOL (ISOVUE-370) INJECTION 76%
INTRAVENOUS | Status: DC | PRN
  Administered 2016-07-29: 65 mL via INTRA_ARTERIAL

## 2016-07-29 MED ORDER — HEPARIN SODIUM (PORCINE) 1000 UNIT/ML IJ SOLN
INTRAMUSCULAR | Status: AC
  Filled 2016-07-29: qty 1

## 2016-07-29 MED ORDER — ASPIRIN 81 MG PO CHEW: 81.0000 mg | CHEWABLE_TABLET | ORAL | Status: DC

## 2016-07-29 MED ORDER — HEPARIN (PORCINE) IN NACL 2-0.9 UNIT/ML-% IJ SOLN
INTRAMUSCULAR | Status: AC
  Filled 2016-07-29: qty 1000

## 2016-07-29 MED ORDER — FENTANYL CITRATE (PF) 100 MCG/2ML IJ SOLN
INTRAMUSCULAR | Status: AC
  Filled 2016-07-29: qty 2

## 2016-07-29 MED ORDER — LIDOCAINE HCL (PF) 1 % IJ SOLN
INTRAMUSCULAR | Status: DC | PRN
  Administered 2016-07-29: 2 mL

## 2016-07-29 MED ORDER — VERAPAMIL HCL 2.5 MG/ML IV SOLN
INTRAVENOUS | Status: DC | PRN
  Administered 2016-07-29: 09:00:00 via INTRA_ARTERIAL

## 2016-07-29 MED ORDER — MIDAZOLAM HCL 2 MG/2ML IJ SOLN
INTRAMUSCULAR | Status: DC | PRN
  Administered 2016-07-29: 2 mg via INTRAVENOUS

## 2016-07-29 SURGICAL SUPPLY — 10 items
CATH 5FR JL3.5 JR4 ANG PIG MP (CATHETERS) ×1 IMPLANT
DEVICE RAD COMP TR BAND LRG (VASCULAR PRODUCTS) ×2 IMPLANT
GLIDESHEATH SLEND SS 6F .021 (SHEATH) ×1 IMPLANT
GUIDEWIRE INQWIRE 1.5J.035X260 (WIRE) IMPLANT
INQWIRE 1.5J .035X260CM (WIRE) ×2
KIT HEART LEFT (KITS) ×2 IMPLANT
PACK CARDIAC CATHETERIZATION (CUSTOM PROCEDURE TRAY) ×2 IMPLANT
SYR MEDRAD MARK V 150ML (SYRINGE) ×2 IMPLANT
TRANSDUCER W/STOPCOCK (MISCELLANEOUS) ×2 IMPLANT
TUBING CIL FLEX 10 FLL-RA (TUBING) ×2 IMPLANT

## 2016-07-29 NOTE — Discharge Instructions (Signed)
Radial Site Care °Refer to this sheet in the next few weeks. These instructions provide you with information about caring for yourself after your procedure. Your health care provider may also give you more specific instructions. Your treatment has been planned according to current medical practices, but problems sometimes occur. Call your health care provider if you have any problems or questions after your procedure. °What can I expect after the procedure? °After your procedure, it is typical to have the following: °· Bruising at the radial site that usually fades within 1-2 weeks. °· Blood collecting in the tissue (hematoma) that may be painful to the touch. It should usually decrease in size and tenderness within 1-2 weeks. °Follow these instructions at home: °· Take medicines only as directed by your health care provider. °· You may shower 24-48 hours after the procedure or as directed by your health care provider. Remove the bandage (dressing) and gently wash the site with plain soap and water. Pat the area dry with a clean towel. Do not rub the site, because this may cause bleeding. °· Do not take baths, swim, or use a hot tub until your health care provider approves. °· Check your insertion site every day for redness, swelling, or drainage. °· Do not apply powder or lotion to the site. °· Do not flex or bend the affected arm for 24 hours or as directed by your health care provider. °· Do not push or pull heavy objects with the affected arm for 24 hours or as directed by your health care provider. °· Do not lift over 10 lb (4.5 kg) for 5 days after your procedure or as directed by your health care provider. °· Ask your health care provider when it is okay to: °¨ Return to work or school. °¨ Resume usual physical activities or sports. °¨ Resume sexual activity. °· Do not drive home if you are discharged the same day as the procedure. Have someone else drive you. °· You may drive 24 hours after the procedure  unless otherwise instructed by your health care provider. °· Do not operate machinery or power tools for 24 hours after the procedure. °· If your procedure was done as an outpatient procedure, which means that you went home the same day as your procedure, a responsible adult should be with you for the first 24 hours after you arrive home. °· Keep all follow-up visits as directed by your health care provider. This is important. °Contact a health care provider if: °· You have a fever. °· You have chills. °· You have increased bleeding from the radial site. Hold pressure on the site. °Get help right away if: °· You have unusual pain at the radial site. °· You have redness, warmth, or swelling at the radial site. °· You have drainage (other than a small amount of blood on the dressing) from the radial site. °· The radial site is bleeding, and the bleeding does not stop after 30 minutes of holding steady pressure on the site. °· Your arm or hand becomes pale, cool, tingly, or numb. °This information is not intended to replace advice given to you by your health care provider. Make sure you discuss any questions you have with your health care provider. °Document Released: 04/06/2010 Document Revised: 08/10/2015 Document Reviewed: 09/20/2013 °Elsevier Interactive Patient Education © 2017 [employer] ° °

## 2016-07-29 NOTE — Interval H&P Note (Signed)
History and Physical Interval Note:  07/29/2016 8:49 AM  Donna Moon  has presented today for surgery, with the diagnosis of abnormal stress  The various methods of treatment have been discussed with the patient and family. After consideration of risks, benefits and other options for treatment, the patient has consented to  Procedure(s): Left Heart Cath and Coronary Angiography (N/A) as a surgical intervention .  The patient's history has been reviewed, patient examined, no change in status, stable for surgery.  I have reviewed the patient's chart and labs.  Questions were answered to the patient's satisfaction.   Cath Lab Visit (complete for each Cath Lab visit)  Clinical Evaluation Leading to the Procedure:   ACS: No.  Non-ACS:    Anginal Classification: CCS II  Anti-ischemic medical therapy: Minimal Therapy (1 class of medications)  Non-Invasive Test Results: Intermediate-risk stress test findings: cardiac mortality 1-3%/year  Prior CABG: No previous CABG        [facility] 07/29/2016 8:50 AM

## 2016-07-29 NOTE — Progress Notes (Signed)
And thank you again. Donna Moon

## 2016-07-29 NOTE — H&P (View-Only) (Signed)
Patient ID: Donna Moon, female   DOB: [birth year], 66 y.o.   MRN: [id number]    Cardiology Office Note    Date:  07/25/2016   ID:  Donna Moon, DOB Aug 31, 1950, MRN [id number]  PCP:  Antionette Fairy, PA-C  Cardiologist:   Sanda Klein, MD   Chief Complaint  Patient presents with  . Follow-up    Discuss stress test results    History of Present Illness:  Donna Moon is a 66 y.o. female with a history of paroxysmal atrial fibrillation with 2 previous successful ablation procedures and some residual palpitations presumed to be secondary to recurrent paroxysmal atrial arrhythmia. Symptoms are very well controlled on metoprolol and propafenone.   Recently she has noticed increasing episodes of atypical chest squeezing that occurs randomly at rest and is relatively brief in duration. She has noticed that she has "slowed down a lot" while doing water aerobics. She also noticed that her performance on the treadmill test in February was substantially lower than previous stress tests (but I cannot locate any older results).  The stress test was interpreted as being abnormal since there was ST segment depression in multiple leads at peak exercise. However the baseline EKG was already quite abnormal with ST segment depression of 1 mm greater in multiple leads. It's really hard to say whether there was any additional change in the ST segments. The baseline ECG was surprising since the echocardiogram performed before that in the office was normal, as is the electrocardiogram that we performed today in the office.  She does not have a history of stroke/TIA and has low embolic risk profile (CHADSvasc 1-2 for age 9 and female gender only).  She has not had any chest squeezing with activity or other anginal complaints, but does have worsened exertional dyspnea (functional class II). She denies syncope, edema and her palpitations are rarely bothersome. She has not had any focal neurological events or bleeding  problems.  Past Medical History:  Diagnosis Date  . Arthritis   . Asthma   . Atrial fibrillation ([place])   . Blood transfusion without reported diagnosis    as a child--age 89  . Cancer [facility])    Mohs surgery for skin cancer on right side of nose   . Dizziness   . Dyspareunia   . Dysrhythmia    atrial fib  . Hematuria    hx of has been worked up and no problems   . Hx of seasonal allergies    tx. Allegra, Flonase  . Obesity   . Osteoporosis   . Pneumonia    hx of   . PONV (postoperative nausea and vomiting)    however no problems with past surgeries here.    Past Surgical History:  Procedure Laterality Date  . APPENDECTOMY  1968  . CARDIAC ELECTROPHYSIOLOGY STUDY AND ABLATION  1997 & 2011   x 2  . Carpel Tunnel Bilateral 1993  . CERVICAL CONE BIOPSY     laparoscopic BSO--endometrioma  . CESAREAN SECTION  1986  . COLONOSCOPY  2009   Dr. Algis Greenhouse NL  . OOPHORECTOMY  2009  . REVERSE SHOULDER ARTHROPLASTY Left 02/29/2016   Procedure: REVERSE SHOULDER ARTHROPLASTY;  Surgeon: Justice Britain, MD;  Location: [facility];  Service: Orthopedics;  Laterality: Left;  . ROTATOR CUFF REPAIR Right 1999   x2 -'97, 99  . TOTAL HIP ARTHROPLASTY  12/27/2010   x2 replacements, x1 repair.  Marland Kitchen TOTAL HIP ARTHROPLASTY Right 03/08/2015   Procedure: TOTAL RIGHT  HIP ARTHROPLASTY ANTERIOR APPROACH;  Surgeon: Gaynelle Arabian, MD;  [facility];  Service: Orthopedics;  Laterality: Right;  . TOTAL KNEE ARTHROPLASTY Left 12/07/2012   Procedure: LEFT TOTAL KNEE ARTHROPLASTY;  Surgeon: Gearlean Alf, MD;  [facility];  Service: Orthopedics;  Laterality: Left;  . UPPER GASTROINTESTINAL ENDOSCOPY  2009   Shiflett, NL    Outpatient Medications Prior to Visit  Medication Sig Dispense Refill  . acetaminophen (TYLENOL) 500 MG tablet Take 1,000 mg by mouth daily as needed for moderate pain or headache.    . albuterol (PROVENTIL HFA;VENTOLIN HFA) 108 (90 BASE) MCG/ACT inhaler Inhale 2 puffs into the lungs  every 6 (six) hours as needed for wheezing.    Marland Kitchen aspirin 325 MG tablet Take 325 mg by mouth daily.    . fluticasone (FLONASE) 50 MCG/ACT nasal spray Place 2 sprays into both nostrils daily.     Marland Kitchen lactobacillus acidophilus (BACID) TABS tablet Take 1 tablet by mouth daily.    . lansoprazole (PREVACID) 30 MG capsule Take 30 mg by mouth daily.    Marland Kitchen loratadine (CLARITIN) 10 MG tablet Take 10 mg by mouth at bedtime.    . metoprolol tartrate (LOPRESSOR) 25 MG tablet Take 1 tablet (25 mg total) by mouth in the morning and take 2 tablets (50 mg total) by mouth in the evening. 90 tablet 11  . propafenone (RYTHMOL) 150 MG tablet Take 1 tablet (150 mg total) by mouth every 8 (eight) hours. 90 tablet 11  . Calcium Carb-Cholecalciferol (CALCIUM+D3 PO) Take 600 mg by mouth daily.     No facility-administered medications prior to visit.      Allergies:   Lexapro [escitalopram oxalate] and Xarelto [rivaroxaban]   Social History   Social History  . Marital status: Married    Spouse name: N/A  . Number of children: 1  . Years of education: N/A   Occupational History  . retired    Social History Main Topics  . Smoking status: Former Smoker    Packs/day: 0.50    Years: 9.00    Types: Cigarettes    Quit date: 03/18/1986  . Smokeless tobacco: Never Used  . Alcohol use No  . Drug use: No  . Sexual activity: Yes    Partners: Male    Birth control/ protection: Post-menopausal   Other Topics Concern  . None   Social History Narrative  . None     Family History:  The patient's family history includes Cancer in her sister; Diabetes in her maternal grandmother, paternal grandmother, sister, and sister; Heart attack in her brother and father; Irritable bowel syndrome in her sister; Kidney disease in her mother; Lupus in her sister; Ovarian cancer (age of onset: 62) in her mother; Stroke in her mother; Ulcerative colitis in her mother.   ROS:   Please see the history of present illness.    ROS All  other systems reviewed and are negative.   PHYSICAL EXAM:   VS:  BP 136/82   Pulse 60   Ht 5\' 8"  (1.727 m)   Wt 111.8 kg (246 lb 6.4 oz)   LMP 03/18/2001   BMI 37.46 kg/m     General: Alert, oriented x3, no distress. Severe obesity Head: no evidence of trauma, PERRL, EOMI, no exophtalmos or lid lag, no myxedema, no xanthelasma; normal ears, nose and oropharynx Neck: normal jugular venous pulsations and no hepatojugular reflux; brisk carotid pulses without delay and no carotid bruits Chest: clear to auscultation, no signs  of consolidation by percussion or palpation, normal fremitus, symmetrical and full respiratory excursions Cardiovascular: normal position and quality of the apical impulse, regular rhythm, normal first and second heart sounds, no murmurs, rubs or gallops Abdomen: no tenderness or distention, no masses by palpation, no abnormal pulsatility or arterial bruits, normal bowel sounds, no hepatosplenomegaly Extremities: no clubbing, cyanosis or edema; 2+ radial, ulnar and brachial pulses bilaterally; 2+ right femoral, posterior tibial and dorsalis pedis pulses; 2+ left femoral, posterior tibial and dorsalis pedis pulses; no subclavian or femoral bruits Neurological: grossly nonfocal   Wt Readings from Last 3 Encounters:  07/25/16 111.8 kg (246 lb 6.4 oz)  07/03/16 111.4 kg (245 lb 9.6 oz)  04/19/16 112.1 kg (247 lb 3.2 oz)      Studies/Labs Reviewed:   EKG:  EKG is ordered today.  The ekg ordered today demonstrates sinus rhythm, Normal tracing other than a rather prominent R wave in V1 which is a chronic finding  Recent Labs: 02/20/2016: BUN 15; Creatinine, Ser 1.07; Hemoglobin 13.2; Platelets 225; Potassium 4.5; Sodium 139   Lipid Panel    Component Value Date/Time   CHOL 212 (H) 06/17/2014 1414   TRIG 226 (H) 06/17/2014 1414   HDL 61 06/17/2014 1414   CHOLHDL 3.5 06/17/2014 1414   VLDL 45 (H) 06/17/2014 1414   LDLCALC 106 (H) 06/17/2014 1414    ASSESSMENT:      1. Abnormal cardiovascular stress test   2. Paroxysmal atrial fibrillation (HCC)   3. Obesity (BMI 30-39.9)   4. Blood clotting disorder (HCC)   5. Other fatigue   6. Pre-procedure lab exam   7. Screening for lipid disorders      PLAN:  In order of problems listed above:   1. Abnormal stress ECG: Interpreted as showing evidence of ischemia. For reasons discussed above I think the test is equivocal. Reviewed ways to clarify the presence or absence of coronary disease using nuclear stress testing, coronary CTA or conventional angiography. I'm concerned that less invasive techniques would also give equivocal results due to body habitus. Will proceed with conventional angiography/left heart catheterization, via radial approach. Reduce aspirin to 81 mg daily. This procedure has been fully reviewed with the patient and informed consent has been obtained. 2. PAT/PAF: No clinical evidence of recurrent sustained atrial fibrillation As her last ablation, palpitations well controlled on propafenone and metoprolol. She is taking class I antiarrhythmic with good results, but this imposes the need for clarification as to the diagnosis of ischemia.. After turning 65 her embolic risk score has increased by 1 (score 2 for age and gender only), but true atrial fibrillation has not been documented in many years. We'll not prescribe anticoagulation until we have more firm evidence of recurrent atrial fibrillation or atrial flutter 3. Obesity: If coronary insufficiency is not identified, this might be implicated as the major cause for her reduced exertional capacity.. 4. Propafenone: Need to periodically monitor for development of coronary disease especially with symptoms of dyspnea and mild ECG abnormalities.       Medication Adjustments/Labs and Tests Ordered: Current medicines are reviewed at length with the patient today.  Concerns regarding medicines are outlined above.  Medication changes, Labs and  Tests ordered today are listed in the Patient Instructions below. Patient Instructions  Dr Sallyanne Kuster has recommended making the following medication changes: 1. DECREASE Aspirin to 81 mg daily    [facility] [address] [place] [address] Dept:  [phone] Loc: [phone]  MERLEEN PICAZO  07/25/2016  You are scheduled for a Cardiac Catheterization on Monday, May 14 with Dr. Peter Martinique.  1. Please arrive at the [facility] (Main Entrance A) at [facility]: [address] [place], [place] [address] at 5:30 AM (two hours before your procedure to ensure your preparation). Free valet parking service is available.   Special note: Every effort is made to have your procedure done on time. Please understand that emergencies sometimes delay scheduled procedures.  2. Diet: Do not eat or drink anything after midnight prior to your procedure except sips of water to take medications.  3. Labs: You will need to have blood drawn on Thursday, May 10 at [place], [place]  Open: [place] (Lunch 12:30 - 1:30)   Phone: [phone]. You do not need to be fasting.  4. Medication instructions in preparation for your procedure: On the morning of your procedure, take your Aspirin and any morning medicines.  You may use sips of water.  5. Plan for one night stay--bring personal belongings. 6. Bring a current list of your medications and current insurance cards. 7. You MUST have a responsible person to drive you home. 8. Someone MUST be with you the first 24 hours after you arrive home or your discharge will be delayed. 9. Please wear clothes that are easy to get on and off and wear slip-on shoes.  Thank you for allowing [place] to care for you!   -- [facility] Invasive Cardiovascular services      Signed, Sanda Klein, MD  07/25/2016 9:37 AM    [facility], [place], [place]  [address] Phone: [phone]; Fax: [phone]

## 2016-07-30 ENCOUNTER — Encounter: Payer: Self-pay | Admitting: Cardiovascular Disease

## 2016-12-10 ENCOUNTER — Encounter: Payer: Self-pay | Admitting: Obstetrics and Gynecology

## 2017-04-26 ENCOUNTER — Encounter: Payer: Self-pay | Admitting: Cardiovascular Disease

## 2017-04-28 ENCOUNTER — Other Ambulatory Visit: Payer: Self-pay

## 2017-04-28 MED ORDER — METOPROLOL TARTRATE 25 MG PO TABS: 25.0000 mg | ORAL_TABLET | Freq: Two times a day (BID) | ORAL | 11 refills | Status: DC

## 2017-04-28 MED ORDER — PROPAFENONE HCL 150 MG PO TABS: 150.0000 mg | ORAL_TABLET | Freq: Three times a day (TID) | ORAL | 11 refills | Status: DC

## 2017-05-11 ENCOUNTER — Other Ambulatory Visit: Payer: Self-pay | Admitting: Cardiovascular Disease

## 2017-05-12 ENCOUNTER — Encounter: Payer: Self-pay | Admitting: Cardiovascular Disease

## 2017-05-12 ENCOUNTER — Encounter: Payer: Self-pay | Admitting: *Deleted

## 2017-05-12 ENCOUNTER — Other Ambulatory Visit: Payer: Self-pay | Admitting: *Deleted

## 2017-05-12 MED ORDER — METOPROLOL TARTRATE 25 MG PO TABS: 25.0000 mg | ORAL_TABLET | Freq: Two times a day (BID) | ORAL | 11 refills | Status: DC

## 2017-05-12 NOTE — Telephone Encounter (Signed)
This encounter was created in error - please disregard.

## 2017-05-12 NOTE — Telephone Encounter (Signed)
REFILL 

## 2017-06-06 ENCOUNTER — Encounter: Payer: Self-pay | Admitting: Cardiovascular Disease

## 2017-06-06 ENCOUNTER — Ambulatory Visit: Payer: Medicare Other | Admitting: Cardiovascular Disease

## 2017-06-06 VITALS — BP 124/82 | HR 58 | Ht 68.0 in | Wt 246.6 lb

## 2017-06-06 DIAGNOSIS — I480 Paroxysmal atrial fibrillation: Secondary | ICD-10-CM | POA: Diagnosis not present

## 2017-06-06 DIAGNOSIS — Z6837 Body mass index (BMI) 37.0-37.9, adult: Secondary | ICD-10-CM

## 2017-06-06 MED ORDER — METOPROLOL TARTRATE 25 MG PO TABS: ORAL_TABLET | ORAL | 3 refills | Status: DC

## 2017-06-06 MED ORDER — PROPAFENONE HCL 150 MG PO TABS: 150.0000 mg | ORAL_TABLET | Freq: Three times a day (TID) | ORAL | 3 refills | Status: DC

## 2017-06-06 NOTE — Progress Notes (Signed)
Patient ID: Donna Moon, female   DOB: [birth year], 67 y.o.   MRN: [id number]    Cardiology Office Note    Date:  06/06/2017   ID:  Donna Moon, DOB 04-29-1950, MRN [id number]  PCP:  Joyice Faster, FNP  Cardiologist:   Sanda Klein, MD   No chief complaint on file.   History of Present Illness:  Donna Moon is a 67 y.o. female with a history of paroxysmal atrial fibrillation with 2 previous successful ablation procedures and some residual palpitations presumed to be secondary to recurrent paroxysmal atrial arrhythmia. Symptoms are very well controlled on metoprolol and propafenone.  Abnormal treadmill stress test last year led to coronary angiography, with normal findings.    She has not had chest pain, dyspnea and her palpitations are very infrequent.  She denies edema, claudication, focal neurological complaints or bleeding problems.  Atrial fibrillation has not actually been documented since her last ablation procedure. She does not have a history of stroke/TIA and has low embolic risk profile (CHADSvasc 1-2 for age 11 and female gender only).   Past Medical History:  Diagnosis Date  . Arthritis   . Asthma   . Atrial fibrillation ([place])   . Blood transfusion without reported diagnosis    as a child--age 68  . Cancer [facility])    Mohs surgery for skin cancer on right side of nose   . Dizziness   . Dyspareunia   . Dysrhythmia    atrial fib  . Hematuria    hx of has been worked up and no problems   . Hx of seasonal allergies    tx. Allegra, Flonase  . Obesity   . Osteoporosis   . Pneumonia    hx of   . PONV (postoperative nausea and vomiting)    however no problems with past surgeries here.    Past Surgical History:  Procedure Laterality Date  . APPENDECTOMY  1968  . CARDIAC ELECTROPHYSIOLOGY STUDY AND ABLATION  1997 & 2011   x 2  . Carpel Tunnel Bilateral 1993  . CERVICAL CONE BIOPSY     laparoscopic BSO--endometrioma  . CESAREAN SECTION  1986  . COLONOSCOPY  2009     Dr. Algis Greenhouse NL  . LEFT HEART CATH AND CORONARY ANGIOGRAPHY N/A 07/29/2016   Procedure: Left Heart Cath and Coronary Angiography;  Surgeon: Martinique, Peter M, MD;  Location: [facility];  Service: Cardiovascular;  Laterality: N/A;  . OOPHORECTOMY  2009  . REVERSE SHOULDER ARTHROPLASTY Left 02/29/2016   Procedure: REVERSE SHOULDER ARTHROPLASTY;  Surgeon: Justice Britain, MD;  Location: [facility];  Service: Orthopedics;  Laterality: Left;  . ROTATOR CUFF REPAIR Right 1999   x2 -'97, 99  . TOTAL HIP ARTHROPLASTY  12/27/2010   x2 replacements, x1 repair.  Marland Kitchen TOTAL HIP ARTHROPLASTY Right 03/08/2015   Procedure: TOTAL RIGHT  HIP ARTHROPLASTY ANTERIOR APPROACH;  Surgeon: Gaynelle Arabian, MD;  [facility];  Service: Orthopedics;  Laterality: Right;  . TOTAL KNEE ARTHROPLASTY Left 12/07/2012   Procedure: LEFT TOTAL KNEE ARTHROPLASTY;  Surgeon: Gearlean Alf, MD;  [facility];  Service: Orthopedics;  Laterality: Left;  . UPPER GASTROINTESTINAL ENDOSCOPY  2009   Shiflett, NL    Outpatient Medications Prior to Visit  Medication Sig Dispense Refill  . acetaminophen (TYLENOL) 500 MG tablet Take 1,000 mg by mouth daily as needed for moderate pain or headache.    . albuterol (PROVENTIL HFA;VENTOLIN HFA) 108 (90 BASE) MCG/ACT inhaler Inhale 2  puffs into the lungs every 6 (six) hours as needed for wheezing.    Marland Kitchen albuterol (PROVENTIL) (2.5 MG/3ML) 0.083% nebulizer solution Take 2.5 mg by nebulization every 6 (six) hours as needed for wheezing or shortness of breath.    Marland Kitchen aspirin EC 81 MG tablet Take 81 mg by mouth daily at 6 (six) AM.    . fluticasone (FLONASE) 50 MCG/ACT nasal spray Place 2 sprays into both nostrils daily at 6 (six) AM.     . lansoprazole (PREVACID 24HR) 15 MG capsule Take 15 mg by mouth daily at 6 (six) AM.    . loratadine (CLARITIN) 10 MG tablet Take 10 mg by mouth daily at 6 (six) AM.     . metoprolol tartrate (LOPRESSOR) 25 MG tablet TAKE 1 TABLET BY MOUTH IN THE MORNING AND 2  IN THE EVENING 90 tablet 0  . Probiotic Product (PROBIOTIC PO) Take 1 capsule by mouth daily at 6 (six) AM.    . propafenone (RYTHMOL) 150 MG tablet Take 1 tablet (150 mg total) by mouth every 8 (eight) hours. 90 tablet 11   No facility-administered medications prior to visit.      Allergies:   Lexapro [escitalopram oxalate] and Xarelto [rivaroxaban]   Social History   Socioeconomic History  . Marital status: Married    Spouse name: Not on file  . Number of children: 1  . Years of education: Not on file  . Highest education level: Not on file  Occupational History  . Occupation: retired  Scientific laboratory technician  . Financial resource strain: Not on file  . Food insecurity:    Worry: Not on file    Inability: Not on file  . Transportation needs:    Medical: Not on file    Non-medical: Not on file  Tobacco Use  . Smoking status: Former Smoker    Packs/day: 0.50    Years: 9.00    Pack years: 4.50    Types: Cigarettes    Last attempt to quit: 03/18/1986    Years since quitting: 31.2  . Smokeless tobacco: Never Used  Substance and Sexual Activity  . Alcohol use: No    Alcohol/week: 0.0 oz  . Drug use: No  . Sexual activity: Yes    Partners: Male    Birth control/protection: Post-menopausal  Lifestyle  . Physical activity:    Days per week: Not on file    Minutes per session: Not on file  . Stress: Not on file  Relationships  . Social connections:    Talks on phone: Not on file    Gets together: Not on file    Attends religious service: Not on file    Active member of club or organization: Not on file    Attends meetings of clubs or organizations: Not on file    Relationship status: Not on file  Other Topics Concern  . Not on file  Social History Narrative  . Not on file     Family History:  The patient's family history includes Cancer in her sister; Diabetes in her maternal grandmother, paternal grandmother, sister, and sister; Heart attack in her brother and father;  Irritable bowel syndrome in her sister; Kidney disease in her mother; Lupus in her sister; Ovarian cancer (age of onset: 73) in her mother; Stroke in her mother; Ulcerative colitis in her mother.   ROS:   Please see the history of present illness.    ROS All other systems reviewed and are negative.   PHYSICAL  EXAM:   VS:  BP 124/82   Pulse (!) 58   Ht 5\' 8"  (1.727 m)   Wt 246 lb 9.6 oz (111.9 kg)   LMP 03/18/2001   BMI 37.50 kg/m      General: Alert, oriented x3, no distress, severely obese Head: no evidence of trauma, PERRL, EOMI, no exophtalmos or lid lag, no myxedema, no xanthelasma; normal ears, nose and oropharynx Neck: normal jugular venous pulsations and no hepatojugular reflux; brisk carotid pulses without delay and no carotid bruits Chest: clear to auscultation, no signs of consolidation by percussion or palpation, normal fremitus, symmetrical and full respiratory excursions Cardiovascular: normal position and quality of the apical impulse, regular rhythm, normal first and second heart sounds, no murmurs, rubs or gallops Abdomen: no tenderness or distention, no masses by palpation, no abnormal pulsatility or arterial bruits, normal bowel sounds, no hepatosplenomegaly Extremities: no clubbing, cyanosis or edema; 2+ radial, ulnar and brachial pulses bilaterally; 2+ right femoral, posterior tibial and dorsalis pedis pulses; 2+ left femoral, posterior tibial and dorsalis pedis pulses; no subclavian or femoral bruits Neurological: grossly nonfocal Psych: Normal mood and affect    Wt Readings from Last 3 Encounters:  06/06/17 246 lb 9.6 oz (111.9 kg)  07/29/16 245 lb (111.1 kg)  07/25/16 246 lb 6.4 oz (111.8 kg)      Studies/Labs Reviewed:   EKG:  EKG is ordered today.  The ekg ordered today demonstrates normal sinus rhythm, tiny Q waves in leads I and aVL, no repolarization abnormalities, QRS 98 ms, QTC 435 ms  Recent Labs: 07/25/2016: BUN 17; Creat 1.03; Hemoglobin 12.9;  Platelets 227; Potassium 4.2; Sodium 142   Lipid Panel    Component Value Date/Time   CHOL 201 (H) 07/25/2016 0909   TRIG 90 07/25/2016 0909   HDL 65 07/25/2016 0909   CHOLHDL 3.1 07/25/2016 0909   VLDL 18 07/25/2016 0909   LDLCALC 118 (H) 07/25/2016 0909    ASSESSMENT:    1. Paroxysmal atrial fibrillation (HCC)   2. Class 2 severe obesity due to excess calories with serious comorbidity and body mass index (BMI) of 37.0 to 37.9 in adult [facility])      PLAN:  In order of problems listed above:   1. PAT/PAF: No clinical evidence of recurrent sustained atrial fibrillation since her last ablation, but had subjective palpitations.  These have been well controlled on propafenone and metoprolol.  After turning 65 her embolic risk score has increased by 1 (score 2 for age and gender only), but true atrial fibrillation has not been documented in many years. We'll not prescribe anticoagulation until we have more firm evidence of recurrent atrial fibrillation or atrial flutter 2. Obesity: Reminded her about the direct relationship between the risk of recurrent arrhythmia and her weight.  Encouraged to weight loss. 3. Propafenone: With normal coronary angiography in 2018, unlikely to develop coronary insufficiency future.      Medication Adjustments/Labs and Tests Ordered: Current medicines are reviewed at length with the patient today.  Concerns regarding medicines are outlined above.  Medication changes, Labs and Tests ordered today are listed in the Patient Instructions below. There are no Patient Instructions on file for this visit.     Signed, Sanda Klein, MD  06/06/2017 9:28 AM    [facility] [place], [place], [place]  [address] Phone: [phone]; Fax: [phone]

## 2017-06-06 NOTE — Patient Instructions (Signed)
Dr Croitoru recommends that you schedule a follow-up appointment in 12 months. You will receive a reminder letter in the mail two months in advance. If you don't receive a letter, please call our office to schedule the follow-up appointment.  If you need a refill on your cardiac medications before your next appointment, please call your pharmacy. 

## 2017-06-19 NOTE — Pre-Procedure Instructions (Signed)
KINSLEIGH LUDOLPH  06/19/2017      [place],  - [place] [address] [place] [address] Phone: [phone] Fax: [phone]  [place], [place] - [address] [place] [address] Phone: [phone] Fax: [phone]    Your procedure is scheduled on Thursday April 11.  Report to [facility] Admitting at 11:15 A.M.  Call this number if you have problems the morning of surgery:  [phone]   Remember:  Do not eat food or drink liquids after midnight.  Take these medicines the morning of surgery with A SIP OF WATER:   Metoprolol (Lopressor) Propafenone (Rhythmol) lansoprazole (Prevacid) acetaminophen (tylenol) if needed flonase if needed Albuterol if needed (please bring inhaler to hospital with you)  7 days prior to surgery STOP taking any Aleve, Naproxen, Ibuprofen, Motrin, Advil, Goody's, BC's, all herbal medications, fish oil, and all vitamins  **Follow your surgeon's instructions on stopping Aspirin. If no instructions were given, please call your surgeon's office**   Do not wear jewelry, make-up or nail polish.  Do not wear lotions, powders, or perfumes, or deodorant.  Do not shave 48 hours prior to surgery.  Men may shave face and neck.  Do not bring valuables to the hospital.  [facility] is not responsible for any belongings or valuables.  Contacts, dentures or bridgework may not be worn into surgery.  Leave your suitcase in the car.  After surgery it may be brought to your room.  For patients admitted to the hospital, discharge time will be determined by your treatment team.  Patients discharged the day of surgery will not be allowed to drive home.   Special instructions:   [place]- Preparing For Surgery  Before surgery, you can play an important role. Because skin is not sterile, your skin needs to be as free of germs as possible. You can reduce the  number of germs on your skin by washing with CHG (chlorahexidine gluconate) Soap before surgery.  CHG is an antiseptic cleaner which kills germs and bonds with the skin to continue killing germs even after washing.  Please do not use if you have an allergy to CHG or antibacterial soaps. If your skin becomes reddened/irritated stop using the CHG.  Do not shave (including legs and underarms) for at least 48 hours prior to first CHG shower. It is OK to shave your face.  Please follow these instructions carefully.   1. Shower the NIGHT BEFORE SURGERY and the MORNING OF SURGERY with CHG.   2. If you chose to wash your hair, wash your hair first as usual with your normal shampoo.  3. After you shampoo, rinse your hair and body thoroughly to remove the shampoo.  4. Use CHG as you would any other liquid soap. You can apply CHG directly to the skin and wash gently with a scrungie or a clean washcloth.   5. Apply the CHG Soap to your body ONLY FROM THE NECK DOWN.  Do not use on open wounds or open sores. Avoid contact with your eyes, ears, mouth and genitals (private parts). Wash Face and genitals (private parts)  with your normal soap.  6. Wash thoroughly, paying special attention to the area where your surgery will be performed.  7. Thoroughly rinse your body with warm water from the neck down.  8. DO NOT shower/wash with your normal soap after using and rinsing off the CHG Soap.  9. Pat yourself dry with a CLEAN TOWEL.  10. Wear CLEAN PAJAMAS to bed the night before surgery, wear comfortable clothes the morning of surgery  11. Place CLEAN SHEETS on your bed the night of your first shower and DO NOT SLEEP WITH PETS.    Day of Surgery: Do not apply any deodorants/lotions. Please wear clean clothes to the hospital/surgery center.      Please read over the following fact sheets that you were given. Coughing and Deep Breathing, MRSA Information and Surgical Site Infection  Prevention

## 2017-06-20 ENCOUNTER — Encounter (HOSPITAL_COMMUNITY): Payer: Self-pay

## 2017-06-20 ENCOUNTER — Other Ambulatory Visit: Payer: Self-pay

## 2017-06-20 ENCOUNTER — Encounter (HOSPITAL_COMMUNITY)
Admission: RE | Admit: 2017-06-20 | Discharge: 2017-06-20 | Disposition: A | Discharge status: Home / Self Care | Payer: Medicare Other | Source: Ambulatory Visit | Attending: Orthopedic Surgery | Admitting: Orthopedic Surgery

## 2017-06-20 DIAGNOSIS — Z01812 Encounter for preprocedural laboratory examination: Secondary | ICD-10-CM | POA: Diagnosis not present

## 2017-06-20 HISTORY — DX: Cardiac murmur, unspecified: R01.1

## 2017-06-20 LAB — BASIC METABOLIC PANEL
ANION GAP: 7 (ref 5–15)
BUN: 16 mg/dL (ref 6–20)
CALCIUM: 9.2 mg/dL (ref 8.9–10.3)
CO2: 23 mmol/L (ref 22–32)
Chloride: 107 mmol/L (ref 101–111)
Creatinine, Ser: 1.07 mg/dL — ABNORMAL HIGH (ref 0.44–1.00)
GFR, EST NON AFRICAN AMERICAN: 53 mL/min — AB (ref >60)
Glucose, Bld: 129 mg/dL — ABNORMAL HIGH (ref 65–99)
Potassium: 4.6 mmol/L (ref 3.5–5.1)
SODIUM: 137 mmol/L (ref 135–145)

## 2017-06-20 LAB — SURGICAL PCR SCREEN
MRSA, PCR: NEGATIVE
Staphylococcus aureus: NEGATIVE

## 2017-06-20 LAB — CBC
HCT: 39.3 % (ref 36.0–46.0)
HEMOGLOBIN: 12.9 g/dL (ref 12.0–15.0)
MCH: 30.3 pg (ref 26.0–34.0)
MCHC: 32.8 g/dL (ref 30.0–36.0)
MCV: 92.3 fL (ref 78.0–100.0)
Platelets: 192 10*3/uL (ref 150–400)
RBC: 4.26 MIL/uL (ref 3.87–5.11)
RDW: 13.9 % (ref 11.5–15.5)
WBC: 6 10*3/uL (ref 4.0–10.5)

## 2017-06-20 NOTE — Progress Notes (Signed)
PCP - Burman Freestone Cardiologist - Dr. Sallyanne Kuster  EKG - 06/06/17 Stress Test - 2/281/8 Cardiac Cath - 07/29/16  Aspirin Instructions: stop ASA 5 days prior to sx  Anesthesia review: cardiac clearance, stress test, EKG  Patient denies shortness of breath, fever, cough and chest pain at PAT appointment   Patient verbalized understanding of instructions that were given to them at the PAT appointment. Patient was also instructed that they will need to review over the PAT instructions again at home before surgery.

## 2017-06-23 NOTE — Progress Notes (Signed)
Anesthesia Chart Review: Patient is a 67 year old scheduled for right reverse shoulder arthroplasty on 06/26/2017 by Dr. Justice Britain.  History includes former smoker (quit '88), post-operative N/V, afib/PAF (s/p EPS/ablation X 2), dizziness, asthma, arthritis, hematuria (reportedly benign w/u), skin cancer (right nose) s/p MOHS, appendectomy, left ovarian mass s/p BSO '09, left TKA 12/07/12, right THA 03/08/15, left THA '10 with revision '12, left reverse shoulder arthroplasty 02/29/16. BMI is consistent with obesity.  PCP is listed as Burman Freestone, FNP.  Cardiologist is Dr. Sanda Klein, last visit 06/06/17. No clinical evidence of recurrent sustained afib since last ablation. Palpitations controlled on medication. Recent cath showed normal coronaries.  Meds include albuterol, amoxicillin (PRN dental procedures), aspirin 81 mg (to hold 5 days prior to surgery), Flonase, Prevacid, Claritin, Lopressor, Rythmol, probiotic.  BP (!) 166/76   Pulse 65   Temp 36.7 C   Resp 20   Ht 5\' 8"  (1.727 m)   Wt 248 lb 14.4 oz (112.9 kg)   LMP 03/18/2001   SpO2 100%   BMI 37.85 kg/m   EKG 06/06/17: SB at 58 bpm, possible lateral infarct (age undetermined).  Cardiac cath 07/29/16 (done due to abnormal ETT suggestive of ischemia 05/15/16):   The left ventricular systolic function is normal.  LV end diastolic pressure is mildly elevated. 1. Normal coronary anatomy. 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 Normal LV systolic function 3. Mildly elevated LVEDP Plan: medical management.   Preoperative labs noted. Cr 1.07. Glucose 129. CBC WNL.   If no acute changes then I anticipate that she can proceed as planned.   George Hugh Murray [facility] Short Stay Center/Anesthesiology Phone [phone] 06/24/2017 12:28 PM

## 2017-06-25 MED ORDER — BUPIVACAINE LIPOSOME 1.3 % IJ SUSP
20.0000 mL | INTRAMUSCULAR | Status: DC
  Filled 2017-06-25: qty 20

## 2017-06-25 MED ORDER — CEFAZOLIN SODIUM-DEXTROSE 2-4 GM/100ML-% IV SOLN
2.0000 g | INTRAVENOUS | Status: AC
  Administered 2017-06-26: 2 g via INTRAVENOUS
  Filled 2017-06-25: qty 100

## 2017-06-26 ENCOUNTER — Encounter (HOSPITAL_COMMUNITY): Payer: Self-pay | Admitting: Anesthesiology

## 2017-06-26 ENCOUNTER — Inpatient Hospital Stay (HOSPITAL_COMMUNITY): Payer: Medicare Other | Admitting: Anesthesiology

## 2017-06-26 ENCOUNTER — Encounter (HOSPITAL_COMMUNITY)
Admission: RE | Disposition: A | Discharge status: Home / Self Care | Payer: Self-pay | Source: Ambulatory Visit | Attending: Orthopedic Surgery

## 2017-06-26 ENCOUNTER — Inpatient Hospital Stay (HOSPITAL_COMMUNITY): Payer: Medicare Other | Admitting: Emergency Medicine

## 2017-06-26 ENCOUNTER — Inpatient Hospital Stay (HOSPITAL_COMMUNITY)
Admission: RE | Admit: 2017-06-26 | Discharge: 2017-06-27 | DRG: 483 | Disposition: A | Discharge status: Home / Self Care | Payer: Medicare Other | Source: Ambulatory Visit | Attending: Orthopedic Surgery | Admitting: Orthopedic Surgery

## 2017-06-26 DIAGNOSIS — J45909 Unspecified asthma, uncomplicated: Secondary | ICD-10-CM | POA: Diagnosis present

## 2017-06-26 DIAGNOSIS — Z823 Family history of stroke: Secondary | ICD-10-CM | POA: Diagnosis not present

## 2017-06-26 DIAGNOSIS — Z833 Family history of diabetes mellitus: Secondary | ICD-10-CM

## 2017-06-26 DIAGNOSIS — Z832 Family history of diseases of the blood and blood-forming organs and certain disorders involving the immune mechanism: Secondary | ICD-10-CM

## 2017-06-26 DIAGNOSIS — Z87891 Personal history of nicotine dependence: Secondary | ICD-10-CM

## 2017-06-26 DIAGNOSIS — E669 Obesity, unspecified: Secondary | ICD-10-CM | POA: Diagnosis present

## 2017-06-26 DIAGNOSIS — Z96619 Presence of unspecified artificial shoulder joint: Secondary | ICD-10-CM

## 2017-06-26 DIAGNOSIS — Z6837 Body mass index (BMI) 37.0-37.9, adult: Secondary | ICD-10-CM | POA: Diagnosis not present

## 2017-06-26 DIAGNOSIS — Z96641 Presence of right artificial hip joint: Secondary | ICD-10-CM | POA: Diagnosis present

## 2017-06-26 DIAGNOSIS — Z841 Family history of disorders of kidney and ureter: Secondary | ICD-10-CM

## 2017-06-26 DIAGNOSIS — M810 Age-related osteoporosis without current pathological fracture: Secondary | ICD-10-CM | POA: Diagnosis present

## 2017-06-26 DIAGNOSIS — M75101 Unspecified rotator cuff tear or rupture of right shoulder, not specified as traumatic: Principal | ICD-10-CM | POA: Diagnosis present

## 2017-06-26 DIAGNOSIS — M25511 Pain in right shoulder: Secondary | ICD-10-CM | POA: Diagnosis present

## 2017-06-26 DIAGNOSIS — Z8249 Family history of ischemic heart disease and other diseases of the circulatory system: Secondary | ICD-10-CM | POA: Diagnosis not present

## 2017-06-26 DIAGNOSIS — I4891 Unspecified atrial fibrillation: Secondary | ICD-10-CM | POA: Diagnosis present

## 2017-06-26 DIAGNOSIS — Z7982 Long term (current) use of aspirin: Secondary | ICD-10-CM

## 2017-06-26 DIAGNOSIS — Z85828 Personal history of other malignant neoplasm of skin: Secondary | ICD-10-CM | POA: Diagnosis not present

## 2017-06-26 DIAGNOSIS — M1990 Unspecified osteoarthritis, unspecified site: Secondary | ICD-10-CM | POA: Diagnosis present

## 2017-06-26 DIAGNOSIS — Z96652 Presence of left artificial knee joint: Secondary | ICD-10-CM | POA: Diagnosis present

## 2017-06-26 DIAGNOSIS — Z8041 Family history of malignant neoplasm of ovary: Secondary | ICD-10-CM | POA: Diagnosis not present

## 2017-06-26 DIAGNOSIS — Z96612 Presence of left artificial shoulder joint: Secondary | ICD-10-CM | POA: Diagnosis present

## 2017-06-26 HISTORY — PX: REVERSE SHOULDER ARTHROPLASTY: SHX5054

## 2017-06-26 SURGERY — ARTHROPLASTY, SHOULDER, TOTAL, REVERSE
Anesthesia: General | Site: Shoulder | Laterality: Right

## 2017-06-26 MED ORDER — PROPOFOL 10 MG/ML IV BOLUS
INTRAVENOUS | Status: AC
  Filled 2017-06-26: qty 20

## 2017-06-26 MED ORDER — TRANEXAMIC ACID 1000 MG/10ML IV SOLN
1000.0000 mg | INTRAVENOUS | Status: DC
  Filled 2017-06-26: qty 10

## 2017-06-26 MED ORDER — KETOROLAC TROMETHAMINE 15 MG/ML IJ SOLN
7.5000 mg | Freq: Four times a day (QID) | INTRAMUSCULAR | Status: AC
  Administered 2017-06-26 – 2017-06-27 (×4): 7.5 mg via INTRAVENOUS
  Filled 2017-06-26 (×4): qty 1

## 2017-06-26 MED ORDER — ROCURONIUM BROMIDE 100 MG/10ML IV SOLN
INTRAVENOUS | Status: DC | PRN
  Administered 2017-06-26: 50 mg via INTRAVENOUS

## 2017-06-26 MED ORDER — PHENOL 1.4 % MT LIQD: 1.0000 | OROMUCOSAL | Status: DC | PRN

## 2017-06-26 MED ORDER — ALBUTEROL SULFATE HFA 108 (90 BASE) MCG/ACT IN AERS: 2.0000 | INHALATION_SPRAY | Freq: Four times a day (QID) | RESPIRATORY_TRACT | Status: DC | PRN

## 2017-06-26 MED ORDER — OXYCODONE HCL 5 MG PO TABS: 5.0000 mg | ORAL_TABLET | Freq: Once | ORAL | Status: DC | PRN

## 2017-06-26 MED ORDER — BUPIVACAINE-EPINEPHRINE (PF) 0.5% -1:200000 IJ SOLN
INTRAMUSCULAR | Status: DC | PRN
  Administered 2017-06-26: 12 mL via PERINEURAL

## 2017-06-26 MED ORDER — ONDANSETRON HCL 4 MG/2ML IJ SOLN
INTRAMUSCULAR | Status: DC | PRN
  Administered 2017-06-26: 4 mg via INTRAVENOUS

## 2017-06-26 MED ORDER — OXYCODONE HCL 5 MG/5ML PO SOLN: 5.0000 mg | Freq: Once | ORAL | Status: DC | PRN

## 2017-06-26 MED ORDER — ONDANSETRON HCL 4 MG PO TABS: 4.0000 mg | ORAL_TABLET | Freq: Four times a day (QID) | ORAL | Status: DC | PRN

## 2017-06-26 MED ORDER — BUPIVACAINE LIPOSOME 1.3 % IJ SUSP
INTRAMUSCULAR | Status: DC | PRN
  Administered 2017-06-26: 10 mL via PERINEURAL

## 2017-06-26 MED ORDER — METOCLOPRAMIDE HCL 5 MG/ML IJ SOLN: 5.0000 mg | Freq: Three times a day (TID) | INTRAMUSCULAR | Status: DC | PRN

## 2017-06-26 MED ORDER — LORATADINE 10 MG PO TABS
10.0000 mg | ORAL_TABLET | Freq: Every day | ORAL | Status: DC
  Administered 2017-06-26: 10 mg via ORAL
  Filled 2017-06-26: qty 1

## 2017-06-26 MED ORDER — HYDROMORPHONE HCL 1 MG/ML IJ SOLN: 0.5000 mg | INTRAMUSCULAR | Status: DC | PRN

## 2017-06-26 MED ORDER — MIDAZOLAM HCL 2 MG/2ML IJ SOLN
1.5000 mg | Freq: Once | INTRAMUSCULAR | Status: AC
  Administered 2017-06-26: 1.5 mg via INTRAVENOUS

## 2017-06-26 MED ORDER — MENTHOL 3 MG MT LOZG: 1.0000 | LOZENGE | OROMUCOSAL | Status: DC | PRN

## 2017-06-26 MED ORDER — FENTANYL CITRATE (PF) 100 MCG/2ML IJ SOLN
INTRAMUSCULAR | Status: DC | PRN
  Administered 2017-06-26: 50 ug via INTRAVENOUS

## 2017-06-26 MED ORDER — SUGAMMADEX SODIUM 200 MG/2ML IV SOLN
INTRAVENOUS | Status: DC | PRN
  Administered 2017-06-26: 200 mg via INTRAVENOUS

## 2017-06-26 MED ORDER — LIDOCAINE 2% (20 MG/ML) 5 ML SYRINGE
INTRAMUSCULAR | Status: AC
  Filled 2017-06-26: qty 5

## 2017-06-26 MED ORDER — ONDANSETRON HCL 4 MG/2ML IJ SOLN: 4.0000 mg | Freq: Four times a day (QID) | INTRAMUSCULAR | Status: DC | PRN

## 2017-06-26 MED ORDER — PROMETHAZINE HCL 25 MG/ML IJ SOLN
6.2500 mg | Freq: Four times a day (QID) | INTRAMUSCULAR | Status: DC | PRN
  Administered 2017-06-26: 6.25 mg via INTRAVENOUS

## 2017-06-26 MED ORDER — LACTATED RINGERS IV SOLN
INTRAVENOUS | Status: DC
  Administered 2017-06-26: 16:00:00 via INTRAVENOUS

## 2017-06-26 MED ORDER — MIDAZOLAM HCL 2 MG/2ML IJ SOLN
INTRAMUSCULAR | Status: AC
  Filled 2017-06-26: qty 2

## 2017-06-26 MED ORDER — DEXAMETHASONE SODIUM PHOSPHATE 10 MG/ML IJ SOLN
INTRAMUSCULAR | Status: DC | PRN
  Administered 2017-06-26: 5 mg via INTRAVENOUS

## 2017-06-26 MED ORDER — PROPAFENONE HCL 150 MG PO TABS
150.0000 mg | ORAL_TABLET | Freq: Three times a day (TID) | ORAL | Status: DC
  Administered 2017-06-26 – 2017-06-27 (×3): 150 mg via ORAL
  Filled 2017-06-26 (×4): qty 1

## 2017-06-26 MED ORDER — FENTANYL CITRATE (PF) 250 MCG/5ML IJ SOLN
INTRAMUSCULAR | Status: AC
Start: 2017-06-26 — End: ?
  Filled 2017-06-26: qty 5

## 2017-06-26 MED ORDER — CHLORHEXIDINE GLUCONATE 4 % EX LIQD: 60.0000 mL | Freq: Once | CUTANEOUS | Status: DC

## 2017-06-26 MED ORDER — 0.9 % SODIUM CHLORIDE (POUR BTL) OPTIME
TOPICAL | Status: DC | PRN
  Administered 2017-06-26: 1000 mL

## 2017-06-26 MED ORDER — HYDROMORPHONE HCL 1 MG/ML IJ SOLN: 0.2500 mg | INTRAMUSCULAR | Status: DC | PRN

## 2017-06-26 MED ORDER — METOCLOPRAMIDE HCL 5 MG PO TABS: 5.0000 mg | ORAL_TABLET | Freq: Three times a day (TID) | ORAL | Status: DC | PRN

## 2017-06-26 MED ORDER — DOCUSATE SODIUM 100 MG PO CAPS
100.0000 mg | ORAL_CAPSULE | Freq: Two times a day (BID) | ORAL | Status: DC
  Administered 2017-06-26 – 2017-06-27 (×2): 100 mg via ORAL
  Filled 2017-06-26 (×2): qty 1

## 2017-06-26 MED ORDER — ACETAMINOPHEN 325 MG PO TABS: 325.0000 mg | ORAL_TABLET | Freq: Four times a day (QID) | ORAL | Status: DC | PRN

## 2017-06-26 MED ORDER — ALBUTEROL SULFATE (2.5 MG/3ML) 0.083% IN NEBU
2.5000 mg | INHALATION_SOLUTION | Freq: Four times a day (QID) | RESPIRATORY_TRACT | Status: DC | PRN
  Administered 2017-06-27: 2.5 mg via RESPIRATORY_TRACT
  Filled 2017-06-26: qty 3

## 2017-06-26 MED ORDER — BUPIVACAINE-EPINEPHRINE (PF) 0.5% -1:200000 IJ SOLN: INTRAMUSCULAR | Status: DC | PRN

## 2017-06-26 MED ORDER — SCOPOLAMINE 1 MG/3DAYS TD PT72
1.0000 | MEDICATED_PATCH | TRANSDERMAL | Status: DC
  Filled 2017-06-26: qty 1

## 2017-06-26 MED ORDER — DIPHENHYDRAMINE HCL 12.5 MG/5ML PO ELIX: 12.5000 mg | ORAL_SOLUTION | ORAL | Status: DC | PRN

## 2017-06-26 MED ORDER — DEXTROSE 5 % IV SOLN
INTRAVENOUS | Status: DC | PRN
  Administered 2017-06-26: 25 ug/min via INTRAVENOUS

## 2017-06-26 MED ORDER — ASPIRIN EC 81 MG PO TBEC
81.0000 mg | DELAYED_RELEASE_TABLET | Freq: Every day | ORAL | Status: DC
  Administered 2017-06-26 – 2017-06-27 (×2): 81 mg via ORAL
  Filled 2017-06-26 (×2): qty 1

## 2017-06-26 MED ORDER — LACTATED RINGERS IV SOLN
INTRAVENOUS | Status: DC
  Administered 2017-06-26: 10 mL/h via INTRAVENOUS

## 2017-06-26 MED ORDER — MAGNESIUM CITRATE PO SOLN: 1.0000 | Freq: Once | ORAL | Status: DC | PRN

## 2017-06-26 MED ORDER — PROMETHAZINE HCL 25 MG/ML IJ SOLN
INTRAMUSCULAR | Status: AC
  Filled 2017-06-26: qty 1

## 2017-06-26 MED ORDER — OXYCODONE HCL 5 MG PO TABS: 10.0000 mg | ORAL_TABLET | ORAL | Status: DC | PRN

## 2017-06-26 MED ORDER — ALUM & MAG HYDROXIDE-SIMETH 200-200-20 MG/5ML PO SUSP: 30.0000 mL | ORAL | Status: DC | PRN

## 2017-06-26 MED ORDER — GLYCOPYRROLATE 0.2 MG/ML IJ SOLN
INTRAMUSCULAR | Status: DC | PRN
  Administered 2017-06-26: 0.2 mg via INTRAVENOUS

## 2017-06-26 MED ORDER — FENTANYL CITRATE (PF) 100 MCG/2ML IJ SOLN
INTRAMUSCULAR | Status: AC
  Administered 2017-06-26: 75 ug
  Filled 2017-06-26: qty 2

## 2017-06-26 MED ORDER — OXYCODONE HCL 5 MG PO TABS: 5.0000 mg | ORAL_TABLET | ORAL | Status: DC | PRN

## 2017-06-26 MED ORDER — TIZANIDINE HCL 4 MG PO TABS: 4.0000 mg | ORAL_TABLET | Freq: Four times a day (QID) | ORAL | Status: DC | PRN

## 2017-06-26 MED ORDER — FENTANYL CITRATE (PF) 100 MCG/2ML IJ SOLN: 75.0000 ug | Freq: Once | INTRAMUSCULAR | Status: DC

## 2017-06-26 MED ORDER — LIDOCAINE HCL (CARDIAC) 20 MG/ML IV SOLN
INTRAVENOUS | Status: DC | PRN
  Administered 2017-06-26: 40 mg via INTRAVENOUS

## 2017-06-26 MED ORDER — BISACODYL 5 MG PO TBEC: 5.0000 mg | DELAYED_RELEASE_TABLET | Freq: Every day | ORAL | Status: DC | PRN

## 2017-06-26 MED ORDER — PROPOFOL 10 MG/ML IV BOLUS
INTRAVENOUS | Status: DC | PRN
  Administered 2017-06-26: 160 mg via INTRAVENOUS

## 2017-06-26 MED ORDER — PANTOPRAZOLE SODIUM 40 MG PO TBEC
40.0000 mg | DELAYED_RELEASE_TABLET | Freq: Every day | ORAL | Status: DC
  Administered 2017-06-26 – 2017-06-27 (×2): 40 mg via ORAL
  Filled 2017-06-26 (×2): qty 1

## 2017-06-26 MED ORDER — POLYETHYLENE GLYCOL 3350 17 G PO PACK: 17.0000 g | PACK | Freq: Every day | ORAL | Status: DC | PRN

## 2017-06-26 MED ORDER — METOPROLOL TARTRATE 25 MG PO TABS
25.0000 mg | ORAL_TABLET | Freq: Two times a day (BID) | ORAL | Status: DC
  Administered 2017-06-26 – 2017-06-27 (×2): 25 mg via ORAL
  Filled 2017-06-26 (×2): qty 1

## 2017-06-26 SURGICAL SUPPLY — 61 items
ADH SKN CLS APL DERMABOND .7 (GAUZE/BANDAGES/DRESSINGS) ×1
AID PSTN UNV HD RSTRNT DISP (MISCELLANEOUS) ×1
BASEPLATE GLENOID SHLDR SM (Shoulder) ×1 IMPLANT
BLADE SAW SGTL 83.5X18.5 (BLADE) ×2 IMPLANT
BSPLAT GLND SM PRFT SHLDR CA (Shoulder) ×1 IMPLANT
COVER SURGICAL LIGHT HANDLE (MISCELLANEOUS) ×2 IMPLANT
CUP SUT UNIV REVERS 36+2 RT (Cup) ×1 IMPLANT
DERMABOND ADVANCED (GAUZE/BANDAGES/DRESSINGS) ×1
DERMABOND ADVANCED .7 DNX12 (GAUZE/BANDAGES/DRESSINGS) ×1 IMPLANT
DRAPE ORTHO SPLIT 77X108 STRL (DRAPES) ×4
DRAPE SURG 17X11 SM STRL (DRAPES) ×2 IMPLANT
DRAPE SURG ORHT 6 SPLT 77X108 (DRAPES) ×2 IMPLANT
DRAPE U-SHAPE 47X51 STRL (DRAPES) ×2 IMPLANT
DRSG AQUACEL AG ADV 3.5X10 (GAUZE/BANDAGES/DRESSINGS) ×2 IMPLANT
DRSG MEPILEX BORDER 4X8 (GAUZE/BANDAGES/DRESSINGS) ×2 IMPLANT
DURAPREP 26ML APPLICATOR (WOUND CARE) ×2 IMPLANT
ELECT BLADE 4.0 EZ CLEAN MEGAD (MISCELLANEOUS) ×2
ELECT CAUTERY BLADE 6.4 (BLADE) ×2 IMPLANT
ELECT REM PT RETURN 9FT ADLT (ELECTROSURGICAL) ×2
ELECTRODE BLDE 4.0 EZ CLN MEGD (MISCELLANEOUS) ×1 IMPLANT
ELECTRODE REM PT RTRN 9FT ADLT (ELECTROSURGICAL) ×1 IMPLANT
FACESHIELD WRAPAROUND (MASK) ×6 IMPLANT
GLENOSPHERE LATERAL 36MM+4 (Shoulder) ×2 IMPLANT
GLOVE BIO SURGEON STRL SZ7.5 (GLOVE) ×2 IMPLANT
GLOVE BIO SURGEON STRL SZ8 (GLOVE) ×2 IMPLANT
GLOVE EUDERMIC 7 POWDERFREE (GLOVE) ×4 IMPLANT
GLOVE SS BIOGEL STRL SZ 7.5 (GLOVE) ×1 IMPLANT
GLOVE SUPERSENSE BIOGEL SZ 7.5 (GLOVE) ×1
GOWN STRL REUS W/ TWL LRG LVL3 (GOWN DISPOSABLE) ×1 IMPLANT
GOWN STRL REUS W/ TWL XL LVL3 (GOWN DISPOSABLE) ×3 IMPLANT
GOWN STRL REUS W/TWL LRG LVL3 (GOWN DISPOSABLE) ×2
GOWN STRL REUS W/TWL XL LVL3 (GOWN DISPOSABLE) ×6
INSERT HUMERAL 36 +6 (Shoulder) ×1 IMPLANT
KIT BASIN OR (CUSTOM PROCEDURE TRAY) ×2 IMPLANT
KIT TURNOVER KIT B (KITS) ×2 IMPLANT
MANIFOLD NEPTUNE II (INSTRUMENTS) ×2 IMPLANT
NDL SUT 6 .5 CRC .975X.05 MAYO (NEEDLE) IMPLANT
NEEDLE MAYO TAPER (NEEDLE)
NEEDLE TAPERED W/ NITINOL LOOP (MISCELLANEOUS) ×2 IMPLANT
NS IRRIG 1000ML POUR BTL (IV SOLUTION) ×2 IMPLANT
PACK SHOULDER (CUSTOM PROCEDURE TRAY) ×2 IMPLANT
PAD ARMBOARD 7.5X6 YLW CONV (MISCELLANEOUS) ×4 IMPLANT
PIN SET MODULAR GLENOID SYSTEM (PIN) ×2 IMPLANT
RESTRAINT HEAD UNIVERSAL NS (MISCELLANEOUS) ×2 IMPLANT
SCREW LOCK PERIPHERAL 30MM (Shoulder) ×2 IMPLANT
SCREW LOCK PERIPHERAL 36MM (Screw) ×1 IMPLANT
SCREW NON LOCK 6.5X15 (Shoulder) ×2 IMPLANT
SLING ARM FOAM STRAP LRG (SOFTGOODS) IMPLANT
SPONGE LAP 18X18 X RAY DECT (DISPOSABLE) ×2 IMPLANT
SPONGE LAP 4X18 X RAY DECT (DISPOSABLE) ×2 IMPLANT
STEM HUMERAL UNI REVERS SZ6 (Stem) ×2 IMPLANT
SUT FIBERWIRE #2 38 T-5 BLUE (SUTURE) ×6
SUT MNCRL AB 3-0 PS2 18 (SUTURE) ×2 IMPLANT
SUT MON AB 2-0 CT1 27 (SUTURE) ×2 IMPLANT
SUT MON AB 2-0 CT1 36 (SUTURE) ×2 IMPLANT
SUT VIC AB 1 CT1 27 (SUTURE) ×2
SUT VIC AB 1 CT1 27XBRD ANBCTR (SUTURE) ×1 IMPLANT
SUTURE FIBERWR #2 38 T-5 BLUE (SUTURE) ×3 IMPLANT
TOWEL OR 17X24 6PK STRL BLUE (TOWEL DISPOSABLE) ×2 IMPLANT
TOWEL OR 17X26 10 PK STRL BLUE (TOWEL DISPOSABLE) ×2 IMPLANT
WATER STERILE IRR 1000ML POUR (IV SOLUTION) ×2 IMPLANT

## 2017-06-26 NOTE — Anesthesia Preprocedure Evaluation (Signed)
Anesthesia Evaluation  Patient identified by MRN, date of birth, ID band Patient awake    Reviewed: Allergy & Precautions, NPO status , Patient's Chart, lab work & pertinent test results  History of Anesthesia Complications (+) PONV and history of anesthetic complications  Airway Mallampati: II  TM Distance: >3 FB Neck ROM: Full    Dental no notable dental hx.    Pulmonary asthma , former smoker,    breath sounds clear to auscultation       Cardiovascular + dysrhythmias  Rhythm:Regular Rate:Normal     Neuro/Psych    GI/Hepatic negative GI ROS, Neg liver ROS,   Endo/Other  negative endocrine ROS  Renal/GU negative Renal ROS     Musculoskeletal  (+) Arthritis ,   Abdominal (+) + obese,   Peds  Hematology negative hematology ROS (+)   Anesthesia Other Findings   Reproductive/Obstetrics                             Anesthesia Physical Anesthesia Plan  ASA: III  Anesthesia Plan: General   Post-op Pain Management:  Regional for Post-op pain   Induction: Intravenous  PONV Risk Score and Plan: 4 or greater and Treatment may vary due to age or medical condition, Dexamethasone, Ondansetron and Scopolamine patch - Pre-op  Airway Management Planned: Oral ETT  Additional Equipment:   Intra-op Plan:   Post-operative Plan: Extubation in OR  Informed Consent: I have reviewed the patients History and Physical, chart, labs and discussed the procedure including the risks, benefits and alternatives for the proposed anesthesia with the patient or authorized representative who has indicated his/her understanding and acceptance.   Dental advisory given  Plan Discussed with: CRNA  Anesthesia Plan Comments:         Anesthesia Quick Evaluation

## 2017-06-26 NOTE — Anesthesia Procedure Notes (Signed)
Procedure Name: Intubation Date/Time: 06/26/2017 1:27 PM Performed by: Kyung Rudd, CRNA Pre-anesthesia Checklist: Patient identified, Emergency Drugs available, Suction available and Patient being monitored Patient Re-evaluated:Patient Re-evaluated prior to induction Oxygen Delivery Method: Circle system utilized Preoxygenation: Pre-oxygenation with 100% oxygen Induction Type: IV induction Ventilation: Mask ventilation without difficulty Laryngoscope Size: Mac and 3 Grade View: Grade I Tube type: Oral Tube size: 7.0 mm Number of attempts: 1 Airway Equipment and Method: Stylet Placement Confirmation: ETT inserted through vocal cords under direct vision,  positive ETCO2 and breath sounds checked- equal and bilateral Secured at: 21 cm Tube secured with: Tape Dental Injury: Teeth and Oropharynx as per pre-operative assessment

## 2017-06-26 NOTE — Op Note (Signed)
06/26/2017  2:55 PM  PATIENT:   Donna Moon  67 y.o. female  PRE-OPERATIVE DIAGNOSIS:  Right shoulder rotator cuff tear arthropathy  POST-OPERATIVE DIAGNOSIS:  same  PROCEDURE:  R RSA #6 stem, +6 poly, 36/+4 glenosphere, small baseplate  SURGEON:  Neale Marzette, Metta Clines M.D.  ASSISTANTS: Shuford pac   ANESTHESIA:   GET + ISB  EBL: 200  SPECIMEN:  none  Drains: none   PATIENT DISPOSITION:  PACU - hemodynamically stable.    PLAN OF CARE: Admit for overnight observation  Dictation# [id number]   Contact # [phone]

## 2017-06-26 NOTE — Progress Notes (Signed)
Pt feels much better after IV fluids and IV Phenergan. She chooses to refuse Scopolamine patch at this time-says when she had used this in the past, "it made me feel worse."

## 2017-06-26 NOTE — Anesthesia Procedure Notes (Addendum)
Anesthesia Regional Block: Interscalene brachial plexus block   Pre-Anesthetic Checklist: ,, timeout performed, Correct Patient, Correct Site, Correct Laterality, Correct Procedure, Correct Position, site marked, Risks and benefits discussed,  Surgical consent,  Pre-op evaluation,  At surgeon's request and post-op pain management  Laterality: Upper and Right  Prep: Betadine, chloraprep       Needles:  Injection technique: Single-shot  Needle Type: Echogenic Stimulator Needle     Needle Length: 9cm  Needle Gauge: 21   Needle insertion depth: 4 cm   Additional Needles:   Procedures:,,,, ultrasound used (permanent image in chart),,,,   Nerve Stimulator or Paresthesia:  Response: Twitch elicited, 0.5 mA, 0.3 ms,   Additional Responses:   Narrative:  Start time: 06/26/2017 12:15 PM End time: 06/26/2017 12:37 PM Injection made incrementally with aspirations every 5 mL.  Performed by: Personally  Anesthesiologist: Rica Koyanagi, MD  Additional Notes: Block assessed prior to start of surgery

## 2017-06-26 NOTE — H&P (Signed)
Donna Moon    Chief Complaint: Right shoulder rotator cuff tear arthropathy HPI: The patient is a 67 y.o. female with end stage right shoulder rotator cuff tear arthropathy  Past Medical History:  Diagnosis Date  . Arthritis   . Asthma   . Atrial fibrillation ([place])   . Blood transfusion without reported diagnosis    as a child--age 58  . Cancer [facility])    Mohs surgery for skin cancer on right side of nose   . Dizziness   . Dyspareunia   . Dysrhythmia    atrial fib  . Heart murmur   . Hematuria    hx of has been worked up and no problems   . Hx of seasonal allergies    tx. Allegra, Flonase  . Obesity   . Osteoporosis   . Pneumonia    hx of   . PONV (postoperative nausea and vomiting)    however no problems with past surgeries here.    Past Surgical History:  Procedure Laterality Date  . APPENDECTOMY  1968  . CARDIAC ELECTROPHYSIOLOGY STUDY AND ABLATION  1997 & 2011   x 2  . Carpel Tunnel Bilateral 1993  . CERVICAL CONE BIOPSY     laparoscopic BSO--endometrioma  . CESAREAN SECTION  1986  . COLONOSCOPY  2009   Dr. Algis Greenhouse NL  . LEFT HEART CATH AND CORONARY ANGIOGRAPHY N/A 07/29/2016   Procedure: Left Heart Cath and Coronary Angiography;  Surgeon: Martinique, Peter M, MD;  Location: [facility];  Service: Cardiovascular;  Laterality: N/A;  . OOPHORECTOMY  2009  . REVERSE SHOULDER ARTHROPLASTY Left 02/29/2016   Procedure: REVERSE SHOULDER ARTHROPLASTY;  Surgeon: Justice Britain, MD;  Location: [facility];  Service: Orthopedics;  Laterality: Left;  . ROTATOR CUFF REPAIR Right 1999   x2 -'97, 99  . TOTAL HIP ARTHROPLASTY  12/27/2010   x2 replacements, x1 repair.  Marland Kitchen TOTAL HIP ARTHROPLASTY Right 03/08/2015   Procedure: TOTAL RIGHT  HIP ARTHROPLASTY ANTERIOR APPROACH;  Surgeon: Gaynelle Arabian, MD;  Location: [facility];  Service: Orthopedics;  Laterality: Right;  . TOTAL KNEE ARTHROPLASTY Left 12/07/2012   Procedure: LEFT TOTAL KNEE ARTHROPLASTY;  Surgeon: Gearlean Alf, MD;   Location: [facility];  Service: Orthopedics;  Laterality: Left;  . UPPER GASTROINTESTINAL ENDOSCOPY  2009   Shiflett, NL    Family History  Problem Relation Age of Onset  . Stroke Mother   . Ovarian cancer Mother 74       dec uterine or ovarian ca  . Ulcerative colitis Mother   . Kidney disease Mother   . Heart attack Father   . Heart attack Brother   . Diabetes Sister        AODM  . Diabetes Sister        AODM  . Irritable bowel syndrome Sister   . Cancer Sister        kidney cancer  . Diabetes Maternal Grandmother   . Diabetes Paternal Grandmother   . Lupus Sister        dec age 10 complications from Lupus    Social History:  reports that she quit smoking about 31 years ago. Her smoking use included cigarettes. She has a 4.50 pack-year smoking history. She has never used smokeless tobacco. She reports that she does not drink alcohol or use drugs.   Medications Prior to Admission  Medication Sig Dispense Refill  . acetaminophen (TYLENOL) 500 MG tablet Take 1,000 mg by mouth daily as needed for  moderate pain or headache.    . albuterol (PROVENTIL HFA;VENTOLIN HFA) 108 (90 BASE) MCG/ACT inhaler Inhale 2 puffs into the lungs every 6 (six) hours as needed for wheezing.    Marland Kitchen amoxicillin (AMOXIL) 500 MG capsule Take 2,000 mg by mouth See admin instructions. Take 4 capsules (2000 mg) by mouth 1 hour prior to dental procedures.    Marland Kitchen aspirin EC 81 MG tablet Take 81 mg by mouth daily.     . Calcium Carb-Cholecalciferol (CALCIUM 600+D3 PO) Take 1 tablet by mouth daily.    . fluticasone (FLONASE) 50 MCG/ACT nasal spray Place 2 sprays into both nostrils daily at 6 (six) AM.     . ibuprofen (ADVIL,MOTRIN) 800 MG tablet Take 800 mg by mouth daily as needed (for pain).    Marland Kitchen lansoprazole (PREVACID) 30 MG capsule Take 30 mg by mouth daily before breakfast.    . loratadine (CLARITIN) 10 MG tablet Take 10 mg by mouth at bedtime.     . metoprolol tartrate (LOPRESSOR) 25 MG tablet Take 1 tablet (25  mg total) by mouth every morning AND 2 tablets (50 mg total) every evening. 270 tablet 3  . Probiotic Product (PROBIOTIC PO) Take 1 capsule by mouth daily.     . propafenone (RYTHMOL) 150 MG tablet Take 1 tablet (150 mg total) by mouth every 8 (eight) hours. 270 tablet 3  . albuterol (PROVENTIL) (2.5 MG/3ML) 0.083% nebulizer solution Take 2.5 mg by nebulization every 6 (six) hours as needed for wheezing or shortness of breath.       Physical Exam: right shoulder with painful and restricted motion as noted at recent office visits  Vitals  Temp:  [98.1 F (36.7 C)] 98.1 F (36.7 C) (04/11 1123) Pulse Rate:  [59] 59 (04/11 1123) Resp:  [18] 18 (04/11 1123) BP: (167)/(73) 167/73 (04/11 1123) SpO2:  [100 %] 100 % (04/11 1123) Weight:  [112.9 kg (248 lb 14.4 oz)] 112.9 kg (248 lb 14.4 oz) (04/11 1123)  Assessment/Plan  Impression: Right shoulder rotator cuff tear arthropathy  Plan of Action: Procedure(s): RIGHT REVERSE SHOULDER ARTHROPLASTY  Donna Moon M Donna Moon 06/26/2017, 12:36 PM Contact # [phone]

## 2017-06-26 NOTE — Discharge Instructions (Signed)
° °Kevin M. Supple, M.D., F.A.A.O.S. °Orthopaedic Surgery °Specializing in Arthroscopic and Reconstructive °Surgery of the Shoulder and Knee °[phone] °[address] - [place], [place] [address] - Fax [phone] ° ° °POST-OP TOTAL SHOULDER REPLACEMENT INSTRUCTIONS ° °1. Call the office at [phone] to schedule your first post-op appointment 10-14 days from the date of your surgery. ° °2. The bandage over your incision is waterproof. You may begin showering with this dressing on. You may leave this dressing on until first follow up appointment within 2 weeks. We prefer you leave this dressing in place until follow up however after 5-7 days if you are having itching or skin irritation and would like to remove it you may do so. Go slow and tug at the borders gently to break the bond the dressing has with the skin. At this point if there is no drainage it is okay to go without a bandage or you may cover it with a light guaze and tape. You can also expect significant bruising around your shoulder that will drift down your arm and into your chest wall. This is very normal and should resolve over several days. ° ° 3. Wear your sling/immobilizer at all times except to perform the exercises below or to occasionally let your arm dangle by your side to stretch your elbow. You also need to sleep in your sling immobilizer until instructed otherwise. ° °4. Range of motion to your elbow, wrist, and hand are encouraged 3-5 times daily. Exercise to your hand and fingers helps to reduce swelling you may experience. ° °5. Utilize ice to the shoulder 3-5 times minimum a day and additionally if you are experiencing pain. ° °6. Prescriptions for a pain medication and a muscle relaxant are provided for you. It is recommended that if you are experiencing pain that you pain medication alone is not controlling, add the muscle relaxant along with the pain medication which can give additional pain relief. The first 1-2 days  is generally the most severe of your pain and then should gradually decrease. As your pain lessens it is recommended that you decrease your use of the pain medications to an "as needed basis'" only and to always comply with the recommended dosages of the pain medications. ° °7. Pain medications can produce constipation along with their use. If you experience this, the use of an over the counter stool softener or laxative daily is recommended.  ° °8. For additional questions or concerns, please do not hesitate to call the office. If after hours there is an answering service to forward your concerns to the physician on call. ° °9.Pain control following an exparel block ° °To help control your post-operative pain you received a nerve block  performed with Exparel which is a long acting anesthetic (numbing agent) which can provide pain relief and sensations of numbness (and relief of pain) in the operative shoulder and arm for up to 3 days. Sometimes it provides mixed relief, meaning you may still have numbness in certain areas of the arm but can still be able to move  parts of that arm, hand, and fingers. We recommend that your prescribed pain medications  be used as needed. We do not feel it is necessary to "pre medicate" and "stay ahead" of pain.  Taking narcotic pain medications when you are not having any pain can lead to unnecessary and potentially dangerous side effects.  ° °POST-OP EXERCISES ° °Pendulum Exercises ° °Perform pendulum exercises while standing and bending at   the waist. Support your uninvolved arm on a table or chair and allow your operated arm to hang freely. Make sure to do these exercises passively - not using you shoulder muscles. ° °Repeat 20 times. Do 3 sessions per day. ° ° ° ° °

## 2017-06-26 NOTE — Transfer of Care (Signed)
Immediate Anesthesia Transfer of Care Note  Patient: Donna Moon  Procedure(s) Performed: RIGHT REVERSE SHOULDER ARTHROPLASTY (Right Shoulder)  Patient Location: PACU  Anesthesia Type:GA combined with regional for post-op pain  Level of Consciousness: awake, alert  and oriented  Airway & Oxygen Therapy: Patient Spontanous Breathing and Patient connected to nasal cannula oxygen  Post-op Assessment: Report given to RN and Post -op Vital signs reviewed and stable  Post vital signs: Reviewed and stable  Last Vitals:  Vitals Value Taken Time  BP    Temp    Pulse 58 06/26/2017  3:16 PM  Resp 14 06/26/2017  3:16 PM  SpO2 99 % 06/26/2017  3:16 PM  Vitals shown include unvalidated device data.  Last Pain:  Vitals:   06/26/17 1220  TempSrc:   PainSc: (P) 0-No pain      Patients Stated Pain Goal: 2 (80/03/49 1791)  Complications: No apparent anesthesia complications

## 2017-06-27 ENCOUNTER — Encounter (HOSPITAL_COMMUNITY): Payer: Self-pay | Admitting: Orthopedic Surgery

## 2017-06-27 MED ORDER — METHOCARBAMOL 500 MG PO TABS: 500.0000 mg | ORAL_TABLET | Freq: Three times a day (TID) | ORAL | 1 refills | Status: DC | PRN

## 2017-06-27 MED ORDER — OXYCODONE-ACETAMINOPHEN 5-325 MG PO TABS: 1.0000 | ORAL_TABLET | ORAL | 0 refills | Status: DC | PRN

## 2017-06-27 NOTE — Op Note (Signed)
NAME:  DAYLEN, HACK NO.:  MEDICAL RECORD NO.:  [id number]  LOCATION:                                 FACILITY:  PHYSICIAN:  Metta Clines. Oneill Bais, M.D.       DATE OF BIRTH:  DATE OF PROCEDURE:  06/26/2017 DATE OF DISCHARGE:                              OPERATIVE REPORT   PREOPERATIVE DIAGNOSIS:  End-stage right shoulder rotator cuff tear arthropathy.  POSTOPERATIVE DIAGNOSIS:  End-stage right shoulder rotator cuff tear arthropathy.  PROCEDURE:  Right shoulder reverse arthroplasty utilizing a press-fit size 6 Arthrex stem with a posterior offset metaphysis +6 polyethylene insert, a 36+ 4 glenosphere on a small baseplate.  SURGEON:  Metta Clines. Georgeana Oertel, M.D.  Terrence DupontOlivia Mackie A. Shuford, P.A.-C.  ANESTHESIA:  General endotracheal as well as an interscalene block.  ESTIMATED BLOOD LOSS:  200 mL.  DRAINS:  None.  HISTORY:  Ms. Gunkel is a 67 year old female who has had chronic right shoulder pain with progressively increasing functional limitations related to end-stage rotator cuff tear arthropathy.  She has had a left shoulder reverse arthroplasty.  She has done very well clinically and is now brought to the operating room today for planned right shoulder reverse arthroplasty.  Preoperatively, I counseled Ms. Conaway regarding treatment options as well as potential risks versus benefits thereof.  Possible surgical complications were all reviewed including potential for bleeding, infection, neurovascular injury, persistent pain, loss of motion, anesthetic complication, failure of the implant, and possible need for additional surgery.  She understands and accepts and agrees with our planned procedure.  PROCEDURE IN DETAIL:  After undergoing routine preop evaluation, the patient received prophylactic antibiotics and an interscalene block was established in the holding area by the Anesthesia Department using Exparel.  Brought to the operating room, placed  supine on the operating table.  Underwent smooth induction of a general endotracheal anesthesia. Placed in a beach-chair position and appropriately padded and protected. Right shoulder girdle region was sterilely prepped and draped in the standard fashion.  Time-out was called.  In an anterior deltopectoral approach, the right shoulder was made through an 8 cm incision.  Skin flaps were elevated.  Dissection carried deeply.  Deltopectoral interval developed and the vein taken laterally with the deltoid.  The upper centimeter and a half pec major tendon was tenotomized to improve exposure.  Conjoint tendon mobilized and retracted medially, adhesions were divided beneath the deltoid and in the subacromial area.  There have been previous ruptures in the long head biceps tendon.  There was a remnant of subscapularis, but as this was released from the lesser tuberosity it was clearly just a thin veil of scar tissue, it did not have any contractile properties.  We divided the capsule tissues from the anterior and inferior margins of the humeral neck delivering the humeral head through the wound.  We outlined our proposed humeral head resection using an oscillating saw at the native retroversion of approximately 20 degrees and then removed osteophytes from the margin of the humeral neck.  Metal cap placed over the cut surface of the proximal humerus.  Then, we exposed the glenoid  with combination of Fukuda, pitchfork, and snake tongue retractors.  I performed a circumferential labral resection and then placed a guidepin into the center of the glenoid and reamed the glenoid with our central and peripheral reamers creating a bony bed for the baseplate.  The baseplate was then impacted with a central lag screw and the inferior and superior locking screws, all of which obtained excellent fit and fixation.  We attempted to place a glenosphere at this point, but there was restriction on  overall mobility so at this point, we then returned our attention back to the proximal humerus where we hand reamed the canal up to size 6, broached to size 6, which matched the opposite side and this showed excellent fit.  The proximal metaphysis was then reamed with the posterior offset reamer.  At this point, then we went ahead and removed the trial from the humerus, used the additional space derived from the metaphyseal preparation of the proximal humerus and this allowed [place] to then easily fit the glenosphere onto the baseplate and this was then impacted and stability was confirmed.  We then returned our attention back to the humerus where our trial was assembled and this showed good soft tissue balance.  At this point, the trial was then removed.  We assembled the final implant size 6 posterior offset metaphysis.  This was assembled on the back table and impacted and then we performed a series of trial reductions and the +6 polyethylene insert showed the best soft tissue balance and good stability.  At this point, the final polyethylene insert was impacted.  Final reduction was performed.  I was very pleased with the soft tissue balance with excellent stability and good motion. The subscapularis was nonfunctional, so it was not repaired.  We irrigated the wound.  Hemostasis was obtained.  The deltopectoral interval was then reapproximated with a series of figure-of-eight #1 Vicryl sutures, 2-0 Monocryl used for the subcutaneous layer, intracuticular 3-0 Monocryl for the skin, followed by Dermabond and Aquacel dressing.  Right arm was placed in a sling.  The patient was awakened, extubated, and taken to the recovery room in stable condition.  Dull A. Shuford, P.A.-C., was used as an Environmental consultant throughout the case, essential for help with positioning the patient, positioning the extremity, tissue manipulation, implantation of prosthesis, wound closure, and intraoperative decision  making.     Metta Clines. Bryam Taborda, M.D.     KMS/MEDQ  D:  06/26/2017  T:  06/26/2017  Job:  [id number]

## 2017-06-27 NOTE — Discharge Summary (Signed)
PATIENT ID:      Donna Moon  MRN:     [id number] DOB/AGE:    [birth year] / 67 y.o.     DISCHARGE SUMMARY  ADMISSION DATE:    06/26/2017 DISCHARGE DATE:    ADMISSION DIAGNOSIS: Right shoulder rotator cuff tear arthropathy Past Medical History:  Diagnosis Date  . Arthritis   . Asthma   . Atrial fibrillation ([place])   . Blood transfusion without reported diagnosis    as a child--age 67  . Cancer [facility])    Mohs surgery for skin cancer on right side of nose   . Dizziness   . Dyspareunia   . Dysrhythmia    atrial fib  . Heart murmur   . Hematuria    hx of has been worked up and no problems   . Hx of seasonal allergies    tx. Allegra, Flonase  . Obesity   . Osteoporosis   . Pneumonia    hx of   . PONV (postoperative nausea and vomiting)    however no problems with past surgeries here.    DISCHARGE DIAGNOSIS:   Active Problems:   S/p reverse total shoulder arthroplasty   PROCEDURE: Procedure(s): RIGHT REVERSE SHOULDER ARTHROPLASTY on 06/26/2017  CONSULTS:    HISTORY:  See H&P in chart.  HOSPITAL COURSE:  Donna Moon is a 67 y.o. admitted on 06/26/2017 with a diagnosis of Right shoulder rotator cuff tear arthropathy.  They were brought to the operating room on 06/26/2017 and underwent Procedure(s): RIGHT REVERSE SHOULDER ARTHROPLASTY.    They were given perioperative antibiotics:  Anti-infectives (From admission, onward)   Start     Dose/Rate Route Frequency Ordered Stop   06/26/17 1200  ceFAZolin (ANCEF) IVPB 2g/100 mL premix     2 g 200 mL/hr over 30 Minutes Intravenous To ShortStay Surgical 06/25/17 0928 06/26/17 1330    .  Patient underwent the above named procedure and tolerated it well. The following day they were hemodynamically stable and pain was controlled on oral analgesics. They were neurovascularly intact to the operative extremity. She was having mild asthma flare so breathing treatment performed, she was sating well and minimal wheezes.  OT was ordered and worked  with patient per protocol. They were medically and orthopaedically stable for discharge on day following successful breathing treatment.    DIAGNOSTIC STUDIES:  RECENT RADIOGRAPHIC STUDIES :  No results found.  RECENT VITAL SIGNS:   Patient Vitals for the past 24 hrs:  BP Temp Temp src Pulse Resp SpO2 Height Weight  06/27/17 0817 - - - - - 96 % - -  06/27/17 0521 (!) 110/54 97.9 F (36.6 C) Oral 64 16 97 % - -  06/26/17 2145 (!) 125/53 97.7 F (36.5 C) Oral 66 18 94 % - -  06/26/17 1721 133/65 (!) 97.2 F (36.2 C) Axillary 61 14 96 % - -  06/26/17 1656 - (!) 97.4 F (36.3 C) - 62 17 93 % - -  06/26/17 1647 111/64 - - - - - - -  06/26/17 1630 (!) 117/55 - - 61 16 93 % - -  06/26/17 1615 - - - 61 15 92 % - -  06/26/17 1605 (!) 115/53 - - - - - - -  06/26/17 1548 93/65 - - 61 19 98 % - -  06/26/17 1538 - - - (!) 59 19 97 % - -  06/26/17 1532 114/60 - - - - - - -  06/26/17 1515 (!) 112/59  98.1 F (36.7 C) - (!) 58 14 96 % - -  06/26/17 1240 129/63 - - (!) 58 16 98 % - -  06/26/17 1230 - - - (!) 59 14 95 % - -  06/26/17 1123 (!) 167/73 98.1 F (36.7 C) Oral (!) 59 18 100 % 5\' 8"  (1.727 m) 112.9 kg (248 lb 14.4 oz)  .  RECENT EKG RESULTS:    Orders placed or performed in visit on 06/06/17  . EKG 12-Lead    DISCHARGE INSTRUCTIONS:    DISCHARGE MEDICATIONS:   Allergies as of 06/27/2017      Reactions   Lexapro [escitalopram Oxalate] Other (See Comments)   Flu like symptoms   Xarelto [rivaroxaban] Itching      Medication List    TAKE these medications   acetaminophen 500 MG tablet Commonly known as:  TYLENOL Take 1,000 mg by mouth daily as needed for moderate pain or headache.   albuterol (2.5 MG/3ML) 0.083% nebulizer solution Commonly known as:  PROVENTIL Take 2.5 mg by nebulization every 6 (six) hours as needed for wheezing or shortness of breath.   albuterol 108 (90 Base) MCG/ACT inhaler Commonly known as:  PROVENTIL HFA;VENTOLIN HFA Inhale 2 puffs into the  lungs every 6 (six) hours as needed for wheezing.   amoxicillin 500 MG capsule Commonly known as:  AMOXIL Take 2,000 mg by mouth See admin instructions. Take 4 capsules (2000 mg) by mouth 1 hour prior to dental procedures.   aspirin EC 81 MG tablet Take 81 mg by mouth daily.   CALCIUM 600+D3 PO Take 1 tablet by mouth daily.   fluticasone 50 MCG/ACT nasal spray Commonly known as:  FLONASE Place 2 sprays into both nostrils daily at 6 (six) AM.   ibuprofen 800 MG tablet Commonly known as:  ADVIL,MOTRIN Take 800 mg by mouth daily as needed (for pain).   lansoprazole 30 MG capsule Commonly known as:  PREVACID Take 30 mg by mouth daily before breakfast.   loratadine 10 MG tablet Commonly known as:  CLARITIN Take 10 mg by mouth at bedtime.   methocarbamol 500 MG tablet Commonly known as:  ROBAXIN Take 1 tablet (500 mg total) by mouth every 8 (eight) hours as needed for muscle spasms.   metoprolol tartrate 25 MG tablet Commonly known as:  LOPRESSOR Take 1 tablet (25 mg total) by mouth every morning AND 2 tablets (50 mg total) every evening.   oxyCODONE-acetaminophen 5-325 MG tablet Commonly known as:  PERCOCET Take 1 tablet by mouth every 4 (four) hours as needed.   PROBIOTIC PO Take 1 capsule by mouth daily.   propafenone 150 MG tablet Commonly known as:  RYTHMOL Take 1 tablet (150 mg total) by mouth every 8 (eight) hours.       FOLLOW UP VISIT:   Follow-up Information    Justice Britain, MD.   Specialty:  Orthopedic Surgery Why:  call to be seen in 10-14 days Contact information: [address] [place]  [address] [phone]           DISCHARGE TO: Home   DISCHARGE CONDITION:  Thereasa Parkin Allenmichael Mcpartlin for Dr. Justice Britain 06/27/2017, 8:21 AM

## 2017-06-27 NOTE — Evaluation (Signed)
Occupational Therapy Evaluation Patient Details Name: Donna Moon MRN: [id number] DOB: [birth year] Today's Date: 06/27/2017    History of Present Illness 67 yo female s/p R Reverse TSA   Past Medical History:  Diagnosis Date  . Arthritis   . Asthma   . Atrial fibrillation ([place])   . Blood transfusion without reported diagnosis    as a child--age 68  . Cancer [facility])    Mohs surgery for skin cancer on right side of nose   . Dizziness   . Dyspareunia   . Dysrhythmia    atrial fib  . Heart murmur   . Hematuria    hx of has been worked up and no problems   . Hx of seasonal allergies    tx. Allegra, Flonase  . Obesity   . Osteoporosis   . Pneumonia    hx of   . PONV (postoperative nausea and vomiting)    however no problems with past surgeries here.      Clinical Impression   Patient evaluated by Occupational Therapy with no further acute OT needs identified. All education has been completed and the patient has no further questions. See below for any follow-up Occupational Therapy or equipment needs. OT to sign off. Thank you for referral.      Follow Up Recommendations  No OT follow up    Equipment Recommendations  None recommended by OT    Recommendations for Other Services       Precautions / Restrictions Precautions Precautions: Shoulder Type of Shoulder Precautions: conservative protocol: ER 20 abduction 45 and FF 60 for adls only Shoulder Interventions: Off for dressing/bathing/exercises Precaution Comments: handout provided and reviewed for R UE shoulder Required Braces or Orthoses: Sling Restrictions Weight Bearing Restrictions: Yes RUE Weight Bearing: Non weight bearing      Mobility Bed Mobility               General bed mobility comments: oob to chair on arrival  Transfers Overall transfer level: Needs assistance   Transfers: Sit to/from Stand Sit to Stand: Supervision              Balance                                           ADL either performed or assessed with clinical judgement   ADL Overall ADL's : Needs assistance/impaired Eating/Feeding: Set up   Grooming: Set up   Upper Body Bathing: Minimal assistance Upper Body Bathing Details (indicate cue type and reason): spouse will (A)  Lower Body Bathing: Minimal assistance Lower Body Bathing Details (indicate cue type and reason): spouse will (A)  Upper Body Dressing : Minimal assistance Upper Body Dressing Details (indicate cue type and reason): dressed patient for home Lower Body Dressing: Minimal assistance Lower Body Dressing Details (indicate cue type and reason): don LB clothing for home Toilet Transfer: Supervision/safety           Functional mobility during ADLs: Supervision/safety General ADL Comments: pt able to complete exercises in standing      Vision         Perception     Praxis      Pertinent Vitals/Pain Pain Assessment: No/denies pain(block still in place)     Hand Dominance Right   Extremity/Trunk Assessment Upper Extremity Assessment Upper Extremity Assessment: RUE deficits/detail RUE Deficits / Details: s/p surg/ nerve block  still in place. pt unable to flex digits 1 and 2. pt able to flex 3 4 5  at this time to make a fist    Lower Extremity Assessment Lower Extremity Assessment: Overall WFL for tasks assessed   Cervical / Trunk Assessment Cervical / Trunk Assessment: Normal   Communication Communication Communication: No difficulties   Cognition Arousal/Alertness: Awake/alert Behavior During Therapy: WFL for tasks assessed/performed Overall Cognitive Status: Within Functional Limits for tasks assessed                                     General Comments  dressing dry and intact. educate d on washing around dressing    Exercises Exercises: Shoulder Shoulder Exercises Pendulum Exercise: Right;10 reps;Standing   Shoulder Instructions Shoulder Instructions Donning/doffing  shirt without moving shoulder: Caregiver independent with task;Patient able to independently direct caregiver Method for sponge bathing under operated UE: Caregiver independent with task;Patient able to independently direct caregiver Donning/doffing sling/immobilizer: Caregiver independent with task;Patient able to independently direct caregiver Correct positioning of sling/immobilizer: Caregiver independent with task;Patient able to independently direct caregiver Pendulum exercises (written home exercise program): Caregiver independent with task;Patient able to independently direct caregiver ROM for elbow, wrist and digits of operated UE: Caregiver independent with task;Patient able to independently direct caregiver Sling wearing schedule (on at all times/off for ADL's): Caregiver independent with task;Patient able to independently direct caregiver Proper positioning of operated UE when showering: Caregiver independent with task Positioning of UE while sleeping: Caregiver independent with task;Patient able to independently direct caregiver    Home Living Family/patient expects to be discharged to:: Private residence Living Arrangements: Spouse/significant other Available Help at Discharge: Family Type of Home: House Home Access: Stairs to enter Technical brewer of Steps: 3 Entrance Stairs-Rails: Left Home Layout: One level     Bathroom Shower/Tub: Occupational psychologist: Handicapped height     Home Equipment: Reese - built in;Grab bars - toilet          Prior Functioning/Environment Level of Independence: Independent                 OT Problem List:        OT Treatment/Interventions:      OT Goals(Current goals can be found in the care plan section)    OT Frequency:     Barriers to D/C:            Co-evaluation              AM-PAC PT "6 Clicks" Daily Activity     Outcome Measure Help from another person eating meals?: None Help  from another person taking care of personal grooming?: A Little Help from another person toileting, which includes using toliet, bedpan, or urinal?: A Little Help from another person bathing (including washing, rinsing, drying)?: A Little Help from another person to put on and taking off regular upper body clothing?: A Little Help from another person to put on and taking off regular lower body clothing?: A Little 6 Click Score: 19   End of Session Nurse Communication: Mobility status;Precautions  Activity Tolerance: Patient tolerated treatment well Patient left: in chair;with call bell/phone within reach;with family/visitor present  OT Visit Diagnosis: Unsteadiness on feet (R26.81)                Time: 7062-3762 OT Time Calculation (min): 16 min Charges:  OT General Charges $OT Visit: 1  Visit OT Evaluation $OT Eval Moderate Complexity: 1 Mod G-Codes:      Jeri Modena   OTR/L Pager: [phone] Office: [phone] .   Parke Poisson B 06/27/2017, 12:53 PM

## 2017-06-27 NOTE — Progress Notes (Signed)
Patient for d/c home accompanied by her husband with no apparent distress noted. Denies any shortness of breath. Medications and discharge instructions explained to the patient. She verbalized understanding. Copies given to her. IV saline lock removed.

## 2017-07-03 ENCOUNTER — Ambulatory Visit: Payer: Medicare Other | Admitting: Obstetrics and Gynecology

## 2017-07-03 ENCOUNTER — Other Ambulatory Visit: Payer: Self-pay

## 2017-07-03 ENCOUNTER — Other Ambulatory Visit (HOSPITAL_COMMUNITY)
Admission: RE | Admit: 2017-07-03 | Discharge: 2017-07-03 | Disposition: A | Discharge status: Home / Self Care | Payer: Medicare Other | Source: Ambulatory Visit | Attending: Obstetrics and Gynecology | Admitting: Obstetrics and Gynecology

## 2017-07-03 ENCOUNTER — Encounter: Payer: Self-pay | Admitting: Obstetrics and Gynecology

## 2017-07-03 VITALS — BP 134/82 | HR 72 | Resp 14 | Ht 66.5 in | Wt 246.5 lb

## 2017-07-03 DIAGNOSIS — Z01419 Encounter for gynecological examination (general) (routine) without abnormal findings: Secondary | ICD-10-CM | POA: Insufficient documentation

## 2017-07-03 NOTE — Progress Notes (Signed)
67 y.o. G35P1001 Married Caucasian female here for annual exam.    Still having vaginal tightness and dryness.  Has done cooking oils.   Has vaginal spotting with intercourse for 6 years.  Otherwise has no bleeding.   Had a right shoulder replacement recently.  Wearing a sling.   Labs with PCP and recent preop.  PCP:   Burman Freestone, NP  Rheumatologist:  Dr. Marijean Bravo will her her new provider.   Patient's last menstrual period was 03/18/2001.           Sexually active: Yes.    The current method of family planning is post menopausal status.    Exercising: Yes.    aerobic classes Smoker:  Former  Health Maintenance: Pap:  07/03/16 Pap smear normal History of abnormal Pap:  No, She did have a conization but is not sure of the details of this  MMG:  10/03/16 BIRADS 1 negative Colonoscopy:  2009 normal in [place]. Saw Dr. Carlean Purl 2016 and was advised next colonoscopy due 2019.   BMD:   2016  Result  Radiologist unable to perform--patient has multiple fractures and several hip/knee replacements. TDaP:  2014.  See office note from 06/10/13. Gardasil:   no HIV: 06/28/15 negative Hep C: 06/28/15 negative  Screening Labs:  Hb today: done recently, Urine today: not collected    reports that she quit smoking about 31 years ago. Her smoking use included cigarettes. She has a 4.50 pack-year smoking history. She has never used smokeless tobacco. She reports that she does not drink alcohol or use drugs.  Past Medical History:  Diagnosis Date  . Arthritis   . Asthma   . Atrial fibrillation ([place])   . Blood transfusion without reported diagnosis    as a child--age 68  . Cancer [facility])    Mohs surgery for skin cancer on right side of nose   . Dizziness   . Dyspareunia   . Dysrhythmia    atrial fib  . Heart murmur   . Hematuria    hx of has been worked up and no problems   . Hx of seasonal allergies    tx. Allegra, Flonase  . Obesity   . Osteoporosis   . Pneumonia    hx of   . PONV  (postoperative nausea and vomiting)    however no problems with past surgeries here.    Past Surgical History:  Procedure Laterality Date  . APPENDECTOMY  1968  . CARDIAC ELECTROPHYSIOLOGY STUDY AND ABLATION  1997 & 2011   x 2  . Carpel Tunnel Bilateral 1993  . CERVICAL CONE BIOPSY     laparoscopic BSO--endometrioma  . CESAREAN SECTION  1986  . COLONOSCOPY  2009   Dr. Algis Greenhouse [place]  . LEFT HEART CATH AND CORONARY ANGIOGRAPHY N/A 07/29/2016   Procedure: Left Heart Cath and Coronary Angiography;  Surgeon: Martinique, Peter M, MD;  Location: [facility];  Service: Cardiovascular;  Laterality: N/A;  . OOPHORECTOMY  2009  . REVERSE SHOULDER ARTHROPLASTY Left 02/29/2016   Procedure: REVERSE SHOULDER ARTHROPLASTY;  Surgeon: Justice Britain, MD;  Location: [facility];  Service: Orthopedics;  Laterality: Left;  . REVERSE SHOULDER ARTHROPLASTY Right 06/26/2017   Procedure: RIGHT REVERSE SHOULDER ARTHROPLASTY;  Surgeon: Justice Britain, MD;  Location: [facility];  Service: Orthopedics;  Laterality: Right;  . ROTATOR CUFF REPAIR Right 1999   x2 -'97, 99  . TOTAL HIP ARTHROPLASTY  12/27/2010   x2 replacements, x1 repair.  Marland Kitchen TOTAL HIP ARTHROPLASTY Right 03/08/2015  Procedure: TOTAL RIGHT  HIP ARTHROPLASTY ANTERIOR APPROACH;  Surgeon: Gaynelle Arabian, MD;  Location: [facility];  Service: Orthopedics;  Laterality: Right;  . TOTAL KNEE ARTHROPLASTY Left 12/07/2012   Procedure: LEFT TOTAL KNEE ARTHROPLASTY;  Surgeon: Gearlean Alf, MD;  Location: [facility];  Service: Orthopedics;  Laterality: Left;  . UPPER GASTROINTESTINAL ENDOSCOPY  2009   Shiflett, NL    Current Outpatient Medications  Medication Sig Dispense Refill  . acetaminophen (TYLENOL) 500 MG tablet Take 1,000 mg by mouth daily as needed for moderate pain or headache.    . albuterol (PROVENTIL HFA;VENTOLIN HFA) 108 (90 BASE) MCG/ACT inhaler Inhale 2 puffs into the lungs every 6 (six) hours as needed for wheezing.    Marland Kitchen albuterol (PROVENTIL) (2.5 MG/3ML) 0.083%  nebulizer solution Take 2.5 mg by nebulization every 6 (six) hours as needed for wheezing or shortness of breath.    Marland Kitchen amoxicillin (AMOXIL) 500 MG capsule Take 2,000 mg by mouth See admin instructions. Take 4 capsules (2000 mg) by mouth 1 hour prior to dental procedures.    Marland Kitchen aspirin EC 81 MG tablet Take 81 mg by mouth daily.     . Calcium Carb-Cholecalciferol (CALCIUM 600+D3 PO) Take 1 tablet by mouth daily.    . fluticasone (FLONASE) 50 MCG/ACT nasal spray Place 2 sprays into both nostrils daily at 6 (six) AM.     . ibuprofen (ADVIL,MOTRIN) 800 MG tablet Take 800 mg by mouth daily as needed (for pain).    Marland Kitchen lansoprazole (PREVACID) 30 MG capsule Take 30 mg by mouth daily before breakfast.    . loratadine (CLARITIN) 10 MG tablet Take 10 mg by mouth at bedtime.     . methocarbamol (ROBAXIN) 500 MG tablet Take 1 tablet (500 mg total) by mouth every 8 (eight) hours as needed for muscle spasms. 40 tablet 1  . metoprolol tartrate (LOPRESSOR) 25 MG tablet Take 1 tablet (25 mg total) by mouth every morning AND 2 tablets (50 mg total) every evening. 270 tablet 3  . Probiotic Product (PROBIOTIC PO) Take 1 capsule by mouth daily.     . propafenone (RYTHMOL) 150 MG tablet Take 1 tablet (150 mg total) by mouth every 8 (eight) hours. 270 tablet 3   No current facility-administered medications for this visit.     Family History  Problem Relation Age of Onset  . Stroke Mother   . Ovarian cancer Mother 66       dec uterine or ovarian ca  . Ulcerative colitis Mother   . Kidney disease Mother   . Heart attack Father   . Heart attack Brother   . Diabetes Sister        AODM  . Diabetes Sister        AODM  . Irritable bowel syndrome Sister   . Cancer Sister        kidney cancer  . Diabetes Maternal Grandmother   . Diabetes Paternal Grandmother   . Lupus Sister        dec age 3 complications from Lupus    Review of Systems  Constitutional: Negative.   HENT: Negative.   Eyes: Negative.    Respiratory: Negative.   Cardiovascular: Negative.   Gastrointestinal: Negative.   Endocrine: Negative.   Genitourinary: Negative.   Musculoskeletal: Negative.   Skin: Negative.   Allergic/Immunologic: Negative.   Neurological: Negative.   Hematological: Negative.   Psychiatric/Behavioral: Negative.     Exam:   BP 134/82 (BP Location: Left Arm, Patient Position: Sitting,  Cuff Size: Normal)   Pulse 72   Resp 14   Ht 5' 6.5" (1.689 m)   Wt 246 lb 8 oz (111.8 kg)   LMP 03/18/2001   BMI 39.19 kg/m     General appearance: alert, cooperative and appears stated age Head: Normocephalic, without obvious abnormality, atraumatic Neck: no adenopathy, supple, symmetrical, trachea midline and thyroid normal to inspection and palpation Lungs: clear to auscultation bilaterally Breasts: normal appearance, no masses or tenderness, No nipple retraction or dimpling, No nipple discharge or bleeding, No axillary or supraclavicular adenopathy Heart: regular rate and rhythm Abdomen: soft, non-tender; no masses, no organomegaly Extremities: extremities normal, atraumatic, no cyanosis or edema. Right arm in sling.  Skin: Skin color, texture, turgor normal. No rashes or lesions Lymph nodes: Cervical, supraclavicular, and axillary nodes normal. No abnormal inguinal nodes palpated Neurologic: Grossly normal  Pelvic: External genitalia:  no lesions              Urethra:  normal appearing urethra with no masses, tenderness or lesions              Bartholins and Skenes: normal                 Vagina: normal appearing vagina with normal color and discharge, no lesions.  Vaginal atrophy noted.              Cervix: no lesions              Pap taken: Yes.   Bimanual Exam:  Uterus:  normal size, contour, position, consistency, mobility, non-tender              Adnexa: no mass, fullness, tenderness              Rectal exam: Yes.  .  Confirms.              Anus:  normal sphincter tone, no lesions  Chaperone  was present for exam.  Assessment:   Well woman visit with normal exam. Hx cervical cone biopsy.  Hx lap BSO - endometrioma. Vaginal atrophy.  Declines medication.  Hx atrial fibrillation.  FH ovarian/uterine cancer.  Osteoporosis? Elevated blood sugar on recent preop labs.  Plan: Mammogram screening yearly. Recommended self breast awareness. Pap and HR HPV performed. Guidelines for Calcium, Vitamin D, regular exercise program including cardiovascular and weight bearing exercise. Colonoscopy this year.  I recommend she see her PCP in follow up to the elevated glucose.  She will check her blood sugar at home.  Follow up annually and prn.   After visit summary provided.

## 2017-07-03 NOTE — Patient Instructions (Signed)

## 2017-07-09 LAB — CYTOLOGY - PAP: DIAGNOSIS: NEGATIVE

## 2017-07-20 NOTE — Anesthesia Postprocedure Evaluation (Signed)
Anesthesia Post Note  Patient: Donna Moon  Procedure(s) Performed: RIGHT REVERSE SHOULDER ARTHROPLASTY (Right Shoulder)     Patient location during evaluation: PACU Anesthesia Type: General and Regional Level of consciousness: awake and alert Pain management: pain level controlled Vital Signs Assessment: post-procedure vital signs reviewed and stable Respiratory status: spontaneous breathing, nonlabored ventilation, respiratory function stable and patient connected to nasal cannula oxygen Cardiovascular status: blood pressure returned to baseline and stable Postop Assessment: no apparent nausea or vomiting Anesthetic complications: no    Last Vitals:  Vitals:   06/27/17 0942 06/27/17 1100  BP: (!) 118/58   Pulse: 64   Resp: 16   Temp: 36.7 C   SpO2: 96% 97%    Last Pain:  Vitals:   06/27/17 1153  TempSrc:   PainSc: 2                  Krystyne Tewksbury,JAMES TERRILL

## 2017-10-20 ENCOUNTER — Telehealth: Payer: Self-pay | Admitting: *Deleted

## 2017-10-20 NOTE — Telephone Encounter (Signed)
   [facility] Pre-operative Risk Assessment    Request for surgical clearance:  1. What type of surgery is being performed? RIGHT ULNA NERVE DECOMPRESSION    2. When is this surgery scheduled? 12/16/17 AT 7:30 AM  3. What type of clearance is required (medical clearance vs. Pharmacy clearance to hold med vs. Both)?  BOTH ( REQUESTING PCP/SPECIALIST--- GEN PHY , EKG CBC ,BMP,ALBUMIN HBAIC, U/A )  4. Are there any medications that need to be held prior to surgery and how long? ASPIRIN  5. Practice name and name of physician performing surgery?  [facility]  ; DR GRAMIG   6. What is your office phone number [phone]   7.   What is your office fax number [phone] ATTN SHERRY WILLS.     8.   Anesthesia type (None, local, MAC, general) ? GENERAL    Donna Moon 10/20/2017, 5:46 PM  _________________________________________________________________   (provider comments below)

## 2017-10-21 ENCOUNTER — Encounter: Payer: Self-pay | Admitting: Cardiovascular Disease

## 2017-10-27 NOTE — Telephone Encounter (Signed)
Routing to Dr. Sallyanne Kuster regarding holding aspirin.  Patient's procedure not until 12/16/2017  Will need phone call prior to recheck status.  Dr. Loletha Grayer - please route your reply back to the CV DIV PREOP pool.  Thanks.  Burtis Junes, RN, [place] [address] [place], [place]  [address] [phone]

## 2017-10-27 NOTE — Telephone Encounter (Signed)
OK to temporarily hold ASA (5 days) if necessary for surgery

## 2017-10-29 NOTE — Telephone Encounter (Signed)
Follow up ° ° ° °Patient is returning call in reference to pre-op clearance.  °

## 2017-10-29 NOTE — Telephone Encounter (Signed)
Called patient back and informed her of Dr Croitoru's recommendations and she voiced understanding. She requested that I fax a form back to [facility]. Checked with triage and the form was sent to be scanned in.   Cardiac clearance for Aspirin sent [facility].

## 2017-11-03 ENCOUNTER — Encounter: Payer: Self-pay | Admitting: Internal Medicine

## 2017-11-03 NOTE — Telephone Encounter (Signed)
   Primary Cardiologist: Sanda Klein, MD  Chart reviewed as part of pre-operative protocol coverage. H/o PAF, arthritis, asthma, obesity, normal coronary angiogram 07/2016. Truitt Merle tried to contact pt last week but was unsuccessful. She recommended patient be contacted by phone to recheck status.  Dr. Sallyanne Kuster has stated it is "OK to temporarily hold ASA (5 days) if necessary for surgery." I see Dr. Victorino December aspirin recommendation was faxed already, but as above patient required a phone call prior to formally clearing. I spoke with patient and she confirms there have been no new cardiac symptoms. Therefore, given past medical history and time since last visit, based on ACC/AHA guidelines, Donna Moon would be at acceptable risk for the planned procedure without further cardiovascular testing.   I will route this recommendation to the requesting party via Epic fax function and remove from pre-op pool.  Please call with questions.  Charlie Pitter, PA-C 11/03/2017, 2:04 PM

## 2017-11-04 NOTE — Telephone Encounter (Signed)
I'm not sure why this was sent me. I'm not in pre op clearance today. I will forward to the preop callback pool.

## 2018-01-13 ENCOUNTER — Encounter: Payer: Self-pay | Admitting: Internal Medicine

## 2018-02-16 ENCOUNTER — Encounter: Payer: Self-pay | Admitting: Internal Medicine

## 2018-02-16 ENCOUNTER — Ambulatory Visit (AMBULATORY_SURGERY_CENTER): Payer: Self-pay

## 2018-02-16 VITALS — Ht 68.0 in | Wt 248.0 lb

## 2018-02-16 DIAGNOSIS — Z1211 Encounter for screening for malignant neoplasm of colon: Secondary | ICD-10-CM

## 2018-02-16 NOTE — Progress Notes (Signed)
Denies allergies to eggs or soy products. Denies complication of anesthesia or sedation. Denies use of weight loss medication. Denies use of O2.   Emmi instructions declined.  

## 2018-03-02 ENCOUNTER — Ambulatory Visit (AMBULATORY_SURGERY_CENTER): Payer: Medicare Other | Admitting: Internal Medicine

## 2018-03-02 ENCOUNTER — Encounter: Payer: Self-pay | Admitting: Internal Medicine

## 2018-03-02 VITALS — BP 103/63 | HR 60 | Temp 96.8°F | Resp 19 | Ht 66.0 in | Wt 246.0 lb

## 2018-03-02 DIAGNOSIS — Z1211 Encounter for screening for malignant neoplasm of colon: Secondary | ICD-10-CM

## 2018-03-02 DIAGNOSIS — D122 Benign neoplasm of ascending colon: Secondary | ICD-10-CM | POA: Diagnosis not present

## 2018-03-02 MED ORDER — SODIUM CHLORIDE 0.9 % IV SOLN: 500.0000 mL | Freq: Once | INTRAVENOUS | Status: DC

## 2018-03-02 NOTE — Patient Instructions (Addendum)
I found and removed 2 tiny polyps. I will let you know pathology results and when to have another routine colonoscopy by mail and/or My Chart.  You also have a condition called diverticulosis - common and not usually a problem. Please read the handout provided.  I appreciate the opportunity to care for you. Gatha Mayer, MD, [facility]  Polyp handout given to patient. Diverticulosis handout given to patient.  YOU HAD AN ENDOSCOPIC PROCEDURE TODAY AT [facility]:   Refer to the procedure report that was given to you for any specific questions about what was found during the examination.  If the procedure report does not answer your questions, please call your gastroenterologist to clarify.  If you requested that your care partner not be given the details of your procedure findings, then the procedure report has been included in a sealed envelope for you to review at your convenience later.  YOU SHOULD EXPECT: Some feelings of bloating in the abdomen. Passage of more gas than usual.  Walking can help get rid of the air that was put into your GI tract during the procedure and reduce the bloating. If you had a lower endoscopy (such as a colonoscopy or flexible sigmoidoscopy) you may notice spotting of blood in your stool or on the toilet paper. If you underwent a bowel prep for your procedure, you may not have a normal bowel movement for a few days.  Please Note:  You might notice some irritation and congestion in your nose or some drainage.  This is from the oxygen used during your procedure.  There is no need for concern and it should clear up in a day or so.  SYMPTOMS TO REPORT IMMEDIATELY:   Following lower endoscopy (colonoscopy or flexible sigmoidoscopy):  Excessive amounts of blood in the stool  Significant tenderness or worsening of abdominal pains  Swelling of the abdomen that is new, acute  Fever of 100F or higher For urgent or emergent issues, a  gastroenterologist can be reached at any hour by calling [phone].   DIET:  We do recommend a small meal at first, but then you may proceed to your regular diet.  Drink plenty of fluids but you should avoid alcoholic beverages for 24 hours.  ACTIVITY:  You should plan to take it easy for the rest of today and you should NOT DRIVE or use heavy machinery until tomorrow (because of the sedation medicines used during the test).    FOLLOW UP: Our staff will call the number listed on your records the next business day following your procedure to check on you and address any questions or concerns that you may have regarding the information given to you following your procedure. If we do not reach you, we will leave a message.  However, if you are feeling well and you are not experiencing any problems, there is no need to return our call.  We will assume that you have returned to your regular daily activities without incident.  If any biopsies were taken you will be contacted by phone or by letter within the next 1-3 weeks.  Please call us at [phone] if you have not heard about the biopsies in 3 weeks.    SIGNATURES/CONFIDENTIALITY: You and/or your care partner have signed paperwork which will be entered into your electronic medical record.  These signatures attest to the fact that that the information above on your After Visit Summary has been reviewed and is understood.  Full responsibility of the confidentiality of this discharge information lies with you and/or your care-partner.

## 2018-03-02 NOTE — Op Note (Signed)
[facility] Patient Name: Donna Moon Procedure Date: 03/02/2018 11:02 AM MRN: [id number] Endoscopist: Gatha Mayer , MD Age: 67 Referring MD:  Date of Birth: Aug 14, 1950 Gender: Female Account #: [id number] Procedure:                Colonoscopy Indications:              Screening for colorectal malignant neoplasm, Last                            colonoscopy: 2009 Medicines:                Propofol per Anesthesia, Monitored Anesthesia Care Procedure:                Pre-Anesthesia Assessment:                           - Prior to the procedure, a History and Physical                            was performed, and patient medications and                            allergies were reviewed. The patient's tolerance of                            previous anesthesia was also reviewed. The risks                            and benefits of the procedure and the sedation                            options and risks were discussed with the patient.                            All questions were answered, and informed consent                            was obtained. Prior Anticoagulants: The patient has                            taken no previous anticoagulant or antiplatelet                            agents. ASA Grade Assessment: II - A patient with                            mild systemic disease. After reviewing the risks                            and benefits, the patient was deemed in                            satisfactory condition to undergo the procedure.  After obtaining informed consent, the colonoscope                            was passed under direct vision. Throughout the                            procedure, the patient's blood pressure, pulse, and                            oxygen saturations were monitored continuously. The                            Colonoscope was introduced through the anus and                            advanced to the the  cecum, identified by                            appendiceal orifice and ileocecal valve. The                            colonoscopy was performed without difficulty. The                            patient tolerated the procedure well. The quality                            of the bowel preparation was adequate. The bowel                            preparation used was Miralax. The ileocecal valve,                            appendiceal orifice, and rectum were photographed. Scope In: 11:13:12 AM Scope Out: 11:28:54 AM Scope Withdrawal Time: 0 hours 12 minutes 21 seconds  Total Procedure Duration: 0 hours 15 minutes 42 seconds  Findings:                 The perianal and digital rectal examinations were                            normal.                           Two sessile polyps were found in the ascending                            colon. The polyps were 1 to 2 mm in size. These                            polyps were removed with a cold biopsy forceps.                            Resection and retrieval were complete. Verification  of patient identification for the specimen was                            done. Estimated blood loss was minimal.                           Diverticula were found in the sigmoid colon.                           The exam was otherwise without abnormality on                            direct and retroflexion views. Complications:            No immediate complications. Estimated Blood Loss:     Estimated blood loss was minimal. Impression:               - Two 1 to 2 mm polyps in the ascending colon,                            removed with a cold biopsy forceps. Resected and                            retrieved.                           - Diverticulosis in the sigmoid colon.                           - The examination was otherwise normal on direct                            and retroflexion views. Recommendation:           - Patient  has a contact number available for                            emergencies. The signs and symptoms of potential                            delayed complications were discussed with the                            patient. Return to normal activities tomorrow.                            Written discharge instructions were provided to the                            patient.                           - Resume previous diet.                           - Continue present medications.                           -  Repeat colonoscopy is recommended. The                            colonoscopy date will be determined after pathology                            results from today's exam become available for                            review. Gatha Mayer, MD 03/02/2018 11:33:35 AM This report has been signed electronically.

## 2018-03-02 NOTE — Progress Notes (Signed)
Called to room to assist during endoscopic procedure.  Patient ID and intended procedure confirmed with present staff. Received instructions for my participation in the procedure from the performing physician.  

## 2018-03-02 NOTE — Progress Notes (Signed)
Report to PACU, RN, vss, BBS= Clear.  

## 2018-03-02 NOTE — Progress Notes (Signed)
Pt's states no medical or surgical changes since previsit or office visit. 

## 2018-03-03 ENCOUNTER — Telehealth: Payer: Self-pay | Admitting: *Deleted

## 2018-03-03 NOTE — Telephone Encounter (Signed)
  Follow up Call-  Call back number 03/02/2018  Post procedure Call Back phone  # [phone]  Permission to leave phone message Yes  Some recent data might be hidden     Patient questions:  Do you have a fever, pain , or abdominal swelling? No. Pain Score  0 *  Have you tolerated food without any problems? Yes.    Have you been able to return to your normal activities? Yes.    Do you have any questions about your discharge instructions: Diet   No. Medications  No. Follow up visit  No.  Do you have questions or concerns about your Care? No.  Actions: * If pain score is 4 or above: No action needed, pain <4.

## 2018-03-06 ENCOUNTER — Encounter: Payer: Self-pay | Admitting: Internal Medicine

## 2018-03-06 DIAGNOSIS — Z86010 Personal history of colonic polyps: Secondary | ICD-10-CM

## 2018-03-06 DIAGNOSIS — Z860101 Personal history of adenomatous and serrated colon polyps: Secondary | ICD-10-CM

## 2018-03-06 HISTORY — DX: Personal history of colonic polyps: Z86.010

## 2018-03-06 HISTORY — DX: Personal history of adenomatous and serrated colon polyps: Z86.0101

## 2018-03-06 NOTE — Progress Notes (Signed)
1 adenoma 1 hyperplastic Adequate prep Recall 2024 My Chart

## 2018-04-30 ENCOUNTER — Ambulatory Visit: Payer: Medicare Other | Admitting: Cardiovascular Disease

## 2018-04-30 ENCOUNTER — Encounter: Payer: Self-pay | Admitting: Cardiovascular Disease

## 2018-04-30 VITALS — BP 148/80 | HR 63 | Ht 68.0 in | Wt 251.4 lb

## 2018-04-30 DIAGNOSIS — R002 Palpitations: Secondary | ICD-10-CM

## 2018-04-30 DIAGNOSIS — I471 Supraventricular tachycardia: Secondary | ICD-10-CM

## 2018-04-30 DIAGNOSIS — Z79899 Other long term (current) drug therapy: Secondary | ICD-10-CM | POA: Diagnosis not present

## 2018-04-30 DIAGNOSIS — Z8679 Personal history of other diseases of the circulatory system: Secondary | ICD-10-CM

## 2018-04-30 DIAGNOSIS — Z6835 Body mass index (BMI) 35.0-35.9, adult: Secondary | ICD-10-CM

## 2018-04-30 DIAGNOSIS — I4719 Other supraventricular tachycardia: Secondary | ICD-10-CM | POA: Insufficient documentation

## 2018-04-30 MED ORDER — PROPAFENONE HCL 150 MG PO TABS: 150.0000 mg | ORAL_TABLET | Freq: Three times a day (TID) | ORAL | 3 refills | Status: DC

## 2018-04-30 MED ORDER — METOPROLOL TARTRATE 25 MG PO TABS: ORAL_TABLET | ORAL | 3 refills | Status: DC

## 2018-04-30 NOTE — Patient Instructions (Addendum)
Medication Instructions:  Not needed If you need a refill on your cardiac medications before your next appointment, please call your pharmacy.   Lab work: Not needed If you have labs (blood work) drawn today and your tests are completely normal, you will receive your results only by: Marland Kitchen MyChart Message (if you have MyChart) OR . A paper copy in the mail If you have any lab test that is abnormal or we need to change your treatment, we will call you to review the results.  Testing/Procedures: Not needed  Follow-Up: At [facility], you and your health needs are our priority.  As part of our continuing mission to provide you with exceptional heart care, we have created designated Provider Care Teams.  These Care Teams include your primary Cardiologist (physician) and Advanced Practice Providers (APPs -  Physician Assistants and Nurse Practitioners) who all work together to provide you with the care you need, when you need it. . Your physician recommends that you schedule a follow-up appointment in 12 months with Dr Sallyanne Kuster .   Any Other Special Instructions Will Be Listed Below (If Applicable).

## 2018-04-30 NOTE — Progress Notes (Signed)
Patient ID: Donna Moon, female   DOB: [birth year], 68 y.o.   MRN: [id number]    Cardiology Office Note    Date:  04/30/2018   12 months or:  Donna Moon, [place] 08-13-1950, MRN [id number]  PCP:  Joyice Faster, FNP  Cardiologist:   Sanda Klein, MD   No chief complaint on file.   History of Present Illness:  Donna Moon is a 68 y.o. female with a history of paroxysmal atrial fibrillation with 2 previous successful ablation procedures and some residual palpitations presumed to be secondary to recurrent paroxysmal atrial arrhythmia. Symptoms are very well controlled on metoprolol and propafenone.  2018 a false positive ECG stress stress related to coronary angiography, with normal results.  Since her ablation really have not documented atrial flutter or atrial fibrillation, her symptoms seem to be related to frequent isolated PACs and very brief bursts of paroxysmal atrial tachycardia.  The patient specifically denies any chest pain at rest exertion, dyspnea at rest or with exertion, orthopnea, paroxysmal nocturnal dyspnea, syncope, focal neurological deficits, intermittent claudication, lower extremity edema, unexplained weight gain, cough, hemoptysis or wheezing.  Palpitations did  bother her more last month when she had strep throat and sinus infection.  She has had a couple of orthopedic surgeries for shoulder replacement and ulnar neuropathy requiring nerve release, no rhythm problems occurred during the perioperative period.  She does not have a history of stroke/TIA and has low embolic risk profile (CHADSvasc 1-2 for age 29 and female gender only).   Past Medical History:  Diagnosis Date  . Allergy   . Arthritis   . Asthma   . Atrial fibrillation ([place])   . Blood transfusion without reported diagnosis    as a child--age 1  . Cancer [facility])    Mohs surgery for skin cancer on right side of nose   . Dizziness   . Dyspareunia   . Dysrhythmia    atrial fib  . Heart murmur   .  Hematuria    hx of has been worked up and no problems   . Hx of adenomatous polyp of colon 03/06/2018  . Hx of seasonal allergies    tx. Allegra, Flonase  . Obesity   . Osteoporosis   . Pneumonia    hx of   . PONV (postoperative nausea and vomiting)    however no problems with past surgeries here.    Past Surgical History:  Procedure Laterality Date  . APPENDECTOMY  1968  . CARDIAC ELECTROPHYSIOLOGY STUDY AND ABLATION  1997 & 2011   x 2  . Carpel Tunnel Bilateral 1993  . CERVICAL CONE BIOPSY     laparoscopic BSO--endometrioma  . CESAREAN SECTION  1986  . COLONOSCOPY  2009   Dr. Algis Greenhouse NL  . LEFT HEART CATH AND CORONARY ANGIOGRAPHY N/A 07/29/2016   Procedure: Left Heart Cath and Coronary Angiography;  Surgeon: Martinique, Peter M, MD;  Location: [place] [facility];  Service: Cardiovascular;  Laterality: N/A;  . OOPHORECTOMY  2009  . REVERSE SHOULDER ARTHROPLASTY Left 02/29/2016   Procedure: REVERSE SHOULDER ARTHROPLASTY;  Surgeon: Justice Britain, MD;  Location: [place];  Service: Orthopedics;  Laterality: Left;  . REVERSE SHOULDER ARTHROPLASTY Right 06/26/2017   Procedure: RIGHT REVERSE SHOULDER ARTHROPLASTY;  Surgeon: Justice Britain, MD;  Location: [place];  Service: Orthopedics;  Laterality: Right;  . ROTATOR CUFF REPAIR Right 1999   x2 -'97, 99  . SUPERFICIAL PERONEAL NERVE RELEASE Right    Right elbow nerve  release  . TOTAL HIP ARTHROPLASTY  12/27/2010   x2 replacements, x1 repair.  Marland Kitchen TOTAL HIP ARTHROPLASTY Right 03/08/2015   Procedure: TOTAL RIGHT  HIP ARTHROPLASTY ANTERIOR APPROACH;  Surgeon: Gaynelle Arabian, MD;  [facility];  Service: Orthopedics;  Laterality: Right;  . TOTAL KNEE ARTHROPLASTY Left 12/07/2012   Procedure: LEFT TOTAL KNEE ARTHROPLASTY;  Surgeon: Gearlean Alf, MD;  [facility];  Service: Orthopedics;  Laterality: Left;  . UPPER GASTROINTESTINAL ENDOSCOPY  2009   Shiflett, NL    Outpatient Medications Prior to Visit  Medication Sig Dispense Refill  .  acetaminophen (TYLENOL) 500 MG tablet Take 1,000 mg by mouth daily as needed for moderate pain or headache.    . albuterol (PROVENTIL HFA;VENTOLIN HFA) 108 (90 BASE) MCG/ACT inhaler Inhale 2 puffs into the lungs every 6 (six) hours as needed for wheezing.    Marland Kitchen albuterol (PROVENTIL) (2.5 MG/3ML) 0.083% nebulizer solution Take 2.5 mg by nebulization every 6 (six) hours as needed for wheezing or shortness of breath.    Marland Kitchen amoxicillin (AMOXIL) 500 MG capsule Take 2,000 mg by mouth See admin instructions. Take 4 capsules (2000 mg) by mouth 1 hour prior to dental procedures.    Marland Kitchen aspirin EC 81 MG tablet Take 81 mg by mouth daily.     . Calcium Carb-Cholecalciferol (CALCIUM 600+D3 PO) Take 1 tablet by mouth daily.    . fexofenadine (ALLEGRA) 180 MG tablet Take 180 mg by mouth daily.    . fluticasone (FLONASE) 50 MCG/ACT nasal spray Place 2 sprays into both nostrils daily at 6 (six) AM.     . ibuprofen (ADVIL,MOTRIN) 800 MG tablet Take 800 mg by mouth daily as needed (for pain).    Marland Kitchen lansoprazole (PREVACID) 30 MG capsule Take 30 mg by mouth daily before breakfast.    . loratadine (CLARITIN) 10 MG tablet Take 10 mg by mouth at bedtime.     . methocarbamol (ROBAXIN) 500 MG tablet Take 1 tablet (500 mg total) by mouth every 8 (eight) hours as needed for muscle spasms. 40 tablet 1  . OVER THE COUNTER MEDICATION Vitamin B 6 100 mg one tablet daily.    Marland Kitchen OVER THE COUNTER MEDICATION Vitamin C 500 mg one tablet daily.    . Probiotic Product (PROBIOTIC PO) Take 1 capsule by mouth daily.     . metoprolol tartrate (LOPRESSOR) 25 MG tablet Take 1 tablet (25 mg total) by mouth every morning AND 2 tablets (50 mg total) every evening. 270 tablet 3  . propafenone (RYTHMOL) 150 MG tablet Take 1 tablet (150 mg total) by mouth every 8 (eight) hours. 270 tablet 3   Facility-Administered Medications Prior to Visit  Medication Dose Route Frequency Provider Last Rate Last Dose  . 0.9 %  sodium chloride infusion  500 mL  Intravenous Once Gatha Mayer, MD         Allergies:   Lexapro [escitalopram oxalate] and Xarelto [rivaroxaban]   Social History   Socioeconomic History  . Marital status: Married    Spouse name: Not on file  . Number of children: 1  . Years of education: Not on file  . Highest education level: Not on file  Occupational History  . Occupation: retired  Scientific laboratory technician  . Financial resource strain: Not on file  . Food insecurity:    Worry: Not on file    Inability: Not on file  . Transportation needs:    Medical: Not on file    Non-medical: Not  on file  Tobacco Use  . Smoking status: Former Smoker    Packs/day: 0.50    Years: 9.00    Pack years: 4.50    Types: Cigarettes    Last attempt to quit: 03/18/1986    Years since quitting: 32.1  . Smokeless tobacco: Never Used  Substance and Sexual Activity  . Alcohol use: No    Alcohol/week: 0.0 standard drinks  . Drug use: No  . Sexual activity: Yes    Partners: Male    Birth control/protection: Post-menopausal  Lifestyle  . Physical activity:    Days per week: Not on file    Minutes per session: Not on file  . Stress: Not on file  Relationships  . Social connections:    Talks on phone: Not on file    Gets together: Not on file    Attends religious service: Not on file    Active member of club or organization: Not on file    Attends meetings of clubs or organizations: Not on file    Relationship status: Not on file  Other Topics Concern  . Not on file  Social History Narrative  . Not on file     Family History:  The patient's family history includes Cancer in her sister; Diabetes in her maternal grandmother, paternal grandmother, sister, and sister; Heart attack in her brother and father; Irritable bowel syndrome in her sister; Kidney disease in her mother; Lupus in her sister; Ovarian cancer (age of onset: 33) in her mother; Stroke in her mother; Ulcerative colitis in her mother.   ROS:   Please see the history of  present illness.    ROS All other systems are reviewed and are negative   PHYSICAL EXAM:   VS:  BP (!) 148/80   Pulse 63   Ht 5\' 8"  (1.727 m)   Wt 251 lb 6.4 oz (114 kg)   LMP 03/18/2001   BMI 38.23 kg/m       General: Alert, oriented x3, no distress, severely obese Head: no evidence of trauma, PERRL, EOMI, no exophtalmos or lid lag, no myxedema, no xanthelasma; normal ears, nose and oropharynx Neck: normal jugular venous pulsations and no hepatojugular reflux; brisk carotid pulses without delay and no carotid bruits Chest: clear to auscultation, no signs of consolidation by percussion or palpation, normal fremitus, symmetrical and full respiratory excursions Cardiovascular: normal position and quality of the apical impulse, regular rhythm, normal first and second heart sounds, no murmurs, rubs or gallops Abdomen: no tenderness or distention, no masses by palpation, no abnormal pulsatility or arterial bruits, normal bowel sounds, no hepatosplenomegaly Extremities: no clubbing, cyanosis or edema; 2+ radial, ulnar and brachial pulses bilaterally; 2+ right femoral, posterior tibial and dorsalis pedis pulses; 2+ left femoral, posterior tibial and dorsalis pedis pulses; no subclavian or femoral bruits Neurological: grossly nonfocal Psych: Normal mood and affect     Wt Readings from Last 3 Encounters:  04/30/18 251 lb 6.4 oz (114 kg)  03/02/18 246 lb (111.6 kg)  02/16/18 248 lb (112.5 kg)      Studies/Labs Reviewed:   EKG:  EKG is ordered today.  The ekg ordered today sinus rhythm, sharp with deep Q waves in leads I, aVL (old finding), no repolarization abnormalities, QTC 442 ms  Recent Labs: 06/20/2017: BUN 16; Creatinine, Ser 1.07; Hemoglobin 12.9; Platelets 192; Potassium 4.6; Sodium 137    Lipid Panel    Component Value Date/Time   CHOL 201 (H) 07/25/2016 0909   TRIG 90  07/25/2016 0909   HDL 65 07/25/2016 0909   CHOLHDL 3.1 07/25/2016 0909   VLDL 18 07/25/2016 0909    LDLCALC 118 (H) 07/25/2016 0909    ASSESSMENT:    1. PAT (paroxysmal atrial tachycardia) ([place])   2. Severe obesity (BMI 35.0-35.9 with comorbidity) ([place])   3. Long term current use of antiarrhythmic drug   4. History of atrial fibrillation   5. Palpitations      PLAN:  In order of problems listed above:   1. PAT/PAF: Her palpitations mostly appear to be isolated, no sustained arrhythmia.  No clinical evidence of recurrent sustained atrial fibrillation since her last ablation, but had subjective palpitations.  These have been well controlled on propafenone and metoprolol.  After turning 65 her embolic risk score has increased by 1 (score 2 for age and gender only), but true atrial fibrillation has not been documented in many years. We'll not prescribe anticoagulation until we have more firm evidence of recurrent atrial fibrillation or atrial flutter.  Asked her to promptly report sustained rhythm changes or any neurological complaints, even if transient. 2. Obesity: Reminded her about the direct relationship between the risk of recurrent arrhythmia and her weight.  Encouraged to lose weight. 3. Propafenone: No evidence of proarrhythmia.  With normal coronary angiography in 2018, unlikely to develop future problems with coronary insufficiency.      Medication Adjustments/Labs and Tests Ordered: Current medicines are reviewed at length with the patient today.  Concerns regarding medicines are outlined above.  Medication changes, Labs and Tests ordered today are listed in the Patient Instructions below. Patient Instructions  Medication Instructions:  Not needed If you need a refill on your cardiac medications before your next appointment, please call your pharmacy.   Lab work: Not needed If you have labs (blood work) drawn today and your tests are completely normal, you will receive your results only by: Marland Kitchen MyChart Message (if you have MyChart) OR . A paper copy in the mail If you  have any lab test that is abnormal or we need to change your treatment, we will call you to review the results.  Testing/Procedures: Not needed  Follow-Up: At [facility], you and your health needs are our priority.  As part of our continuing mission to provide you with exceptional heart care, we have created designated Provider Care Teams.  These Care Teams include your primary Cardiologist (physician) and Advanced Practice Providers (APPs -  Physician Assistants and Nurse Practitioners) who all work together to provide you with the care you need, when you need it. . Your physician recommends that you schedule a follow-up appointment in 12 months with Dr Sallyanne Kuster .   Any Other Special Instructions Will Be Listed Below (If Applicable).         Signed, Sanda Klein, MD  04/30/2018 8:26 AM    [facility] [place], [place], [place]  [address] Phone: [phone]; Fax: [phone]

## 2018-08-05 ENCOUNTER — Encounter: Payer: Self-pay | Admitting: Obstetrics and Gynecology

## 2018-08-05 ENCOUNTER — Other Ambulatory Visit: Payer: Self-pay

## 2018-08-05 ENCOUNTER — Other Ambulatory Visit (HOSPITAL_COMMUNITY)
Admission: RE | Admit: 2018-08-05 | Discharge: 2018-08-05 | Disposition: A | Discharge status: Home / Self Care | Payer: Medicare Other | Source: Ambulatory Visit | Attending: Obstetrics and Gynecology | Admitting: Obstetrics and Gynecology

## 2018-08-05 ENCOUNTER — Ambulatory Visit (INDEPENDENT_AMBULATORY_CARE_PROVIDER_SITE_OTHER): Payer: Medicare Other | Admitting: Obstetrics and Gynecology

## 2018-08-05 VITALS — BP 116/70 | HR 80 | Temp 97.6°F | Resp 14 | Ht 67.0 in | Wt 247.0 lb

## 2018-08-05 DIAGNOSIS — N930 Postcoital and contact bleeding: Secondary | ICD-10-CM | POA: Diagnosis not present

## 2018-08-05 DIAGNOSIS — Z01419 Encounter for gynecological examination (general) (routine) without abnormal findings: Secondary | ICD-10-CM

## 2018-08-05 NOTE — Progress Notes (Signed)
68 y.o. G27P1001 Married Caucasian female here for annual exam.    She does have spotting with intercourse.  If she uses the [employer] she does have bleeding.  No bleeding if she is not having intercourse.   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  PCP:  Burman Freestone, FNP   Patient's last menstrual period was 03/18/2001.           Sexually active: Yes.    The current method of family planning is post menopausal status.    Exercising: Yes.    strength training Smoker:  Former  Health Maintenance: Pap:  07/03/16 Normal History of abnormal Pap:   No, She did have a conization but is not sure of the details of this  MMG:  10/08/17 BIRADS 2 benign Colonoscopy:  03/02/28 Polyps removed.  Due again in 2024.  BMD:   2016  Result  Radiologist unable to perform--patient has multiple fractures and several hip/knee replacements. TDaP:  2014 Gardasil:   n/a HIV and Hep C: 06/28/15 Negative Screening Labs: PCP Shingrix:  Completed.  Prevnar 13:  Completed. Does flu vaccine yearly.    reports that she quit smoking about 32 years ago. Her smoking use included cigarettes. She has a 4.50 pack-year smoking history. She has never used smokeless tobacco. She reports that she does not drink alcohol or use drugs.  Past Medical History:  Diagnosis Date  . Allergy   . Arthritis   . Asthma   . Atrial fibrillation ([place])   . Blood transfusion without reported diagnosis    as a child--age 27  . Cancer [facility])    Mohs surgery for skin cancer on right side of nose   . Dizziness   . Dyspareunia   . Dysrhythmia    atrial fib  . Heart murmur   . Hematuria    hx of has been worked up and no problems   . Hx of adenomatous polyp of colon 03/06/2018  . Hx of seasonal allergies    tx. Allegra, Flonase  . Obesity   . Osteoporosis   . Pneumonia    hx of   . PONV (postoperative nausea and vomiting)    however no problems with past surgeries here.    Past Surgical History:  Procedure Laterality Date  .  APPENDECTOMY  1968  . CARDIAC ELECTROPHYSIOLOGY STUDY AND ABLATION  1997 & 2011   x 2  . Carpel Tunnel Bilateral 1993  . CERVICAL CONE BIOPSY     laparoscopic BSO--endometrioma  . CESAREAN SECTION  1986  . COLONOSCOPY  2009   Dr. Algis Greenhouse NL  . LEFT HEART CATH AND CORONARY ANGIOGRAPHY N/A 07/29/2016   Procedure: Left Heart Cath and Coronary Angiography;  Surgeon: Martinique, Peter M, MD;  Location: [place] [facility];  Service: Cardiovascular;  Laterality: N/A;  . OOPHORECTOMY  2009  . REVERSE SHOULDER ARTHROPLASTY Left 02/29/2016   Procedure: REVERSE SHOULDER ARTHROPLASTY;  Surgeon: Justice Britain, MD;  Location: [facility] [facility] [employer];  Service: Orthopedics;  Laterality: Left;  . REVERSE SHOULDER ARTHROPLASTY Right 06/26/2017   Procedure: RIGHT REVERSE SHOULDER ARTHROPLASTY;  Surgeon: Justice Britain, MD;  Location: [place];  Service: Orthopedics;  Laterality: Right;  . ROTATOR CUFF REPAIR Right 1999   x2 -'97, 99  . SUPERFICIAL PERONEAL NERVE RELEASE Right    Right elbow nerve release  . TOTAL HIP ARTHROPLASTY  12/27/2010   x2 replacements, x1 repair.  Marland Kitchen TOTAL HIP ARTHROPLASTY Right 03/08/2015   Procedure: TOTAL RIGHT  HIP ARTHROPLASTY ANTERIOR APPROACH;  Surgeon: Gaynelle Arabian, MD;  [facility];  Service: Orthopedics;  Laterality: Right;  . TOTAL KNEE ARTHROPLASTY Left 12/07/2012   Procedure: LEFT TOTAL KNEE ARTHROPLASTY;  Surgeon: Gearlean Alf, MD;  [facility];  Service: Orthopedics;  Laterality: Left;  . UPPER GASTROINTESTINAL ENDOSCOPY  2009   Shiflett, NL    Current Outpatient Medications  Medication Sig Dispense Refill  . acetaminophen (TYLENOL) 500 MG tablet Take 1,000 mg by mouth daily as needed for moderate pain or headache.    . albuterol (PROVENTIL) (2.5 MG/3ML) 0.083% nebulizer solution Take 2.5 mg by nebulization every 6 (six) hours as needed for wheezing or shortness of breath.    Marland Kitchen aspirin EC 81 MG tablet Take 81 mg by mouth daily.     . Calcium Carb-Cholecalciferol (CALCIUM  600+D3 PO) Take 1 tablet by mouth daily.    . fexofenadine (ALLEGRA) 180 MG tablet Take 180 mg by mouth daily.    . fluticasone (FLONASE) 50 MCG/ACT nasal spray Place 2 sprays into both nostrils daily at 6 (six) AM.     . ibuprofen (ADVIL,MOTRIN) 800 MG tablet Take 800 mg by mouth daily as needed (for pain).    Marland Kitchen lansoprazole (PREVACID) 30 MG capsule Take 30 mg by mouth daily before breakfast.    . loratadine (CLARITIN) 10 MG tablet Take 10 mg by mouth at bedtime.     . metoprolol tartrate (LOPRESSOR) 25 MG tablet Take 1 tablet (25 mg total) by mouth every morning AND 2 tablets (50 mg total) every evening. 270 tablet 3  . OVER THE COUNTER MEDICATION Vitamin B 6 100 mg one tablet daily.    Marland Kitchen OVER THE COUNTER MEDICATION Vitamin C 500 mg one tablet daily.    . Probiotic Product (PROBIOTIC PO) Take 1 capsule by mouth daily.     . propafenone (RYTHMOL) 150 MG tablet Take 1 tablet (150 mg total) by mouth every 8 (eight) hours. 270 tablet 3   Current Facility-Administered Medications  Medication Dose Route Frequency Provider Last Rate Last Dose  . 0.9 %  sodium chloride infusion  500 mL Intravenous Once Gatha Mayer, MD        Family History  Problem Relation Age of Onset  . Stroke Mother   . Ovarian cancer Mother 9       dec uterine or ovarian ca  . Ulcerative colitis Mother   . Kidney disease Mother   . Atrial fibrillation Mother   . Heart attack Father   . Lymphoma Father   . Heart attack Brother   . Diabetes Sister        AODM  . Diabetes Sister        AODM  . Irritable bowel syndrome Sister   . Cancer Sister        kidney cancer  . Diabetes Maternal Grandmother   . Diabetes Paternal Grandmother   . Lupus Sister        dec age 20 complications from Lupus  . Colon cancer Neg Hx   . Esophageal cancer Neg Hx   . Rectal cancer Neg Hx   . Stomach cancer Neg Hx     Review of Systems  Constitutional: Negative.   HENT: Negative.   Eyes: Negative.   Respiratory: Negative.    Cardiovascular: Negative.   Gastrointestinal: Negative.   Endocrine: Negative.   Genitourinary: Negative.   Musculoskeletal: Negative.   Skin: Negative.   Allergic/Immunologic: Negative.   Neurological: Negative.   Hematological: Negative.  Psychiatric/Behavioral: Negative.     Exam:   BP 116/70 (BP Location: Left Arm, Patient Position: Sitting, Cuff Size: Large)   Pulse 80   Temp 97.6 F (36.4 C) (Temporal)   Resp 14   Ht 5\' 7"  (1.702 m)   Wt 247 lb (112 kg)   LMP 03/18/2001   BMI 38.69 kg/m     General appearance: alert, cooperative and appears stated age Head: Normocephalic, without obvious abnormality, atraumatic Neck: no adenopathy, supple, symmetrical, trachea midline and thyroid normal to inspection and palpation Lungs: clear to auscultation bilaterally Breasts: normal appearance, no masses or tenderness, No nipple retraction or dimpling, No nipple discharge or bleeding, No axillary or supraclavicular adenopathy Heart: regular rate and rhythm Abdomen: soft, non-tender; no masses, no organomegaly Extremities: extremities normal, atraumatic, no cyanosis or edema Skin: Skin color, texture, turgor normal. No rashes or lesions Lymph nodes: Cervical, supraclavicular, and axillary nodes normal. No abnormal inguinal nodes palpated Neurologic: Grossly normal  Pelvic: External genitalia:  no lesions              Urethra:  normal appearing urethra with no masses, tenderness or lesions              Bartholins and Skenes: normal                 Vagina: normal appearing vagina with normal color and discharge, no lesions              Cervix: no lesions.  Atrophy noted.                Pap taken: Yes.   Bimanual Exam:  Uterus:  normal size, contour, position, consistency, mobility, non-tender              Adnexa: no mass, fullness, tenderness              Rectal exam: Yes.  .  Confirms.              Anus:  normal sphincter tone, no lesions  Chaperone was present for  exam.  Assessment:   Well woman visit with normal exam. Recurrent post coital bleeding.  Likely from atrophy.  Hx cervical cone biopsy.  Hx lap BSO - endometrioma. Vaginal atrophy.  Hx atrial fibrillation.  FH ovarian/uterine cancer in mother.  Osteoporosis?  Plan: Mammogram screening. Recommended self breast awareness. Pap and HR HPV as above. Guidelines for Calcium, Vitamin D, regular exercise program including cardiovascular and weight bearing exercise. Labs with PCP.  Return for pelvic US.  Follow up annually and prn.   After visit summary provided.

## 2018-08-05 NOTE — Patient Instructions (Signed)

## 2018-08-07 LAB — CYTOLOGY - PAP: Diagnosis: NEGATIVE

## 2018-08-11 ENCOUNTER — Other Ambulatory Visit: Payer: Self-pay

## 2018-08-13 ENCOUNTER — Telehealth: Payer: Self-pay | Admitting: Obstetrics and Gynecology

## 2018-08-13 ENCOUNTER — Encounter: Payer: Self-pay | Admitting: Obstetrics and Gynecology

## 2018-08-13 ENCOUNTER — Ambulatory Visit (INDEPENDENT_AMBULATORY_CARE_PROVIDER_SITE_OTHER): Payer: Medicare Other

## 2018-08-13 ENCOUNTER — Ambulatory Visit (INDEPENDENT_AMBULATORY_CARE_PROVIDER_SITE_OTHER): Payer: Medicare Other | Admitting: Obstetrics and Gynecology

## 2018-08-13 ENCOUNTER — Other Ambulatory Visit: Payer: Self-pay

## 2018-08-13 VITALS — BP 120/80 | HR 72 | Temp 97.8°F | Resp 12 | Ht 67.0 in | Wt 249.0 lb

## 2018-08-13 DIAGNOSIS — N930 Postcoital and contact bleeding: Secondary | ICD-10-CM

## 2018-08-13 DIAGNOSIS — N952 Postmenopausal atrophic vaginitis: Secondary | ICD-10-CM | POA: Diagnosis not present

## 2018-08-13 NOTE — Progress Notes (Signed)
Encounter reviewed by Dr. Brook Amundson C. Silva.  

## 2018-08-13 NOTE — Progress Notes (Signed)
GYNECOLOGY  VISIT   HPI: 68 y.o.   Married  Caucasian  female   G1P1001 with Patient's last menstrual period was 03/18/2001.   here for ultrasound for post coital bleeding.   She declines medicine for post coital bleeding.   Denies spontaneous vaginal bleeding.   Patient had bilateral salpingo-oophorectomy for endometriosis.   FH of uterine and ovarian cancer in her mother.   GYNECOLOGIC HISTORY: Patient's last menstrual period was 03/18/2001. Contraception:  Postmenopausal and ovaries removed Menopausal hormone therapy:  none Last mammogram:  10/08/17 BIRADS 2 benign Last pap smear:  08/05/18 Normal        OB History    Gravida  1   Para  1   Term  1   Preterm      AB      Living  1     SAB      TAB      Ectopic      Multiple      Live Births                 Patient Active Problem List   Diagnosis Date Noted  . Long term current use of antiarrhythmic drug 04/30/2018  . Severe obesity (BMI 35.0-35.9 with comorbidity) ([place]) 04/30/2018  . PAT (paroxysmal atrial tachycardia) ([place]) 04/30/2018  . Hx of adenomatous polyp of colon 03/06/2018  . 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 Abnormal stress test 07/29/2016  . Dyspnea on exertion 07/29/2016  . S/P reverse total shoulder arthroplasty, left 02/29/2016  . OA (osteoarthritis) of hip 03/08/2015  . Atrophic vaginitis 06/10/2013  . Unspecified asthma(493.90) 12/22/2012  . Postoperative anemia due to acute blood loss 12/08/2012  . OA (osteoarthritis) of knee 12/07/2012  . Paroxysmal atrial fibrillation ([place]) 09/30/2012  . Obesity (BMI 30-39.9) 09/30/2012  . Orthostatic hypotension 09/30/2012    Past Medical History:  Diagnosis Date  . Allergy   . Arthritis   . Asthma   . Atrial fibrillation ([place])   . Blood transfusion without reported diagnosis    as a child--age 36  . Cancer [facility])    Mohs surgery for skin cancer on right side of nose   . Dizziness   . Dyspareunia   . Dysrhythmia     atrial fib  . Heart murmur   . Hematuria    hx of has been worked up and no problems   . Hx of adenomatous polyp of colon 03/06/2018  . Hx of seasonal allergies    tx. Allegra, Flonase  . Obesity   . Osteoporosis   . Pneumonia    hx of   . PONV (postoperative nausea and vomiting)    however no problems with past surgeries here.    Past Surgical History:  Procedure Laterality Date  . APPENDECTOMY  1968  . CARDIAC ELECTROPHYSIOLOGY STUDY AND ABLATION  1997 & 2011   x 2  . Carpel Tunnel Bilateral 1993  . CERVICAL CONE BIOPSY     laparoscopic BSO--endometrioma  . CESAREAN SECTION  1986  . COLONOSCOPY  2009   Dr. Algis Greenhouse [place]  . LEFT HEART CATH AND CORONARY ANGIOGRAPHY N/A 07/29/2016   Procedure: Left Heart Cath and Coronary Angiography;  Surgeon: Martinique, Peter M, MD;  Location: [place];  Service: Cardiovascular;  Laterality: N/A;  . OOPHORECTOMY  2009  . REVERSE SHOULDER ARTHROPLASTY Left 02/29/2016   Procedure: REVERSE SHOULDER ARTHROPLASTY;  Surgeon: Justice Britain, MD;  Location: [place];  Service: Orthopedics;  Laterality: Left;  . REVERSE SHOULDER ARTHROPLASTY Right 06/26/2017   Procedure: RIGHT REVERSE SHOULDER ARTHROPLASTY;  Surgeon: Justice Britain, MD;  Location: [facility];  Service: Orthopedics;  Laterality: Right;  . ROTATOR CUFF REPAIR Right 1999   x2 -'97, 99  . SUPERFICIAL PERONEAL NERVE RELEASE Right    Right elbow nerve release  . TOTAL HIP ARTHROPLASTY  12/27/2010   x2 replacements, x1 repair.  Marland Kitchen TOTAL HIP ARTHROPLASTY Right 03/08/2015   Procedure: TOTAL RIGHT  HIP ARTHROPLASTY ANTERIOR APPROACH;  Surgeon: Gaynelle Arabian, MD;  Location: [facility];  Service: Orthopedics;  Laterality: Right;  . TOTAL KNEE ARTHROPLASTY Left 12/07/2012   Procedure: LEFT TOTAL KNEE ARTHROPLASTY;  Surgeon: Gearlean Alf, MD;  Location: [facility];  Service: Orthopedics;  Laterality: Left;  . UPPER GASTROINTESTINAL ENDOSCOPY  2009   Shif[place], NL    Current Outpatient Medications   Medication Sig Dispense Refill  . acetaminophen (TYLENOL) 500 MG tablet Take 1,000 mg by mouth daily as needed for moderate pain or headache.    . albuterol (PROVENTIL) (2.5 MG/3ML) 0.083% nebulizer solution Take 2.5 mg by nebulization every 6 (six) hours as needed for wheezing or shortness of breath.    Marland Kitchen aspirin EC 81 MG tablet Take 81 mg by mouth daily.     . Calcium Carb-Cholecalciferol (CALCIUM 600+D3 PO) Take 1 tablet by mouth daily.    . fexofenadine (ALLEGRA) 180 MG tablet Take 180 mg by mouth daily.    . fluticasone (FLONASE) 50 MCG/ACT nasal spray Place 2 sprays into both nostrils daily at 6 (six) AM.     . ibuprofen (ADVIL,MOTRIN) 800 MG tablet Take 800 mg by mouth daily as needed (for pain).    Marland Kitchen lansoprazole (PREVACID) 30 MG capsule Take 30 mg by mouth daily before breakfast.    . loratadine (CLARITIN) 10 MG tablet Take 10 mg by mouth at bedtime.     . metoprolol tartrate (LOPRESSOR) 25 MG tablet Take 1 tablet (25 mg total) by mouth every morning AND 2 tablets (50 mg total) every evening. 270 tablet 3  . OVER THE COUNTER MEDICATION Vitamin B 6 100 mg one tablet daily.    Marland Kitchen OVER THE COUNTER MEDICATION Vitamin C 500 mg one tablet daily.    . Probiotic Product (PROBIOTIC PO) Take 1 capsule by mouth daily.     . propafenone (RYTHMOL) 150 MG tablet Take 1 tablet (150 mg total) by mouth every 8 (eight) hours. 270 tablet 3   Current Facility-Administered Medications  Medication Dose Route Frequency Provider Last Rate Last Dose  . 0.9 %  sodium chloride infusion  500 mL Intravenous Once Gatha Mayer, MD         ALLERGIES: Lexapro [escitalopram oxalate] and Xarelto [rivaroxaban]  Family History  Problem Relation Age of Onset  . Stroke Mother   . Ovarian cancer Mother 50       dec uterine or ovarian ca  . Ulcerative colitis Mother   . Kidney disease Mother   . Atrial fibrillation Mother   . Heart attack Father   . Lymphoma Father   . Heart attack Brother   . Diabetes Sister         AODM  . Diabetes Sister        AODM  . Irritable bowel syndrome Sister   . Cancer Sister        kidney cancer  . Diabetes Maternal Grandmother   . Diabetes Paternal Grandmother   . Lupus Sister  dec age 22 complications from Lupus  . Colon cancer Neg Hx   . Esophageal cancer Neg Hx   . Rectal cancer Neg Hx   . Stomach cancer Neg Hx     Social History   Socioeconomic History  . Marital status: Married    Spouse name: Not on file  . Number of children: 1  . Years of education: Not on file  . Highest education level: Not on file  Occupational History  . Occupation: retired  Scientific laboratory technician  . Financial resource strain: Not on file  . Food insecurity:    Worry: Not on file    Inability: Not on file  . Transportation needs:    Medical: Not on file    Non-medical: Not on file  Tobacco Use  . Smoking status: Former Smoker    Packs/day: 0.50    Years: 9.00    Pack years: 4.50    Types: Cigarettes    Last attempt to quit: 03/18/1986    Years since quitting: 32.4  . Smokeless tobacco: Never Used  Substance and Sexual Activity  . Alcohol use: No    Alcohol/week: 0.0 standard drinks  . Drug use: No  . Sexual activity: Yes    Partners: Male    Birth control/protection: Post-menopausal  Lifestyle  . Physical activity:    Days per week: Not on file    Minutes per session: Not on file  . Stress: Not on file  Relationships  . Social connections:    Talks on phone: Not on file    Gets together: Not on file    Attends religious service: Not on file    Active member of club or organization: Not on file    Attends meetings of clubs or organizations: Not on file    Relationship status: Not on file  . Intimate partner violence:    Fear of current or ex partner: Not on file    Emotionally abused: Not on file    Physically abused: Not on file    Forced sexual activity: Not on file  Other Topics Concern  . Not on file  Social History Narrative  . Not on file     Review of Systems  Constitutional: Negative.   HENT: Negative.   Eyes: Negative.   Respiratory: Negative.   Cardiovascular: Negative.   Gastrointestinal: Negative.   Endocrine: Negative.   Genitourinary: Negative.   Musculoskeletal: Negative.   Skin: Negative.   Allergic/Immunologic: Negative.   Neurological: Negative.   Hematological: Negative.   Psychiatric/Behavioral: Negative.     PHYSICAL EXAMINATION:    BP 120/80 (BP Location: Left Arm, Patient Position: Sitting, Cuff Size: Large)   Pulse 72   Resp 12   Ht 5\' 7"  (1.702 m)   Wt 249 lb (112.9 kg)   LMP 03/18/2001   BMI 39.00 kg/m     General appearance: alert, cooperative and appears stated age   Pelvic US Uterus no masses.  EMS 1.49 mm.  Ovaries absent.  No adnexal masses.  No free fluid.   ASSESSMENT  Postcoital bleeding.  Most likely due to atrophy.  Normal pap.  Normal uterus on pelvic US.  FH ovarian/uterine cancer.   PLAN  Reassurance regarding ultrasound findings.  We discussed spontaneous vaginal bleeding as a potential sign of uterine cancer. Declines local vaginal estrogen treatment.  We discussed water based lubricants and cooking oils for intercourse.  FU yearly and prn.    An After Visit Summary was printed and  given to the patient.  __15____ minutes face to face time of which over 50% was spent in counseling.

## 2019-01-17 DIAGNOSIS — U071 COVID-19: Secondary | ICD-10-CM

## 2019-01-17 HISTORY — DX: COVID-19: U07.1

## 2019-02-15 MED ORDER — PROPAFENONE HCL 150 MG PO TABS: 150.0000 mg | ORAL_TABLET | Freq: Three times a day (TID) | ORAL | 3 refills | Status: DC

## 2019-02-15 MED ORDER — METOPROLOL TARTRATE 25 MG PO TABS: ORAL_TABLET | ORAL | 3 refills | Status: DC

## 2019-02-26 MED ORDER — TRIAMTERENE-HCTZ 37.5-25 MG PO CAPS: 1.0000 | ORAL_CAPSULE | Freq: Every day | ORAL | 3 refills | Status: DC

## 2019-03-01 ENCOUNTER — Other Ambulatory Visit: Payer: Self-pay | Admitting: *Deleted

## 2019-04-30 ENCOUNTER — Other Ambulatory Visit: Payer: Self-pay

## 2019-04-30 ENCOUNTER — Ambulatory Visit: Payer: Medicare PPO | Admitting: Cardiovascular Disease

## 2019-04-30 VITALS — BP 150/80 | HR 64 | Temp 96.4°F | Ht 67.0 in | Wt 249.6 lb

## 2019-04-30 DIAGNOSIS — Z6835 Body mass index (BMI) 35.0-35.9, adult: Secondary | ICD-10-CM

## 2019-04-30 DIAGNOSIS — E7800 Pure hypercholesterolemia, unspecified: Secondary | ICD-10-CM

## 2019-04-30 DIAGNOSIS — I471 Supraventricular tachycardia: Secondary | ICD-10-CM

## 2019-04-30 DIAGNOSIS — Z79899 Other long term (current) drug therapy: Secondary | ICD-10-CM | POA: Diagnosis not present

## 2019-04-30 NOTE — Patient Instructions (Signed)
Medication Instructions:  No changes *If you need a refill on your cardiac medications before your next appointment, please call your pharmacy*  Lab Work: Your provider would like for you to return in a few weeks to have the following labs drawn: Lipid and CMET. You do not need an appointment for the lab. Once in our office lobby there is a podium where you can sign in and ring the doorbell to alert [place] that you are here. The lab is open from 8:00 am to 4:30 pm; closed for lunch from 12:45pm-1:45pm.  If you have labs (blood work) drawn today and your tests are completely normal, you will receive your results only by: Marland Kitchen MyChart Message (if you have MyChart) OR . A paper copy in the mail If you have any lab test that is abnormal or we need to change your treatment, we will call you to review the results.  Testing/Procedures: None ordered  Follow-Up: At [facility], you and your health needs are our priority.  As part of our continuing mission to provide you with exceptional heart care, we have created designated Provider Care Teams.  These Care Teams include your primary Cardiologist (physician) and Advanced Practice Providers (APPs -  Physician Assistants and Nurse Practitioners) who all work together to provide you with the care you need, when you need it.  Your next appointment:   6 month(s)  The format for your next appointment:   In Person  Provider:   You may see Sanda Klein, MD or one of the following Advanced Practice Providers on your designated Care Team:    Almyra Deforest, PA-C  Fabian Sharp, PA-C or   Roby Lofts, [place]

## 2019-04-30 NOTE — Progress Notes (Addendum)
Patient ID: Donna Moon, female   DOB: 1950/07/28, 69 y.o.   MRN: [id number]    Cardiology Office Note    Date:  05/01/2019   12 months or:  Donna, Moon 27-Dec-1950, MRN [id number]  PCP:  Joyice Faster, FNP  Cardiologist:   Sanda Klein, MD   Chief Complaint  Patient presents with  . Palpitations    History of Present Illness:  Donna Moon is a 69 y.o. female with a history of paroxysmal atrial fibrillation with 2 previous successful ablation procedures and some residual palpitations presumed to be secondary to recurrent paroxysmal atrial arrhythmia. Symptoms are very well controlled on metoprolol and propafenone.  In 2018 a false positive ECG stress stress related to coronary angiography, with normal results.  Since her ablation really have not documented atrial flutter or atrial fibrillation, her symptoms seem to be related to frequent isolated PACs and very brief bursts of paroxysmal atrial tachycardia.  Both [name] and her husband had COVID-19 in mid November.  Since then she has had increasing frequency of palpitations, but generally they still last less than a minute each time.  She has not yet recovered fully from the viral infection and has a lot of fatigue, but no other cardiovascular complaints.  She was never dyspneic and did not have syncope, edema, focal neurological events, claudication, changes in weight, cough hemoptysis or wheezing.  She does not have a history of stroke/TIA and has low embolic risk profile (CHADSvasc 1-2 for age 15 and female gender only).   Past Medical History:  Diagnosis Date  . Allergy   . Arthritis   . Asthma   . Atrial fibrillation ([place])   . Blood transfusion without reported diagnosis    as a child--age 60  . Cancer [facility])    Mohs surgery for skin cancer on right side of nose   . Dizziness   . Dyspareunia   . Dysrhythmia    atrial fib  . Heart murmur   . Hematuria    hx of has been worked up and no problems   . Hx of adenomatous polyp  of colon 03/06/2018  . Hx of seasonal allergies    tx. Allegra, Flonase  . Obesity   . Osteoporosis   . Pneumonia    hx of   . PONV (postoperative nausea and vomiting)    however no problems with past surgeries here.    Past Surgical History:  Procedure Laterality Date  . APPENDECTOMY  1968  . CARDIAC ELECTROPHYSIOLOGY STUDY AND ABLATION  1997 & 2011   x 2  . Carpel Tunnel Bilateral 1993  . CERVICAL CONE BIOPSY     laparoscopic BSO--endometrioma  . CESAREAN SECTION  1986  . COLONOSCOPY  2009   Dr. Algis Greenhouse NL  . LEFT HEART CATH AND CORONARY ANGIOGRAPHY N/A 07/29/2016   Procedure: Left Heart Cath and Coronary Angiography;  Surgeon: Martinique, Peter M, MD;  Location: [place] [facility];  Service: Cardiovascular;  Laterality: N/A;  . OOPHORECTOMY  2009  . REVERSE SHOULDER ARTHROPLASTY Left 02/29/2016   Procedure: REVERSE SHOULDER ARTHROPLASTY;  Surgeon: Justice Britain, MD;  Location: [place];  Service: Orthopedics;  Laterality: Left;  . REVERSE SHOULDER ARTHROPLASTY Right 06/26/2017   Procedure: RIGHT REVERSE SHOULDER ARTHROPLASTY;  Surgeon: Justice Britain, MD;  Location: [place];  Service: Orthopedics;  Laterality: Right;  . ROTATOR CUFF REPAIR Right 1999   x2 -'97, 99  . SUPERFICIAL PERONEAL NERVE RELEASE Right    Right  elbow nerve release  . TOTAL HIP ARTHROPLASTY  12/27/2010   x2 replacements, x1 repair.  Marland Kitchen TOTAL HIP ARTHROPLASTY Right 03/08/2015   Procedure: TOTAL RIGHT  HIP ARTHROPLASTY ANTERIOR APPROACH;  Surgeon: Gaynelle Arabian, MD;  [facility];  Service: Orthopedics;  Laterality: Right;  . TOTAL KNEE ARTHROPLASTY Left 12/07/2012   Procedure: LEFT TOTAL KNEE ARTHROPLASTY;  Surgeon: Gearlean Alf, MD;  Location: [facility];  Service: Orthopedics;  Laterality: Left;  . UPPER GASTROINTESTINAL ENDOSCOPY  2009   Shiflett, NL    Outpatient Medications Prior to Visit  Medication Sig Dispense Refill  . acetaminophen (TYLENOL) 500 MG tablet Take 1,000 mg by mouth daily as needed for  moderate pain or headache.    . albuterol (PROVENTIL) (2.5 MG/3ML) 0.083% nebulizer solution Take 2.5 mg by nebulization every 6 (six) hours as needed for wheezing or shortness of breath.    Marland Kitchen amoxicillin (AMOXIL) 500 MG capsule amoxicillin 500 mg capsule    . aspirin EC 81 MG tablet Take 81 mg by mouth daily.     . Calcium Carb-Cholecalciferol (CALCIUM 600+D3 PO) Take 1 tablet by mouth daily.    . fexofenadine (ALLEGRA) 180 MG tablet Take 180 mg by mouth daily.    . fluticasone (FLONASE) 50 MCG/ACT nasal spray Place 2 sprays into both nostrils daily at 6 (six) AM.     . ibuprofen (ADVIL,MOTRIN) 800 MG tablet Take 800 mg by mouth daily as needed (for pain).    Marland Kitchen lansoprazole (PREVACID) 30 MG capsule Take 30 mg by mouth daily before breakfast.    . loratadine (CLARITIN) 10 MG tablet Take 10 mg by mouth at bedtime.     . metoprolol tartrate (LOPRESSOR) 25 MG tablet Take 1 tablet (25 mg total) by mouth every morning AND 2 tablets (50 mg total) every evening. 270 tablet 3  . OVER THE COUNTER MEDICATION Vitamin B 6 100 mg one tablet daily.    Marland Kitchen OVER THE COUNTER MEDICATION Vitamin C 500 mg one tablet daily.    Marland Kitchen oxyCODONE (OXY IR/ROXICODONE) 5 MG immediate release tablet oxycodone 5 mg tablet    . Probiotic Product (PROBIOTIC PO) Take 1 capsule by mouth daily.     . propafenone (RYTHMOL) 150 MG tablet Take 1 tablet (150 mg total) by mouth every 8 (eight) hours. 270 tablet 3   Facility-Administered Medications Prior to Visit  Medication Dose Route Frequency Provider Last Rate Last Admin  . 0.9 %  sodium chloride infusion  500 mL Intravenous Once Gatha Mayer, MD         Allergies:   Lexapro [escitalopram oxalate] and Xarelto [rivaroxaban]   Social History   Socioeconomic History  . Marital status: Married    Spouse name: Not on file  . Number of children: 1  . Years of education: Not on file  . Highest education level: Not on file  Occupational History  . Occupation: retired  Tobacco  Use  . Smoking status: Former Smoker    Packs/day: 0.50    Years: 9.00    Pack years: 4.50    Types: Cigarettes    Quit date: 03/18/1986    Years since quitting: 33.1  . Smokeless tobacco: Never Used  Substance and Sexual Activity  . Alcohol use: No    Alcohol/week: 0.0 standard drinks  . Drug use: No  . Sexual activity: Yes    Partners: Male    Birth control/protection: Post-menopausal  Other Topics Concern  . Not on file  Social  History Narrative  . Not on file   Social Determinants of Health   Financial Resource Strain:   . Difficulty of Paying Living Expenses: Not on file  Food Insecurity:   . Worried About Charity fundraiser in the Last Year: Not on file  . Ran Out of Food in the Last Year: Not on file  Transportation Needs:   . Lack of Transportation (Medical): Not on file  . Lack of Transportation (Non-Medical): Not on file  Physical Activity:   . Days of Exercise per Week: Not on file  . Minutes of Exercise per Session: Not on file  Stress:   . Feeling of Stress : Not on file  Social Connections:   . Frequency of Communication with Friends and Family: Not on file  . Frequency of Social Gatherings with Friends and Family: Not on file  . Attends Religious Services: Not on file  . Active Member of Clubs or Organizations: Not on file  . Attends Archivist Meetings: Not on file  . Marital Status: Not on file     Family History:  The patient's family history includes Atrial fibrillation in her mother; Cancer in her sister; Diabetes in her maternal grandmother, paternal grandmother, sister, and sister; Heart attack in her brother and father; Irritable bowel syndrome in her sister; Kidney disease in her mother; Lupus in her sister; Lymphoma in her father; Ovarian cancer (age of onset: 73) in her mother; Stroke in her mother; Ulcerative colitis in her mother.   ROS:   Please see the history of present illness.    ROS All other systems are reviewed and are  negative.   PHYSICAL EXAM:   VS:  BP (!) 150/80   Pulse 64   Temp (!) 96.4 F (35.8 C)   Ht 5\' 7"  (1.702 m)   Wt 249 lb 9.6 oz (113.2 kg)   LMP 03/18/2001   SpO2 96%   BMI 39.09 kg/m      General: Alert, oriented x3, no distress, severely obese Head: no evidence of trauma, PERRL, EOMI, no exophtalmos or lid lag, no myxedema, no xanthelasma; normal ears, nose and oropharynx Neck: normal jugular venous pulsations and no hepatojugular reflux; brisk carotid pulses without delay .  No carotid bruit Chest: clear to auscultation, no signs of consolidation by percussion or palpation, normal fremitus, symmetrical and full respiratory excursions Cardiovascular: normal position and quality of the apical impulse, regular rhythm, normal first and second heart sounds, 1/6 early systolic ejection murmur in the aortic focus, no diastolic murmurs, rubs or gallops Abdomen: no tenderness or distention, no masses by palpation, no abnormal pulsatility or arterial bruits, normal bowel sounds, no hepatosplenomegaly Extremities: no clubbing, cyanosis or edema; 2+ radial, ulnar and brachial pulses bilaterally; 2+ right femoral, posterior tibial and dorsalis pedis pulses; 2+ left femoral, posterior tibial and dorsalis pedis pulses; no subclavian or femoral bruits Neurological: grossly nonfocal Psych: Normal mood and affect   Wt Readings from Last 3 Encounters:  04/30/19 249 lb 9.6 oz (113.2 kg)  08/13/18 249 lb (112.9 kg)  08/05/18 247 lb (112 kg)      Studies/Labs Reviewed:   EKG:  EKG is ordered today.  It shows sinus bradycardia, minor intraventricular conduction delay with QRS 104 ms, pre-existing Sharp Q waves in leads I and aVL, no repolarization abnormalities, QTC 449 ms  Recent Labs: No results found for requested labs within last 8760 hours.    Lipid Panel    Component Value Date/Time  CHOL 201 (H) 07/25/2016 0909   TRIG 90 07/25/2016 0909   HDL 65 07/25/2016 0909   CHOLHDL 3.1  07/25/2016 0909   VLDL 18 07/25/2016 0909   LDLCALC 118 (H) 07/25/2016 0909    ASSESSMENT:    1. PAT (paroxysmal atrial tachycardia) ([place])   2. Long term current use of antiarrhythmic drug   3. Severe obesity (BMI 35.0-35.9 with comorbidity) ([place])   4. Hypercholesterolemia      PLAN:  In order of problems listed above:   1. PAT/PAF: In the past she is also had increased frequency of palpitations with other infection such as a strep throat not long ago.  I suspect that the arrhythmia will settle down as she recovers fully from the viral infection.  Her palpitations mostly appear to be isolated, no sustained arrhythmia.  No clinical evidence of recurrent sustained atrial fibrillation since her last ablation, but had subjective palpitations.  These have been well controlled on propafenone and metoprolol.  CHA2DS2-VASc 2 (age, gender).  Since we have not truly documented atrial fibrillation in many years, will not start anticoagulation at this time.  She understands that she should promptly report sustained arrhythmia so that we can diagnose and document atrial fibrillation and she should also promptly call for urgent medical attention if she has focal neurological complaints, even if they are temporary. 2. Obesity: She is aware of the direct relationship between weight and arrhythmia problems.  Encouraged her to try to lose weight. 3. Propafenone: No evidence of proarrhythmia.  With normal coronary angiography in 2018, unlikely to develop future problems with coronary insufficiency. 4. Recent COVID-19 infection: she is already scheduled to get her vaccine once she is outside the postinfection window.  5. HLP: Labs from 2018 showed mild hypercholesterolemia (total cholesterol 201, LDL 118).  We will recheck these.  If vascular disease is identified,  she will require lipid-lowering therapy.      Medication Adjustments/Labs and Tests Ordered: Current medicines are reviewed at length with the  patient today.  Concerns regarding medicines are outlined above.  Medication changes, Labs and Tests ordered today are listed in the Patient Instructions below. Patient Instructions  Medication Instructions:  No changes *If you need a refill on your cardiac medications before your next appointment, please call your pharmacy*  Lab Work: Your provider would like for you to return in a few weeks to have the following labs drawn: Lipid and CMET. You do not need an appointment for the lab. Once in our office lobby there is a podium where you can sign in and ring the doorbell to alert [place] that you are here. The lab is open from 8:00 am to 4:30 pm; closed for lunch from 12:45pm-1:45pm.  If you have labs (blood work) drawn today and your tests are completely normal, you will receive your results only by: Marland Kitchen MyChart Message (if you have MyChart) OR . A paper copy in the mail If you have any lab test that is abnormal or we need to change your treatment, we will call you to review the results.  Testing/Procedures: None ordered  Follow-Up: At [facility], you and your health needs are our priority.  As part of our continuing mission to provide you with exceptional heart care, we have created designated Provider Care Teams.  These Care Teams include your primary Cardiologist (physician) and Advanced Practice Providers (APPs -  Physician Assistants and Nurse Practitioners) who all work together to provide you with the care you need, when you  need it.  Your next appointment:   6 month(s)  The format for your next appointment:   In Person  Provider:   You may see Sanda Klein, MD or one of the following Advanced Practice Providers on your designated Care Team:    Almyra Deforest, PA-C  Fabian Sharp, [place] or   Roby Lofts, PA-C       Signed, Sanda Klein, MD  05/01/2019 1:49 PM    [facility] [place], [place], [place]  [address] Phone: [phone]; Fax: [phone]

## 2019-05-01 ENCOUNTER — Encounter: Payer: Self-pay | Admitting: Cardiovascular Disease

## 2019-05-10 LAB — COMPREHENSIVE METABOLIC PANEL
ALT: 18 IU/L (ref 0–32)
AST: 20 IU/L (ref 0–40)
Albumin/Globulin Ratio: 1.7 (ref 1.2–2.2)
Albumin: 4.2 g/dL (ref 3.8–4.8)
Alkaline Phosphatase: 57 IU/L (ref 39–117)
BUN/Creatinine Ratio: 20 (ref 12–28)
BUN: 20 mg/dL (ref 8–27)
Bilirubin Total: 0.5 mg/dL (ref 0.0–1.2)
CO2: 23 mmol/L (ref 20–29)
Calcium: 9.7 mg/dL (ref 8.7–10.3)
Chloride: 105 mmol/L (ref 96–106)
Creatinine, Ser: 1.01 mg/dL — ABNORMAL HIGH (ref 0.57–1.00)
GFR calc Af Amer: 66 mL/min/{1.73_m2} (ref >59)
GFR calc non Af Amer: 57 mL/min/{1.73_m2} — ABNORMAL LOW (ref >59)
Globulin, Total: 2.5 g/dL (ref 1.5–4.5)
Glucose: 102 mg/dL — ABNORMAL HIGH (ref 65–99)
Potassium: 4.7 mmol/L (ref 3.5–5.2)
Sodium: 141 mmol/L (ref 134–144)
Total Protein: 6.7 g/dL (ref 6.0–8.5)

## 2019-05-10 LAB — LIPID PANEL
Chol/HDL Ratio: 3.4 ratio (ref 0.0–4.4)
Cholesterol, Total: 192 mg/dL (ref 100–199)
HDL: 57 mg/dL (ref >39)
LDL Chol Calc (NIH): 112 mg/dL — ABNORMAL HIGH (ref 0–99)
Triglycerides: 132 mg/dL (ref 0–149)
VLDL Cholesterol Cal: 23 mg/dL (ref 5–40)

## 2019-08-11 ENCOUNTER — Other Ambulatory Visit: Payer: Self-pay

## 2019-08-11 ENCOUNTER — Ambulatory Visit (INDEPENDENT_AMBULATORY_CARE_PROVIDER_SITE_OTHER): Payer: Medicare PPO | Admitting: Obstetrics and Gynecology

## 2019-08-11 ENCOUNTER — Encounter: Payer: Self-pay | Admitting: Obstetrics and Gynecology

## 2019-08-11 VITALS — BP 140/82 | HR 64 | Temp 97.1°F | Resp 16 | Ht 67.0 in | Wt 252.0 lb

## 2019-08-11 DIAGNOSIS — Z01419 Encounter for gynecological examination (general) (routine) without abnormal findings: Secondary | ICD-10-CM

## 2019-08-11 NOTE — Patient Instructions (Signed)

## 2019-08-11 NOTE — Progress Notes (Signed)
69 y.o. G39P1001 Married Caucasian female here for annual exam.    She has some spotting with intercourse, and this occurs rarely.  She had a normal US of the uterus.   EMS 1.49. She uses water based products.   Now back at the [facility].   Had Covid in November, 2020.  Her husband had it also.   Patient has been vaccinated against Covid.   Lost her PCP due to an inactive patient status.   PCP:   None.   Patient's last menstrual period was 03/18/2001.           Sexually active: Yes.    The current method of family planning is post menopausal status.    Exercising: Yes.    water aerobics at [facility] Smoker:  Former  Health Maintenance: Pap: 08-05-18 Neg, 07-03-17 Neg, 07/03/16 Normal History of abnormal Pap:  She did have a conization but is not sure of the details of this MMG: 10-19-18 Neg/BiRads2.   Colonoscopy:  03/02/28 Polyps;next 2024 BMD: 2016 Result :  Radiologist unable to perform--patient has multiple fractures and several hip/knee replacements. TDaP: 2014 Gardasil:   no HIV: 06-28-15 NR Hep C: 06-28-15 Neg Screening Labs:  PCP   reports that she quit smoking about 33 years ago. Her smoking use included cigarettes. She has a 4.50 pack-year smoking history. She has never used smokeless tobacco. She reports that she does not drink alcohol or use drugs.  Past Medical History:  Diagnosis Date  . Allergy   . Arthritis   . Asthma   . Atrial fibrillation ([place])   . Blood transfusion without reported diagnosis    as a child--age 89  . Cancer [facility])    Mohs surgery for skin cancer on right side of nose   . COVID-19 01/2019  . Dizziness   . Dyspareunia   . Dysrhythmia    atrial fib  . Heart murmur   . Hematuria    hx of has been worked up and no problems   . Hx of adenomatous polyp of colon 03/06/2018  . Hx of seasonal allergies    tx. Allegra, Flonase  . Obesity   . Osteoporosis   . Pneumonia    hx of   . PONV (postoperative nausea and vomiting)    however no problems  with past surgeries here.    Past Surgical History:  Procedure Laterality Date  . APPENDECTOMY  1968  . CARDIAC ELECTROPHYSIOLOGY STUDY AND ABLATION  1997 & 2011   x 2  . Carpel Tunnel Bilateral 1993  . CERVICAL CONE BIOPSY     laparoscopic BSO--endometrioma  . CESAREAN SECTION  1986  . COLONOSCOPY  2009   Dr. Algis Greenhouse NL  . LEFT HEART CATH AND CORONARY ANGIOGRAPHY N/A 07/29/2016   Procedure: Left Heart Cath and Coronary Angiography;  Surgeon: Martinique, Peter M, MD;  Location: [place] [facility];  Service: Cardiovascular;  Laterality: N/A;  . OOPHORECTOMY  2009  . REVERSE SHOULDER ARTHROPLASTY Left 02/29/2016   Procedure: REVERSE SHOULDER ARTHROPLASTY;  Surgeon: Justice Britain, MD;  Location: [place];  Service: Orthopedics;  Laterality: Left;  . REVERSE SHOULDER ARTHROPLASTY Right 06/26/2017   Procedure: RIGHT REVERSE SHOULDER ARTHROPLASTY;  Surgeon: Justice Britain, MD;  Location: [place];  Service: Orthopedics;  Laterality: Right;  . ROTATOR CUFF REPAIR Right 1999   x2 -'97, 99  . SUPERFICIAL PERONEAL NERVE RELEASE Right    Right elbow nerve release  . TOTAL HIP ARTHROPLASTY  12/27/2010   x2 replacements,  x1 repair.  Marland Kitchen TOTAL HIP ARTHROPLASTY Right 03/08/2015   Procedure: TOTAL RIGHT  HIP ARTHROPLASTY ANTERIOR APPROACH;  Surgeon: Gaynelle Arabian, MD;  [facility];  Service: Orthopedics;  Laterality: Right;  . TOTAL KNEE ARTHROPLASTY Left 12/07/2012   Procedure: LEFT TOTAL KNEE ARTHROPLASTY;  Surgeon: Gearlean Alf, MD;  [facility];  Service: Orthopedics;  Laterality: Left;  . UPPER GASTROINTESTINAL ENDOSCOPY  2009   Shiflett, NL    Current Outpatient Medications  Medication Sig Dispense Refill  . acetaminophen (TYLENOL) 500 MG tablet Take 1,000 mg by mouth daily as needed for moderate pain or headache.    . albuterol (PROVENTIL) (2.5 MG/3ML) 0.083% nebulizer solution Take 2.5 mg by nebulization every 6 (six) hours as needed for wheezing or shortness of breath.    Marland Kitchen amoxicillin  (AMOXIL) 500 MG capsule amoxicillin 500 mg capsule    . aspirin EC 81 MG tablet Take 81 mg by mouth daily.     . Calcium Carb-Cholecalciferol (CALCIUM 600+D3 PO) Take 1 tablet by mouth daily.    . fexofenadine (ALLEGRA) 180 MG tablet Take 180 mg by mouth daily.    . fluticasone (FLONASE) 50 MCG/ACT nasal spray Place 2 sprays into both nostrils daily at 6 (six) AM.     . ibuprofen (ADVIL,MOTRIN) 800 MG tablet Take 800 mg by mouth daily as needed (for pain).    Marland Kitchen lansoprazole (PREVACID) 30 MG capsule Take 30 mg by mouth daily before breakfast.    . loratadine (CLARITIN) 10 MG tablet Take 10 mg by mouth at bedtime.     . metoprolol tartrate (LOPRESSOR) 25 MG tablet Take 1 tablet (25 mg total) by mouth every morning AND 2 tablets (50 mg total) every evening. 270 tablet 3  . OVER THE COUNTER MEDICATION Vitamin B 6 100 mg one tablet daily.    Marland Kitchen OVER THE COUNTER MEDICATION Vitamin C 500 mg one tablet daily.    Marland Kitchen oxyCODONE (OXY IR/ROXICODONE) 5 MG immediate release tablet oxycodone 5 mg tablet    . Probiotic Product (PROBIOTIC PO) Take 1 capsule by mouth daily.     . propafenone (RYTHMOL) 150 MG tablet Take 1 tablet (150 mg total) by mouth every 8 (eight) hours. 270 tablet 3   Current Facility-Administered Medications  Medication Dose Route Frequency Provider Last Rate Last Admin  . 0.9 %  sodium chloride infusion  500 mL Intravenous Once Gatha Mayer, MD        Family History  Problem Relation Age of Onset  . Stroke Mother   . Ovarian cancer Mother 25       dec uterine or ovarian ca  . Ulcerative colitis Mother   . Kidney disease Mother   . Atrial fibrillation Mother   . Heart attack Father   . Lymphoma Father   . Heart attack Brother   . Diabetes Sister        AODM  . Diabetes Sister        AODM  . Irritable bowel syndrome Sister   . Cancer Sister        kidney cancer  . Diabetes Maternal Grandmother   . Diabetes Paternal Grandmother   . Lupus Sister        dec age 41  complications from Lupus  . Cancer Brother 21       melanoma  . Colon cancer Neg Hx   . Esophageal cancer Neg Hx   . Rectal cancer Neg Hx   . Stomach cancer Neg  Hx     Review of Systems  All other systems reviewed and are negative.   Exam:   BP 140/82 (Cuff Size: Large)   Pulse 64   Temp (!) 97.1 F (36.2 C) (Temporal)   Resp 16   Ht 5\' 7"  (1.702 m)   Wt 252 lb (114.3 kg)   LMP 03/18/2001   BMI 39.47 kg/m     General appearance: alert, cooperative and appears stated age Head: normocephalic, without obvious abnormality, atraumatic Neck: no adenopathy, supple, symmetrical, trachea midline and thyroid normal to inspection and palpation Lungs: clear to auscultation bilaterally Breasts: normal appearance, no masses or tenderness, No nipple retraction or dimpling, No nipple discharge or bleeding, No axillary adenopathy Heart: regular rate and rhythm Abdomen: soft, non-tender; no masses, no organomegaly Extremities: extremities normal, atraumatic, no cyanosis or edema Skin: skin color, texture, turgor normal. No rashes or lesions Lymph nodes: cervical, supraclavicular, and axillary nodes normal. Neurologic: grossly normal  Pelvic: External genitalia:  no lesions              No abnormal inguinal nodes palpated.              Urethra:  normal appearing urethra with no masses, tenderness or lesions              Bartholins and Skenes: normal                 Vagina: atrophy noted, no lesions              Cervix: no lesions              Pap taken: No. Bimanual Exam:  Uterus:  normal size, contour, position, consistency, mobility, non-tender              Adnexa: no mass, fullness, tenderness              Rectal exam: Yes.  .  Confirms.              Anus:  normal sphincter tone, no lesions  Chaperone was present for exam.  Assessment:   Well woman visit with normal exam. Hx cervical cone biopsy.  Hx lap BSO - endometrioma. Vaginal atrophy.  Hx atrial fibrillation. FH  ovarian/uterine cancer in mother. Post coital spotting with a negative pelvic US.   Plan: Mammogram screening discussed. Self breast awareness reviewed. Pap and HR HPV as above. Guidelines for Calcium, Vitamin D, regular exercise program including cardiovascular and weight bearing exercise. Cooking oils discussed for lubrication.  She will call if she develops any persistent spotting or spontaneous vaginal bleeding.   List of PCPs to patient. Follow up annually and prn.   After visit summary provided.

## 2019-09-09 ENCOUNTER — Telehealth: Payer: Self-pay | Admitting: Internal Medicine

## 2019-09-09 NOTE — Telephone Encounter (Signed)
Spoke with patient, I told her that I was returning her call about abdominal pain, pt states " honey, its more than abdominal pain, I'm in so much pain that I break out into cold sweats this has been going on for 2 weeks". Pt states that she went to the urgent care and they did lab work but nothing else was done for the patient, I advised patient since she is in so much pain and we are unable to get her in for an appt that she will need to go to the nearest ED . Pt states that her nearest ED is in [place], [place]. Pt advised that she should be seen sooner than later so that she can be evaluated.  Pt thanked me for the help.

## 2019-10-26 ENCOUNTER — Ambulatory Visit: Payer: Medicare PPO | Admitting: Internal Medicine

## 2019-10-26 ENCOUNTER — Other Ambulatory Visit (INDEPENDENT_AMBULATORY_CARE_PROVIDER_SITE_OTHER): Payer: Medicare PPO

## 2019-10-26 ENCOUNTER — Encounter: Payer: Self-pay | Admitting: Internal Medicine

## 2019-10-26 VITALS — BP 130/77 | HR 72 | Ht 67.0 in | Wt 245.6 lb

## 2019-10-26 DIAGNOSIS — R634 Abnormal weight loss: Secondary | ICD-10-CM | POA: Diagnosis not present

## 2019-10-26 DIAGNOSIS — R10811 Right upper quadrant abdominal tenderness: Secondary | ICD-10-CM

## 2019-10-26 DIAGNOSIS — R194 Change in bowel habit: Secondary | ICD-10-CM

## 2019-10-26 DIAGNOSIS — R10816 Epigastric abdominal tenderness: Secondary | ICD-10-CM

## 2019-10-26 LAB — CBC WITH DIFFERENTIAL/PLATELET
Basophils Absolute: 0.1 10*3/uL (ref 0.0–0.1)
Basophils Relative: 1 % (ref 0.0–3.0)
Eosinophils Absolute: 0.1 10*3/uL (ref 0.0–0.7)
Eosinophils Relative: 1.4 % (ref 0.0–5.0)
HCT: 41.2 % (ref 36.0–46.0)
Hemoglobin: 13.8 g/dL (ref 12.0–15.0)
Lymphocytes Relative: 26.3 % (ref 12.0–46.0)
Lymphs Abs: 1.9 10*3/uL (ref 0.7–4.0)
MCHC: 33.5 g/dL (ref 30.0–36.0)
MCV: 94.3 fl (ref 78.0–100.0)
Monocytes Absolute: 0.7 10*3/uL (ref 0.1–1.0)
Monocytes Relative: 10 % (ref 3.0–12.0)
Neutro Abs: 4.4 10*3/uL (ref 1.4–7.7)
Neutrophils Relative %: 61.3 % (ref 43.0–77.0)
Platelets: 243 10*3/uL (ref 150.0–400.0)
RBC: 4.37 Mil/uL (ref 3.87–5.11)
RDW: 13.9 % (ref 11.5–15.5)
WBC: 7.2 10*3/uL (ref 4.0–10.5)

## 2019-10-26 LAB — COMPREHENSIVE METABOLIC PANEL
ALT: 21 U/L (ref 0–35)
AST: 20 U/L (ref 0–37)
Albumin: 4.5 g/dL (ref 3.5–5.2)
Alkaline Phosphatase: 51 U/L (ref 39–117)
BUN: 19 mg/dL (ref 6–23)
CO2: 27 mEq/L (ref 19–32)
Calcium: 9.8 mg/dL (ref 8.4–10.5)
Chloride: 104 mEq/L (ref 96–112)
Creatinine, Ser: 1.25 mg/dL — ABNORMAL HIGH (ref 0.40–1.20)
GFR: 42.48 mL/min — ABNORMAL LOW (ref >60.00)
Glucose, Bld: 117 mg/dL — ABNORMAL HIGH (ref 70–99)
Potassium: 4.2 mEq/L (ref 3.5–5.1)
Sodium: 139 mEq/L (ref 135–145)
Total Bilirubin: 0.4 mg/dL (ref 0.2–1.2)
Total Protein: 7.5 g/dL (ref 6.0–8.3)

## 2019-10-26 LAB — LIPASE: Lipase: 38 U/L (ref 11.0–59.0)

## 2019-10-26 LAB — AMYLASE: Amylase: 20 U/L — ABNORMAL LOW (ref 27–131)

## 2019-10-26 NOTE — Patient Instructions (Signed)
Your provider has requested that you go to the basement level for lab work before leaving today. Press "B" on the elevator. The lab is located at the first door on the left as you exit the elevator.  Due to recent changes in healthcare laws, you may see the results of your imaging and laboratory studies on MyChart before your provider has had a chance to review them.  We understand that in some cases there may be results that are confusing or concerning to you. Not all laboratory results come back in the same time frame and the provider may be waiting for multiple results in order to interpret others.  Please give [place] 48 hours in order for your provider to thoroughly review all the results before contacting the office for clarification of your results.   Normal BMI (Body Mass Index- based on height and weight) is between 23 and 30. Your BMI today is Body mass index is 38.47 kg/m. Marland Kitchen Please consider follow up  regarding your BMI with your Primary Care Provider.  You have been scheduled for a CT scan of the abdomen and pelvis at [place] are scheduled on 11/04/2019 at 2:00pm. You should arrive 15 minutes prior to your appointment time for registration. Please follow the written instructions below on the day of your exam:  WARNING: IF YOU ARE ALLERGIC TO IODINE/X-RAY DYE, PLEASE NOTIFY RADIOLOGY IMMEDIATELY AT [phone]! YOU WILL BE GIVEN A 13 HOUR PREMEDICATION PREP.  1) Do not eat or drink anything after 10:00AM (4 hours prior to your test) 2) You have been given 2 bottles of oral contrast to drink. The solution may taste better if refrigerated, but do NOT add ice or any other liquid to this solution. Shake well before drinking.    Drink 1 bottle of contrast @ 12:00pm (2 hours prior to your exam)  Drink 1 bottle of contrast @ 1:00pm (1 hour prior to your exam)  You may take any medications as prescribed with a small amount of water, if necessary. If you take any of the following  medications: METFORMIN, GLUCOPHAGE, GLUCOVANCE, AVANDAMET, RIOMET, FORTAMET, [place] MET, JANUMET, GLUMETZA or METAGLIP, you MAY be asked to HOLD this medication 48 hours AFTER the exam.  The purpose of you drinking the oral contrast is to aid in the visualization of your intestinal tract. The contrast solution may cause some diarrhea. Depending on your individual set of symptoms, you may also receive an intravenous injection of x-ray contrast/dye. Plan on being at [facility] for 30 minutes or longer, depending on the type of exam you are having performed.  This test typically takes 30-45 minutes to complete.  If you have any questions regarding your exam or if you need to reschedule, you may call the CT department at [phone]  between the hours of 8:00 am and 5:00 pm, Monday-Friday.  ________________________________________________________________________  I appreciate the opportunity to care for you. Silvano Rusk, MD, [facility]

## 2019-10-26 NOTE — Progress Notes (Signed)
Donna Moon 69 y.o. 09-09-1950 [id number]  Assessment & Plan:   Encounter Diagnoses  Name Primary?  . Epigastric abdominal tenderness without rebound tenderness Yes  . Right upper quadrant abdominal tenderness without rebound tenderness   . Loss of weight   . Change in bowel habits   Evaluate as below.  1 month of PPI did not help.  Question biliary pancreatic issue.  If cross-sectional imaging negative consider EGD.  Orders Placed This Encounter  Procedures  . CT Abdomen Pelvis W Contrast  . CBC with Differential/Platelet  . Comprehensive metabolic panel  . Amylase  . Lipase      Subjective:   Chief Complaint: epigastric pain  HPI Patient is here with complaints of epigastric pain and right upper quadrant pain that started several hours after eating fast food at a restaurant in mid June.  Been relatively constant since then waxing and waning.  Went to an urgent care after she called [place] about this and I suggested she have a more immediate evaluation.  She said she had labs done that were unremarkable and they put her on PPI pantoprazole for 30 days and it really did not make any difference.  She has been noting a little bit of alteration in bowel habits with some constipation and then loose stools off and on.  Last colonoscopy December 2019. She had diverticulosis and a diminutive adenoma.  She has not had problems like this before.  She has lost about 6 pounds unintentionally.  Sometimes she feels full when she eats sooner than before but not on a regular basis. Allergies  Allergen Reactions  . Lexapro [Escitalopram Oxalate] Other (See Comments)    Flu like symptoms   . Xarelto [Rivaroxaban] Itching   Current Meds  Medication Sig  . acetaminophen (TYLENOL) 500 MG tablet Take 1,000 mg by mouth daily as needed for moderate pain or headache.  . albuterol (PROVENTIL) (2.5 MG/3ML) 0.083% nebulizer solution Take 2.5 mg by nebulization every 6 (six) hours as needed for wheezing  or shortness of breath.  Marland Kitchen amoxicillin (AMOXIL) 500 MG capsule amoxicillin 500 mg capsule  . aspirin EC 81 MG tablet Take 81 mg by mouth daily.   . Calcium Carb-Cholecalciferol (CALCIUM 600+D3 PO) Take 1 tablet by mouth daily.  Marland Kitchen ibuprofen (ADVIL,MOTRIN) 800 MG tablet Take 800 mg by mouth daily as needed (for pain).  . metoprolol tartrate (LOPRESSOR) 25 MG tablet Take 1 tablet (25 mg total) by mouth every morning AND 2 tablets (50 mg total) every evening.  Marland Kitchen OVER THE COUNTER MEDICATION Vitamin B 6 100 mg one tablet daily.  Marland Kitchen OVER THE COUNTER MEDICATION Vitamin C 500 mg one tablet daily.  Marland Kitchen oxyCODONE (OXY IR/ROXICODONE) 5 MG immediate release tablet oxycodone 5 mg tablet  . Probiotic Product (PROBIOTIC PO) Take 1 tablet by mouth daily.  . propafenone (RYTHMOL) 150 MG tablet Take 1 tablet (150 mg total) by mouth every 8 (eight) hours.   Past Medical History:  Diagnosis Date  . Allergy   . Arthritis   . Asthma   . Atrial fibrillation ([place])   . Blood transfusion without reported diagnosis    as a child--age 52  . Cancer [facility])    Mohs surgery for skin cancer on right side of nose   . COVID-19 01/2019  . Dizziness   . Dyspareunia   . Dysrhythmia    atrial fib  . Heart murmur   . Hematuria    hx of has been worked up  and no problems   . Hx of adenomatous polyp of colon 03/06/2018  . Hx of seasonal allergies    tx. Allegra, Flonase  . Obesity   . Osteoporosis   . Pneumonia    hx of   . PONV (postoperative nausea and vomiting)    however no problems with past surgeries here.   Past Surgical History:  Procedure Laterality Date  . APPENDECTOMY  1968  . CARDIAC ELECTROPHYSIOLOGY STUDY AND ABLATION  1997 & 2011   x 2  . Carpel Tunnel Bilateral 1993  . CERVICAL CONE BIOPSY     laparoscopic BSO--endometrioma  . CESAREAN SECTION  1986  . COLONOSCOPY  2009   Dr. Algis Greenhouse NL  . LEFT HEART CATH AND CORONARY ANGIOGRAPHY N/A 07/29/2016   Procedure: Left Heart Cath and Coronary  Angiography;  Surgeon: Martinique, Peter M, MD;  Location: [place] CV LAB;  Service: Cardiovascular;  Laterality: N/A;  . OOPHORECTOMY  2009  . REVERSE SHOULDER ARTHROPLASTY Left 02/29/2016   Procedure: REVERSE SHOULDER ARTHROPLASTY;  Surgeon: Justice Britain, MD;  Location: [place];  Service: Orthopedics;  Laterality: Left;  . REVERSE SHOULDER ARTHROPLASTY Right 06/26/2017   Procedure: RIGHT REVERSE SHOULDER ARTHROPLASTY;  Surgeon: Justice Britain, MD;  Location: [place];  Service: Orthopedics;  Laterality: Right;  . ROTATOR CUFF REPAIR Right 1999   x2 -'97, 99  . SUPERFICIAL PERONEAL NERVE RELEASE Right    Right elbow nerve release  . TOTAL HIP ARTHROPLASTY  12/27/2010   x2 replacements, x1 repair.  Marland Kitchen TOTAL HIP ARTHROPLASTY Right 03/08/2015   Procedure: TOTAL RIGHT  HIP ARTHROPLASTY ANTERIOR APPROACH;  Surgeon: Gaynelle Arabian, MD;  [facility];  Service: Orthopedics;  Laterality: Right;  . TOTAL KNEE ARTHROPLASTY Left 12/07/2012   Procedure: LEFT TOTAL KNEE ARTHROPLASTY;  Surgeon: Gearlean Alf, MD;  [facility]: [facility];  Service: Orthopedics;  Laterality: Left;  . UPPER GASTROINTESTINAL ENDOSCOPY  2009   Shiflett, NL   Social History   Social History Narrative  . Not on file   family history includes Atrial fibrillation in her mother; Cancer in her sister; Cancer (age of onset: 51) in her brother; Diabetes in her maternal grandmother, paternal grandmother, sister, and sister; Heart attack in her brother and father; Irritable bowel syndrome in her sister; Kidney disease in her mother; Lupus in her sister; Lymphoma in her father; Ovarian cancer (age of onset: 73) in her mother; Stroke in her mother; Ulcerative colitis in her mother.   Review of Systems   Objective:   Physical Exam BP 130/77   Pulse 72   Ht 5\' 7"  (1.702 m)   Wt 245 lb 9.6 oz (111.4 kg)   LMP 03/18/2001   BMI 38.47 kg/m  Obese NAD Lungs cta Cor NL abd obese and tender epigastric and RUQ no HSM, BS + neg Carnett's No  rib tenderness, xiphoid Ext no c/c/e

## 2019-10-27 ENCOUNTER — Ambulatory Visit: Payer: Medicare PPO | Admitting: Cardiovascular Disease

## 2019-10-27 ENCOUNTER — Encounter: Payer: Self-pay | Admitting: Cardiovascular Disease

## 2019-10-27 ENCOUNTER — Other Ambulatory Visit: Payer: Self-pay

## 2019-10-27 VITALS — BP 143/80 | HR 55 | Ht 68.0 in | Wt 246.2 lb

## 2019-10-27 DIAGNOSIS — Z79899 Other long term (current) drug therapy: Secondary | ICD-10-CM | POA: Diagnosis not present

## 2019-10-27 DIAGNOSIS — E7800 Pure hypercholesterolemia, unspecified: Secondary | ICD-10-CM | POA: Diagnosis not present

## 2019-10-27 DIAGNOSIS — Z6835 Body mass index (BMI) 35.0-35.9, adult: Secondary | ICD-10-CM

## 2019-10-27 DIAGNOSIS — I471 Supraventricular tachycardia: Secondary | ICD-10-CM | POA: Diagnosis not present

## 2019-10-27 MED ORDER — PROPAFENONE HCL 150 MG PO TABS: 150.0000 mg | ORAL_TABLET | Freq: Three times a day (TID) | ORAL | 3 refills | Status: DC

## 2019-10-27 MED ORDER — METOPROLOL TARTRATE 25 MG PO TABS: ORAL_TABLET | ORAL | 3 refills | Status: DC

## 2019-10-27 NOTE — Patient Instructions (Signed)

## 2019-10-27 NOTE — Progress Notes (Addendum)
Patient ID: Donna Moon, female   DOB: 09-05-1950, 69 y.o.   MRN: [id number]    Cardiology Office Note    Date:  10/27/2019   12 months or:  Donna Moon, Donna Moon February 28, 1951, MRN [id number]  PCP:  Joyice Faster, FNP  Cardiologist:   Sanda Klein, MD   Chief Complaint  Patient presents with  . Palpitations    History of Present Illness:  Donna Moon is a 69 y.o. female with a history of paroxysmal atrial fibrillation with 2 previous successful ablation procedures and some residual palpitations presumed to be secondary to recurrent paroxysmal atrial arrhythmia. Symptoms are very well controlled on metoprolol and propafenone.  In 2018 a false positive ECG stress stress led to coronary angiography, with normal results.  Since her ablation really have not documented atrial flutter or atrial fibrillation, her symptoms seem to be related to frequent isolated PACs and very brief bursts of paroxysmal atrial tachycardia.  Her palpitations "comes and goes" but are rarely severely symptomatic.  If they are bothersome, she will sit in a chair for a few minutes and they will.  The patient specifically denies any chest pain at rest exertion, dyspnea at rest or with exertion, orthopnea, paroxysmal nocturnal dyspnea, syncope, focal neurological deficits, intermittent claudication, lower extremity edema, unexplained weight gain, cough, hemoptysis or wheezing.  She stays quite active, volunteering at the [employer] and for [employer] and goes to water aerobics several times a week.  A visiting home health nurse from the insurance company told her that she has "PAD" based on what sounds like toe pulse oximetry.  He has no symptoms of PAD.  She does not have a history of stroke/TIA and has low embolic risk profile (CHADSvasc 1-2 for age 20 and female gender only).   Past Medical History:  Diagnosis Date  . Allergy   . Arthritis   . Asthma   . Atrial fibrillation ([place])   . Blood transfusion without  reported diagnosis    as a child--age 57  . Cancer [facility])    Mohs surgery for skin cancer on right side of nose   . COVID-19 01/2019  . Dizziness   . Dyspareunia   . Dysrhythmia    atrial fib  . Heart murmur   . Hematuria    hx of has been worked up and no problems   . Hx of adenomatous polyp of colon 03/06/2018  . Hx of seasonal allergies    tx. Allegra, Flonase  . Obesity   . Osteoporosis   . Pneumonia    hx of   . PONV (postoperative nausea and vomiting)    however no problems with past surgeries here.    Past Surgical History:  Procedure Laterality Date  . APPENDECTOMY  1968  . CARDIAC ELECTROPHYSIOLOGY STUDY AND ABLATION  1997 & 2011   x 2  . Carpel Tunnel Bilateral 1993  . CERVICAL CONE BIOPSY     laparoscopic BSO--endometrioma  . CESAREAN SECTION  1986  . COLONOSCOPY  2009   Dr. Algis Greenhouse NL  . LEFT HEART CATH AND CORONARY ANGIOGRAPHY N/A 07/29/2016   Procedure: Left Heart Cath and Coronary Angiography;  Surgeon: Martinique, Peter M, MD;  Location: [place] CV LAB;  Service: Cardiovascular;  Laterality: N/A;  . OOPHORECTOMY  2009  . REVERSE SHOULDER ARTHROPLASTY Left 02/29/2016   Procedure: REVERSE SHOULDER ARTHROPLASTY;  Surgeon: Justice Britain, MD;  Location: [place];  Service: Orthopedics;  Laterality: Left;  . REVERSE  SHOULDER ARTHROPLASTY Right 06/26/2017   Procedure: RIGHT REVERSE SHOULDER ARTHROPLASTY;  Surgeon: Justice Britain, MD;  Location: [place];  Service: Orthopedics;  Laterality: Right;  . ROTATOR CUFF REPAIR Right 1999   x2 -'97, 99  . SUPERFICIAL PERONEAL NERVE RELEASE Right    Right elbow nerve release  . TOTAL HIP ARTHROPLASTY  12/27/2010   x2 replacements, x1 repair.  Marland Kitchen TOTAL HIP ARTHROPLASTY Right 03/08/2015   Procedure: TOTAL RIGHT  HIP ARTHROPLASTY ANTERIOR APPROACH;  Surgeon: Gaynelle Arabian, MD;  [facility];  Service: Orthopedics;  Laterality: Right;  . TOTAL KNEE ARTHROPLASTY Left 12/07/2012   Procedure: LEFT TOTAL KNEE ARTHROPLASTY;  Surgeon:  Gearlean Alf, MD;  [facility];  Service: Orthopedics;  Laterality: Left;  . UPPER GASTROINTESTINAL ENDOSCOPY  2009   Shiflett, NL    Outpatient Medications Prior to Visit  Medication Sig Dispense Refill  . acetaminophen (TYLENOL) 500 MG tablet Take 1,000 mg by mouth daily as needed for moderate pain or headache.    . albuterol (PROVENTIL) (2.5 MG/3ML) 0.083% nebulizer solution Take 2.5 mg by nebulization every 6 (six) hours as needed for wheezing or shortness of breath.    Marland Kitchen amoxicillin (AMOXIL) 500 MG capsule amoxicillin 500 mg capsule    . aspirin EC 81 MG tablet Take 81 mg by mouth daily.     . Calcium Carb-Cholecalciferol (CALCIUM 600+D3 PO) Take 1 tablet by mouth daily.    Marland Kitchen ibuprofen (ADVIL,MOTRIN) 800 MG tablet Take 800 mg by mouth daily as needed (for pain).    Marland Kitchen OVER THE COUNTER MEDICATION Vitamin B 6 100 mg one tablet daily.    Marland Kitchen OVER THE COUNTER MEDICATION Vitamin C 500 mg one tablet daily.    . Probiotic Product (PROBIOTIC PO) Take 1 tablet by mouth daily.    . metoprolol tartrate (LOPRESSOR) 25 MG tablet Take 1 tablet (25 mg total) by mouth every morning AND 2 tablets (50 mg total) every evening. 270 tablet 3  . oxyCODONE (OXY IR/ROXICODONE) 5 MG immediate release tablet oxycodone 5 mg tablet    . propafenone (RYTHMOL) 150 MG tablet Take 1 tablet (150 mg total) by mouth every 8 (eight) hours. 270 tablet 3   No facility-administered medications prior to visit.     Allergies:   Lexapro [escitalopram oxalate] and Xarelto [rivaroxaban]   Social History   Socioeconomic History  . Marital status: Married    Spouse name: Not on file  . Number of children: 1  . Years of education: Not on file  . Highest education level: Not on file  Occupational History  . Occupation: retired  Tobacco Use  . Smoking status: Former Smoker    Packs/day: 0.50    Years: 9.00    Pack years: 4.50    Types: Cigarettes    Quit date: 03/18/1986    Years since quitting: 33.6  . Smokeless  tobacco: Never Used  Vaping Use  . Vaping Use: Never used  Substance and Sexual Activity  . Alcohol use: No    Alcohol/week: 0.0 standard drinks  . Drug use: No  . Sexual activity: Yes    Partners: Male    Birth control/protection: Post-menopausal  Other Topics Concern  . Not on file  Social History Narrative  . Not on file   Social Determinants of Health   Financial Resource Strain:   . Difficulty of Paying Living Expenses:   Food Insecurity:   . Worried About Charity fundraiser in the Last Year:   .  Ran Out of Food in the Last Year:   Transportation Needs:   . Film/video editor (Medical):   Marland Kitchen Lack of Transportation (Non-Medical):   Physical Activity:   . Days of Exercise per Week:   . Minutes of Exercise per Session:   Stress:   . Feeling of Stress :   Social Connections:   . Frequency of Communication with Friends and Family:   . Frequency of Social Gatherings with Friends and Family:   . Attends Religious Services:   . Active Member of Clubs or Organizations:   . Attends Archivist Meetings:   Marland Kitchen Marital Status:      Family History:  The patient's family history includes Atrial fibrillation in her mother; Cancer in her sister; Cancer (age of onset: 44) in her brother; Diabetes in her maternal grandmother, paternal grandmother, sister, and sister; Heart attack in her brother and father; Irritable bowel syndrome in her sister; Kidney disease in her mother; Lupus in her sister; Lymphoma in her father; Ovarian cancer (age of onset: 31) in her mother; Stroke in her mother; Ulcerative colitis in her mother.   ROS:   Please see the history of present illness.    ROS All other systems are reviewed and are negative.   PHYSICAL EXAM:   VS:  BP (!) 143/80   Pulse (!) 55   Ht 5\' 8"  (1.727 m)   Wt 246 lb 3.2 oz (111.7 kg)   LMP 03/18/2001   SpO2 98%   BMI 37.43 kg/m      General: Alert, oriented x3, no distress, severely obese Head: no evidence of  trauma, PERRL, EOMI, no exophtalmos or lid lag, no myxedema, no xanthelasma; normal ears, nose and oropharynx Neck: normal jugular venous pulsations and no hepatojugular reflux; brisk carotid pulses without delay and no carotid bruits Chest: clear to auscultation, no signs of consolidation by percussion or palpation, normal fremitus, symmetrical and full respiratory excursions Cardiovascular: normal position and quality of the apical impulse, regular rhythm, normal first and second heart sounds, 1/6 early peaking systolic ejection murmur in the aortic focus, no diastolic murmurs, rubs or gallops Abdomen: no tenderness or distention, no masses by palpation, no abnormal pulsatility or arterial bruits, normal bowel sounds, no hepatosplenomegaly Extremities: no clubbing, cyanosis or edema; 2+ radial, ulnar and brachial pulses bilaterally; 2+ right femoral, posterior tibial and dorsalis pedis pulses; 2+ left femoral, posterior tibial and dorsalis pedis pulses; no subclavian or femoral bruits Neurological: grossly nonfocal Psych: Normal mood and affect  Wt Readings from Last 3 Encounters:  10/27/19 246 lb 3.2 oz (111.7 kg)  10/26/19 245 lb 9.6 oz (111.4 kg)  08/11/19 252 lb (114.3 kg)    Studies/Labs Reviewed:   EKG:  EKG is ordered today.  It shows sinus bradycardia with mild first-degree AV block PR 210 ms, otherwise normal tracing, QTC 454 ms.  As before she has sharp initial Q waves in leads I and aVL.  Recent Labs: 10/26/2019: ALT 21; BUN 19; Creatinine, Ser 1.25; Hemoglobin 13.8; Platelets 243.0; Potassium 4.2; Sodium 139    Lipid Panel    Component Value Date/Time   CHOL 192 05/10/2019 0912   TRIG 132 05/10/2019 0912   HDL 57 05/10/2019 0912   CHOLHDL 3.4 05/10/2019 0912   CHOLHDL 3.1 07/25/2016 0909   VLDL 18 07/25/2016 0909   LDLCALC 112 (H) 05/10/2019 0912    ASSESSMENT:    1. PAT (paroxysmal atrial tachycardia) ([place])   2. Severe obesity (BMI 35.0-35.9 with  comorbidity) ([place])    3. Long term current use of antiarrhythmic drug   4. Hypercholesterolemia      PLAN:  In order of problems listed above:   1. PAT/PAF: Symptoms are generally well controlled on propafenone and metoprolol.  Okay to take an additional metoprolol 25 mg as needed on "bad days".  Otherwise bradycardia limits more aggressive pharmacological treatment.  CHA2DS2-VASc 2 (age, gender).  Not on anticoagulants.  She understands that she should promptly report sustained arrhythmia so that we can diagnose and document atrial fibrillation and she should also promptly call for urgent medical attention if she has focal neurological complaints, even if they are temporary. 2. Obesity: Reviewed the fact that weight loss would be to reduce frequency of palpitations. 3. Propafenone: No evidence of proarrhythmia.  With normal coronary angiography in 2018, unlikely to develop future problems with coronary insufficiency. 4. HLP: Mild elevation in LDL cholesterol, but in the absence of established coronary or peripheral vascular disease aggressive treatment is not indicated.  Recommend diet and weight loss.  Offered ABI to evaluate for possible PAD, although I find no signs of misdiagnosis on physical exam.  She declined.      Medication Adjustments/Labs and Tests Ordered: Current medicines are reviewed at length with the patient today.  Concerns regarding medicines are outlined above.  Medication changes, Labs and Tests ordered today are listed in the Patient Instructions below. Patient Instructions  Medication Instructions:  No changes *If you need a refill on your cardiac medications before your next appointment, please call your pharmacy*   Lab Work: None ordered If you have labs (blood work) drawn today and your tests are completely normal, you will receive your results only by: Marland Kitchen MyChart Message (if you have MyChart) OR . A paper copy in the mail If you have any lab test that is abnormal or we need to  change your treatment, we will call you to review the results.   Testing/Procedures: None ordered   Follow-Up: At [facility], you and your health needs are our priority.  As part of our continuing mission to provide you with exceptional heart care, we have created designated Provider Care Teams.  These Care Teams include your primary Cardiologist (physician) and Advanced Practice Providers (APPs -  Physician Assistants and Nurse Practitioners) who all work together to provide you with the care you need, when you need it.  We recommend signing up for the patient portal called "MyChart".  Sign up information is provided on this After Visit Summary.  MyChart is used to connect with patients for Virtual Visits (Telemedicine).  Patients are able to view lab/test results, encounter notes, upcoming appointments, etc.  Non-urgent messages can be sent to your provider as well.   To learn more about what you can do with MyChart, go to [email or web address].    Your next appointment:   12 month(s)  The format for your next appointment:   In Person  Provider:   You may see Sanda Klein, MD or one of the following Advanced Practice Providers on your designated Care Team:    Almyra Deforest, PA-C  Fabian Sharp, [place] or   Roby Lofts, PA-C        Signed, Sanda Klein, MD  10/27/2019 8:48 AM    [address], [place], [place]  [address] Phone: [phone]; Fax: [phone]

## 2019-11-04 ENCOUNTER — Ambulatory Visit
Admission: RE | Admit: 2019-11-04 | Discharge: 2019-11-04 | Disposition: A | Discharge status: Home / Self Care | Payer: Medicare PPO | Source: Ambulatory Visit | Attending: Internal Medicine | Admitting: Internal Medicine

## 2019-11-04 ENCOUNTER — Other Ambulatory Visit: Payer: Self-pay

## 2019-11-04 DIAGNOSIS — R10811 Right upper quadrant abdominal tenderness: Secondary | ICD-10-CM

## 2019-11-04 DIAGNOSIS — R10816 Epigastric abdominal tenderness: Secondary | ICD-10-CM

## 2019-11-04 DIAGNOSIS — R634 Abnormal weight loss: Secondary | ICD-10-CM

## 2019-11-04 MED ORDER — IOPAMIDOL (ISOVUE-300) INJECTION 61%
100.0000 mL | Freq: Once | INTRAVENOUS | Status: AC | PRN
  Administered 2019-11-04: 80 mL via INTRAVENOUS

## 2019-11-29 ENCOUNTER — Ambulatory Visit (AMBULATORY_SURGERY_CENTER): Payer: Self-pay | Admitting: *Deleted

## 2019-11-29 VITALS — Ht 68.0 in | Wt 247.0 lb

## 2019-11-29 DIAGNOSIS — R10816 Epigastric abdominal tenderness: Secondary | ICD-10-CM

## 2019-11-29 NOTE — Progress Notes (Signed)

## 2019-12-02 ENCOUNTER — Encounter: Payer: Medicare PPO | Admitting: Gastroenterology

## 2019-12-06 ENCOUNTER — Ambulatory Visit (AMBULATORY_SURGERY_CENTER): Payer: Medicare PPO | Admitting: Internal Medicine

## 2019-12-06 ENCOUNTER — Other Ambulatory Visit: Payer: Self-pay

## 2019-12-06 ENCOUNTER — Encounter: Payer: Self-pay | Admitting: Internal Medicine

## 2019-12-06 VITALS — BP 107/86 | HR 53 | Temp 96.9°F | Resp 13 | Ht 68.0 in | Wt 247.0 lb

## 2019-12-06 DIAGNOSIS — K3189 Other diseases of stomach and duodenum: Secondary | ICD-10-CM | POA: Diagnosis not present

## 2019-12-06 DIAGNOSIS — R10816 Epigastric abdominal tenderness: Secondary | ICD-10-CM

## 2019-12-06 DIAGNOSIS — K2970 Gastritis, unspecified, without bleeding: Secondary | ICD-10-CM | POA: Diagnosis not present

## 2019-12-06 DIAGNOSIS — K2980 Duodenitis without bleeding: Secondary | ICD-10-CM

## 2019-12-06 DIAGNOSIS — K259 Gastric ulcer, unspecified as acute or chronic, without hemorrhage or perforation: Secondary | ICD-10-CM

## 2019-12-06 MED ORDER — OMEPRAZOLE 40 MG PO CPDR: 40.0000 mg | DELAYED_RELEASE_CAPSULE | Freq: Every day | ORAL | 2 refills | Status: DC

## 2019-12-06 MED ORDER — SODIUM CHLORIDE 0.9 % IV SOLN: 500.0000 mL | INTRAVENOUS | Status: DC

## 2019-12-06 NOTE — Progress Notes (Signed)
Called to room to assist during endoscopic procedure.  Patient ID and intended procedure confirmed with present staff. Received instructions for my participation in the procedure from the performing physician.  

## 2019-12-06 NOTE — Progress Notes (Signed)
Report to PACU, RN, vss, BBS= Clear.  

## 2019-12-06 NOTE — Progress Notes (Signed)
Vs AG Pt's states no medical or surgical changes since previsit or office visit.

## 2019-12-06 NOTE — Patient Instructions (Signed)
Handout on gastritis given to you today  Start omeprazole 40 mg once a day    YOU HAD AN ENDOSCOPIC PROCEDURE TODAY AT [facility]:   Refer to the procedure report that was given to you for any specific questions about what was found during the examination.  If the procedure report does not answer your questions, please call your gastroenterologist to clarify.  If you requested that your care partner not be given the details of your procedure findings, then the procedure report has been included in a sealed envelope for you to review at your convenience later.  YOU SHOULD EXPECT: Some feelings of bloating in the abdomen. Passage of more gas than usual.  Walking can help get rid of the air that was put into your GI tract during the procedure and reduce the bloating. If you had a lower endoscopy (such as a colonoscopy or flexible sigmoidoscopy) you may notice spotting of blood in your stool or on the toilet paper. If you underwent a bowel prep for your procedure, you may not have a normal bowel movement for a few days.  Please Note:  You might notice some irritation and congestion in your nose or some drainage.  This is from the oxygen used during your procedure.  There is no need for concern and it should clear up in a day or so.  SYMPTOMS TO REPORT IMMEDIATELY:   Following upper endoscopy (EGD)  Vomiting of blood or coffee ground material  New chest pain or pain under the shoulder blades  Painful or persistently difficult swallowing  New shortness of breath  Fever of 100F or higher  Black, tarry-looking stools  For urgent or emergent issues, a gastroenterologist can be reached at any hour by calling [phone]. Do not use MyChart messaging for urgent concerns.    DIET:  We do recommend a small meal at first, but then you may proceed to your regular diet.  Drink plenty of fluids but you should avoid alcoholic beverages for 24 hours.  ACTIVITY:  You should plan to take  it easy for the rest of today and you should NOT DRIVE or use heavy machinery until tomorrow (because of the sedation medicines used during the test).    FOLLOW UP: Our staff will call the number listed on your records 48-72 hours following your procedure to check on you and address any questions or concerns that you may have regarding the information given to you following your procedure. If we do not reach you, we will leave a message.  We will attempt to reach you two times.  During this call, we will ask if you have developed any symptoms of COVID 19. If you develop any symptoms (ie: fever, flu-like symptoms, shortness of breath, cough etc.) before then, please call [phone].  If you test positive for Covid 19 in the 2 weeks post procedure, please call and report this information to [place].    If any biopsies were taken you will be contacted by phone or by letter within the next 1-3 weeks.  Please call us at [phone] if you have not heard about the biopsies in 3 weeks.    SIGNATURES/CONFIDENTIALITY: You and/or your care partner have signed paperwork which will be entered into your electronic medical record.  These signatures attest to the fact that that the information above on your After Visit Summary has been reviewed and is understood.  Full responsibility of the confidentiality of this discharge information lies with  you and/or your care-partner.

## 2019-12-06 NOTE — Op Note (Signed)
[facility] Patient Name: Donna Moon Procedure Date: 12/06/2019 9:55 AM MRN: [id number] Endoscopist: Jerene Bears , MD Age: 69 Referring MD:  Date of Birth: 1950-10-24 Gender: Female Account #: [id number] Procedure:                Upper GI endoscopy Indications:              Epigastric abdominal pain over the last 2-3 months                            with no response to PPI trial and negative CT scan                            of the abdomen/pelvis Medicines:                Monitored Anesthesia Care Procedure:                Pre-Anesthesia Assessment:                           - Prior to the procedure, a History and Physical                            was performed, and patient medications and                            allergies were reviewed. The patient's tolerance of                            previous anesthesia was also reviewed. The risks                            and benefits of the procedure and the sedation                            options and risks were discussed with the patient.                            All questions were answered, and informed consent                            was obtained. Prior Anticoagulants: The patient has                            taken no previous anticoagulant or antiplatelet                            agents. ASA Grade Assessment: II - A patient with                            mild systemic disease. After reviewing the risks                            and benefits, the patient was deemed in  satisfactory condition to undergo the procedure.                           After obtaining informed consent, the endoscope was                            passed under direct vision. Throughout the                            procedure, the patient's blood pressure, pulse, and                            oxygen saturations were monitored continuously. The                            Endoscope was introduced through the  mouth, and                            advanced to the second part of duodenum. The upper                            GI endoscopy was accomplished without difficulty.                            The patient tolerated the procedure well. Scope In: Scope Out: Findings:                 The examined esophagus was normal.                           Moderate inflammation characterized by adherent                            blood, congestion (edema), erosions and shallow                            ulcerations was found in the cardia, in the gastric                            fundus, in the gastric body and in the gastric                            antrum. There was an area of mucosal thickening in                            the fundus (Jar 3). Biopsies were taken with a cold                            forceps for histology and Helicobacter pylori                            testing (gastric body, antrum and incisura Jar 2).  Patchy moderate inflammation characterized by                            erosions, erythema and shallow ulcerations was                            found in the duodenal bulb and in the second                            portion of the duodenum. The inflammation extends                            well into the duodenum to the extent of today's                            exam. Biopsies were taken with a cold forceps for                            histology. Complications:            No immediate complications. Estimated Blood Loss:     Estimated blood loss was minimal. Impression:               - Normal esophagus.                           - Gastritis. Biopsied.                           - Duodenitis. Biopsied. Recommendation:           - Patient has a contact number available for                            emergencies. The signs and symptoms of potential                            delayed complications were discussed with the                             patient. Return to normal activities tomorrow.                            Written discharge instructions were provided to the                            patient.                           - Resume previous diet.                           - Continue present medications.                           - Would resume PPI (pantoprazole at 40 mg daily)  given inflammation seen today. Biopsies to exclude                            H. Pylori and other potential causes of                            gastroduodenitis. Avoid NSAIDs given inflammation                            seen today.                           - Await pathology results. Jerene Bears, MD 12/06/2019 10:22:45 AM This report has been signed electronically.

## 2019-12-08 ENCOUNTER — Telehealth: Payer: Self-pay | Admitting: *Deleted

## 2019-12-08 NOTE — Telephone Encounter (Signed)
  Follow up Call-  Call back number 12/06/2019 03/02/2018  Post procedure Call Back phone  # [phone]  Permission to leave phone message Yes Yes  Some recent data might be hidden     Patient questions:  Do you have a fever, pain , or abdominal swelling? No. Pain Score  0 *  Have you tolerated food without any problems? Yes.    Have you been able to return to your normal activities? Yes.    Do you have any questions about your discharge instructions: Diet   No. Medications  No. Follow up visit  No.  Do you have questions or concerns about your Care? No.  Actions: * If pain score is 4 or above: No action needed, pain <4.  1. Have you developed a fever since your procedure? no  2.   Have you had an respiratory symptoms (SOB or cough) since your procedure? no  3.   Have you tested positive for COVID 19 since your procedure no  4.   Have you had any family members/close contacts diagnosed with the COVID 19 since your procedure?  no   If yes to any of these questions please route to Joylene John, RN and Joella Prince, RN

## 2019-12-13 ENCOUNTER — Encounter: Payer: Self-pay | Admitting: Internal Medicine

## 2020-01-28 ENCOUNTER — Encounter: Payer: Self-pay | Admitting: Internal Medicine

## 2020-01-28 ENCOUNTER — Ambulatory Visit: Payer: Medicare PPO | Admitting: Internal Medicine

## 2020-01-28 VITALS — BP 140/78 | HR 68 | Ht 66.0 in | Wt 247.1 lb

## 2020-01-28 DIAGNOSIS — K2970 Gastritis, unspecified, without bleeding: Secondary | ICD-10-CM | POA: Diagnosis not present

## 2020-01-28 DIAGNOSIS — K2980 Duodenitis without bleeding: Secondary | ICD-10-CM

## 2020-01-28 DIAGNOSIS — R197 Diarrhea, unspecified: Secondary | ICD-10-CM

## 2020-01-28 MED ORDER — METRONIDAZOLE 250 MG PO TABS: 250.0000 mg | ORAL_TABLET | Freq: Three times a day (TID) | ORAL | 0 refills | Status: DC

## 2020-01-28 NOTE — Progress Notes (Signed)
Donna Moon 69 y.o. [birth year] [id number]  Assessment & Plan:   Encounter Diagnoses  Name Primary?  . Gastritis without bleeding, unspecified chronicity, unspecified gastritis type Yes  . Duodenitis   . Diarrhea of presumed infectious origin     Gastritis and duodenitis are symptomatically better.  Diarrhea of unclear etiology though question of infectious or perhaps as opposed infectious IBS or small intestinal bacterial overgrowth situation.  We talked about testing for infection versus empiric treatment and have decided on empiric metronidazole.  She will take 2 months of PPI and then will stop.  Meds ordered this encounter  Medications  . metroNIDAZOLE (FLAGYL) 250 MG tablet    Sig: Take 1 tablet (250 mg total) by mouth 3 (three) times daily.    Dispense:  30 tablet    Refill:  0     She will contact me when antibiotic treatment is completed.  Further plans pending response or lack thereof.   I appreciate the opportunity to care for this patient. CC: Joyice Faster, FNP   Subjective:   Chief Complaint: Abdominal pain and diarrhea  HPI Old white woman who has had problems since she ate at a restaurant earlier this year.  Originally was more of upper abdominal discomfort and tenderness.  She underwent EGD on 12/06/2019 mildly erosive gastritis and duodenitis.  Started on PPI which is helped upper abdominal pain.  Biopsies as below  FINAL DIAGNOSIS Diagnosis 1. Surgical [P], small bowel - DUODENAL MUCOSA WITH WITH FOCAL CRYPT ARCHITECTURE AND REACTIVE CHANGES, SEE NOTE - NEGATIVE FOR INCREASED INTRAEPITHELIAL LYMPHOCYTES 2. Surgical [P], gastric antrum and gastric body - GASTRIC OXYNTIC MUCOSA WITH NO SPECIFIC HISTOPATHOLOGIC CHANGES - WARTHIN STARRY STAIN IS NEGATIVE FOR HELICOBACTER PYLORI 3. Surgical [P], stomach, cardia and fundus - GASTRIC OXYNTIC AND CARDIAC MUCOSA WITH NO SPECIFIC HISTOPATHOLOGIC CHANGES - WARTHIN STARRY STAIN IS NEGATIVE FOR  HELICOBACTER PYLORI   As stated feels like her upper abdominal pain is better.  But she is having more problems with diarrhea bowel movements are soft and intermittently "explosive" with horrible malodorous flatus.  She does not think adding omeprazole made that happen.  I had not done stool studies in the past because she was not having so much diarrhea that I was aware of and she agrees with that.  There is been an evolution.  Last colonoscopy 2019 for screening with 1 adenoma and 1 hyperplastic polyp both diminutive.  HPI from 10/26/2019 Patient is here with complaints of epigastric pain and right upper quadrant pain that started several hours after eating fast food at a restaurant in mid June.  Been relatively constant since then waxing and waning.  Went to an urgent care after she called [place] about this and I suggested she have a more immediate evaluation.  She said she had labs done that were unremarkable and they put her on PPI pantoprazole for 30 days and it really did not make any difference.  She has been noting a little bit of alteration in bowel habits with some constipation and then loose stools off and on.  Last colonoscopy December 2019. She had diverticulosis and a diminutive adenoma.  She has not had problems like this before.  She has lost about 6 pounds unintentionally.  Sometimes she feels full when she eats sooner than before but not on a regular basis.      Allergies    Allergies  Allergen Reactions  . Lexapro [Escitalopram Oxalate] Other (See Comments)    Flu  like symptoms   . Xarelto [Rivaroxaban] Itching   Current Meds  Medication Sig  . acetaminophen (TYLENOL) 500 MG tablet Take 1,000 mg by mouth daily as needed for moderate pain or headache.   . albuterol (PROVENTIL) (2.5 MG/3ML) 0.083% nebulizer solution Take 2.5 mg by nebulization every 6 (six) hours as needed for wheezing or shortness of breath.  Marland Kitchen aspirin EC 81 MG tablet Take 81 mg by mouth daily.   . Calcium  Carb-Cholecalciferol (CALCIUM 600+D3 PO) Take 1 tablet by mouth daily.  . hydrocortisone 2.5 % ointment   . ibuprofen (ADVIL,MOTRIN) 800 MG tablet Take 800 mg by mouth daily as needed (for pain).   . metoprolol tartrate (LOPRESSOR) 25 MG tablet Take 1 tablet (25 mg total) by mouth every morning AND 2 tablets (50 mg total) every evening.  Marland Kitchen omeprazole (PRILOSEC) 40 MG capsule Take 1 capsule (40 mg total) by mouth daily.  . Probiotic Product (PROBIOTIC PO) Take 1 tablet by mouth daily.  . propafenone (RYTHMOL) 150 MG tablet Take 1 tablet (150 mg total) by mouth every 8 (eight) hours.   Past Medical History:  Diagnosis Date  . Allergy   . Arthritis   . Asthma   . Atrial fibrillation ([place])   . Blood transfusion without reported diagnosis    as a child--age 89  . Cancer [facility])    Mohs surgery for skin cancer on right side of nose   . COVID-19 01/2019  . Dizziness   . Dyspareunia   . Dysrhythmia    atrial fib  . Heart murmur   . Hematuria    hx of has been worked up and no problems   . Hx of adenomatous polyp of colon 03/06/2018  . Hx of seasonal allergies    tx. Allegra, Flonase  . Obesity   . Osteoporosis   . Pneumonia    hx of   . PONV (postoperative nausea and vomiting)    however no problems with past surgeries here.   Past Surgical History:  Procedure Laterality Date  . APPENDECTOMY  1968  . CARDIAC ELECTROPHYSIOLOGY STUDY AND ABLATION  1997 & 2011   x 2  . Carpel Tunnel Bilateral 1993  . CERVICAL CONE BIOPSY     laparoscopic BSO--endometrioma  . CESAREAN SECTION  1986  . COLONOSCOPY  2009   Dr. Algis Greenhouse [place]  . LEFT HEART CATH AND CORONARY ANGIOGRAPHY N/A 07/29/2016   Procedure: Left Heart Cath and Coronary Angiography;  Surgeon: Martinique, Peter M, MD;  Location: [facility];  Service: Cardiovascular;  Laterality: N/A;  . OOPHORECTOMY  2009  . REVERSE SHOULDER ARTHROPLASTY Left 02/29/2016   Procedure: REVERSE SHOULDER ARTHROPLASTY;  Surgeon: Justice Britain, MD;   Location: [facility];  Service: Orthopedics;  Laterality: Left;  . REVERSE SHOULDER ARTHROPLASTY Right 06/26/2017   Procedure: RIGHT REVERSE SHOULDER ARTHROPLASTY;  Surgeon: Justice Britain, MD;  Location: [facility];  Service: Orthopedics;  Laterality: Right;  . ROTATOR CUFF REPAIR Right 1999   x2 -'97, 99  . SUPERFICIAL PERONEAL NERVE RELEASE Right    Right elbow nerve release  . TOTAL HIP ARTHROPLASTY  12/27/2010   x2 replacements, x1 repair.  Marland Kitchen TOTAL HIP ARTHROPLASTY Right 03/08/2015   Procedure: TOTAL RIGHT  HIP ARTHROPLASTY ANTERIOR APPROACH;  Surgeon: Gaynelle Arabian, MD;  Location: [facility];  Service: Orthopedics;  Laterality: Right;  . TOTAL KNEE ARTHROPLASTY Left 12/07/2012   Procedure: LEFT TOTAL KNEE ARTHROPLASTY;  Surgeon: Gearlean Alf, MD;  Location: [facility];  Service: Orthopedics;  Laterality: Left;  . UPPER GASTROINTESTINAL ENDOSCOPY  2009   [place], [place]   Social History   Social History Narrative   Married, retired.  One child.   Former smoker no alcohol tobacco or drug use currently   family history includes Atrial fibrillation in her mother; Cancer in her sister; Cancer (age of onset: 104) in her brother; Colon polyps in her brother and brother; Diabetes in her maternal grandmother, paternal grandmother, sister, and sister; Heart attack in her brother and father; Irritable bowel syndrome in her sister; Kidney disease in her mother; Lupus in her sister; Lymphoma in her father; Ovarian cancer (age of onset: 87) in her mother; Stroke in her mother; Ulcerative colitis in her mother.   Review of Systems  As per HPI Objective:   Physical Exam BP 140/78 (BP Location: Left Arm, Patient Position: Sitting, Cuff Size: Large)   Pulse 68   Ht 5\' 6"  (1.676 m) Comment: height measured without shoes  Wt 247 lb 2 oz (112.1 kg)   LMP 03/18/2001   BMI 39.89 kg/m

## 2020-01-28 NOTE — Patient Instructions (Signed)
Per Dr Carlean Purl stop your omeprazole when your rx runs out.    Call and update [place] on your progress.   We have sent the following medications to your pharmacy for you to pick up at your convenience: Flagyl.  I appreciate the opportunity to care for you. Silvano Rusk, MD, [facility]

## 2020-08-21 NOTE — Progress Notes (Signed)
70 y.o. G73P1001 Married Caucasian female here for annual exam.    No further bleeding.  Not really having intercourse now, which has prompted bleeding in the past.  Has some hesitancy to void.  Has elevated creatinine.   Lupus has flared up.   [facility]  Delivers meals on wheels.   Lost several family members in the last year.  Just lost someone this week.  Has done counseling in the past.  PCP:   Dr. Theda Sers, [facility] in [place].   Patient's last menstrual period was 03/18/2001.           Sexually active: Yes.    The current method of family planning is post menopausal status.    Exercising: Yes.    ymca Smoker:  no  Health Maintenance: Pap:  08-05-18 neg, 07-03-17 neg 07-03-16 neg History of abnormal Pap:  Yes conization MMG:  2021 waiting for fax Colonoscopy:  03-02-18 polyps f/u 2024 BMD:   2016  Result  Unable to perform due to multiple fractures & several hip/knee replacements TDaP:  2014 Gardasil:   no HIV:neg 2017 Hep C: neg 2017 Screening Labs:  PCP.    reports that she quit smoking about 34 years ago. Her smoking use included cigarettes. She has a 4.50 pack-year smoking history. She has never used smokeless tobacco. She reports that she does not drink alcohol and does not use drugs.  Past Medical History:  Diagnosis Date  . Allergy   . Arthritis   . Asthma   . Atrial fibrillation ([place])   . Blood transfusion without reported diagnosis    as a child--age 50  . Cancer [facility])    Mohs surgery for skin cancer on right side of nose   . COVID-19 01/2019  . Dizziness   . Dyspareunia   . Dysrhythmia    atrial fib  . Elevated serum creatinine   . Heart murmur   . Hematuria    hx of has been worked up and no problems   . Hx of adenomatous polyp of colon 03/06/2018  . Hx of seasonal allergies    tx. Allegra, Flonase  . Obesity   . Osteoporosis   . Pneumonia    hx of   . PONV (postoperative nausea and vomiting)    however no  problems with past surgeries here.    Past Surgical History:  Procedure Laterality Date  . APPENDECTOMY  1968  . CARDIAC ELECTROPHYSIOLOGY STUDY AND ABLATION  1997 & 2011   x 2  . Carpel Tunnel Bilateral 1993  . CERVICAL CONE BIOPSY     laparoscopic BSO--endometrioma  . CESAREAN SECTION  1986  . COLONOSCOPY  2009   Dr. Algis Greenhouse [place]  . LEFT HEART CATH AND CORONARY ANGIOGRAPHY N/A 07/29/2016   Procedure: Left Heart Cath and Coronary Angiography;  Surgeon: Martinique, Peter M, MD;  Location: [place] [facility];  Service: Cardiovascular;  Laterality: N/A;  . OOPHORECTOMY  2009  . REVERSE SHOULDER ARTHROPLASTY Left 02/29/2016   Procedure: REVERSE SHOULDER ARTHROPLASTY;  Surgeon: Justice Britain, MD;  Location: [facility];  Service: Orthopedics;  Laterality: Left;  . REVERSE SHOULDER ARTHROPLASTY Right 06/26/2017   Procedure: RIGHT REVERSE SHOULDER ARTHROPLASTY;  Surgeon: Justice Britain, MD;  Location: [facility];  Service: Orthopedics;  Laterality: Right;  . ROTATOR CUFF REPAIR Right 1999   x2 -'97, 99  . SUPERFICIAL PERONEAL NERVE RELEASE Right    Right elbow nerve release  . TOTAL HIP ARTHROPLASTY  12/27/2010  x2 replacements, x1 repair.  Marland Kitchen TOTAL HIP ARTHROPLASTY Right 03/08/2015   Procedure: TOTAL RIGHT  HIP ARTHROPLASTY ANTERIOR APPROACH;  Surgeon: Gaynelle Arabian, MD;  Location: [facility];  Service: Orthopedics;  Laterality: Right;  . TOTAL KNEE ARTHROPLASTY Left 12/07/2012   Procedure: LEFT TOTAL KNEE ARTHROPLASTY;  Surgeon: Gearlean Alf, MD;  Location: [facility];  Service: Orthopedics;  Laterality: Left;  . UPPER GASTROINTESTINAL ENDOSCOPY  2009   Shiflett, NL    Current Outpatient Medications  Medication Sig Dispense Refill  . acetaminophen (TYLENOL) 500 MG tablet Take 1,000 mg by mouth daily as needed for moderate pain or headache.     . albuterol (PROVENTIL) (2.5 MG/3ML) 0.083% nebulizer solution Take 2.5 mg by nebulization every 6 (six) hours as needed for wheezing or shortness of breath.    Marland Kitchen  aspirin EC 81 MG tablet Take 81 mg by mouth daily.     . Calcium Carb-Cholecalciferol (CALCIUM 600+D3 PO) Take 1 tablet by mouth daily.    Marland Kitchen ibuprofen (ADVIL,MOTRIN) 800 MG tablet Take 800 mg by mouth daily as needed (for pain).     . metoprolol tartrate (LOPRESSOR) 25 MG tablet Take 1 tablet (25 mg total) by mouth every morning AND 2 tablets (50 mg total) every evening. 270 tablet 3  . Probiotic Product (PROBIOTIC PO) Take 1 tablet by mouth daily.    . propafenone (RYTHMOL) 150 MG tablet Take 1 tablet (150 mg total) by mouth every 8 (eight) hours. (Patient taking differently: Take 150 mg by mouth every 8 (eight) hours. 3 times daily) 270 tablet 3   No current facility-administered medications for this visit.    Family History  Problem Relation Age of Onset  . Stroke Mother   . Ovarian cancer Mother 71       dec uterine or ovarian ca  . Ulcerative colitis Mother   . Kidney disease Mother   . Atrial fibrillation Mother   . Heart attack Father   . Lymphoma Father   . Heart attack Brother   . Colon polyps Brother   . Diabetes Sister        AODM  . Diabetes Sister        AODM  . Irritable bowel syndrome Sister   . Cancer Sister        kidney cancer  . Diabetes Maternal Grandmother   . Diabetes Paternal Grandmother   . Lupus Sister        dec age 32 complications from Lupus  . Cancer Brother 87       melanoma  . Colon polyps Brother   . Esophageal cancer Neg Hx   . Rectal cancer Neg Hx   . Stomach cancer Neg Hx   . Colon cancer Neg Hx     Review of Systems  Constitutional: Negative.   HENT: Negative.   Eyes: Negative.   Respiratory: Negative.   Cardiovascular: Negative.   Gastrointestinal: Negative.   Endocrine: Negative.   Genitourinary: Negative.   Musculoskeletal: Negative.   Skin: Negative.   Allergic/Immunologic: Negative.   Neurological: Negative.   Hematological: Negative.   Psychiatric/Behavioral: Negative.     Exam:   BP 106/76   Pulse 65   Resp 16    Ht 5' 5.75" (1.67 m)   Wt 249 lb (112.9 kg)   LMP 03/18/2001   BMI 40.50 kg/m     General appearance: alert, cooperative and appears stated age Head: normocephalic, without obvious abnormality, atraumatic Lungs: clear to auscultation bilaterally Breasts: normal  appearance, no masses or tenderness, No nipple retraction or dimpling, No nipple discharge or bleeding, No axillary adenopathy Heart: regular rate and rhythm Abdomen: soft, non-tender; no masses, no organomegaly Extremities: extremities normal, atraumatic, no cyanosis or edema Skin: skin color, texture, turgor normal. No rashes or lesions Neurologic: grossly normal  Pelvic: External genitalia:  no lesions              No abnormal inguinal nodes palpated.              Urethra:  normal appearing urethra with no masses, tenderness or lesions              Bartholins and Skenes: normal                 Vagina: normal appearing vagina with normal color and discharge, no lesions              Cervix: no lesions              Pap taken: Yes.   Bimanual Exam:  Uterus:  normal size, contour, position, consistency, mobility, non-tender              Adnexa: no mass, fullness, tenderness              Rectal exam: Yes.  .  Confirms.              Anus:  normal sphincter tone, no lesions  Chaperone was present for exam.  Assessment:   Screening breast exam. Pelvic exam with abnormal finding present.  Vaginal atrophy. Cervical cancer screening.  Hx cervical cone biopsy.  Hx lap BSO - endometrioma. FH ovarian/uterine cancerin mother. History of post coital spotting with a negative pelvic US.  Encounter for rectal exam.  Bereavement.  Hx atrial fibrillation. Elevated creatinine.   Plan: Mammogram screening discussed. Self breast awareness reviewed. Pap and reflex HR HPV as above. Guidelines for Calcium, Vitamin D, regular exercise program including cardiovascular and weight bearing exercise. Bereavement support given.  I  encouraged Hospice support for loss.  Next routine office visit in 2 years and problem visits prn.  30 min  total time was spent for this patient encounter, including preparation, face-to-face counseling with the patient, coordination of care, and documentation of the encounter.

## 2020-08-23 ENCOUNTER — Other Ambulatory Visit: Payer: Self-pay

## 2020-08-23 ENCOUNTER — Ambulatory Visit (INDEPENDENT_AMBULATORY_CARE_PROVIDER_SITE_OTHER): Payer: Medicare PPO | Admitting: Obstetrics and Gynecology

## 2020-08-23 ENCOUNTER — Encounter: Payer: Self-pay | Admitting: Obstetrics and Gynecology

## 2020-08-23 ENCOUNTER — Other Ambulatory Visit (HOSPITAL_COMMUNITY)
Admission: RE | Admit: 2020-08-23 | Discharge: 2020-08-23 | Disposition: A | Discharge status: Home / Self Care | Payer: Medicare PPO | Source: Ambulatory Visit | Attending: Obstetrics and Gynecology | Admitting: Obstetrics and Gynecology

## 2020-08-23 VITALS — BP 106/76 | HR 65 | Resp 16 | Ht 65.75 in | Wt 249.0 lb

## 2020-08-23 DIAGNOSIS — Z01411 Encounter for gynecological examination (general) (routine) with abnormal findings: Secondary | ICD-10-CM

## 2020-08-23 DIAGNOSIS — Z1239 Encounter for other screening for malignant neoplasm of breast: Secondary | ICD-10-CM

## 2020-08-23 DIAGNOSIS — Z124 Encounter for screening for malignant neoplasm of cervix: Secondary | ICD-10-CM

## 2020-08-23 DIAGNOSIS — Z008 Encounter for other general examination: Secondary | ICD-10-CM

## 2020-08-23 DIAGNOSIS — N952 Postmenopausal atrophic vaginitis: Secondary | ICD-10-CM

## 2020-08-23 DIAGNOSIS — Z634 Disappearance and death of family member: Secondary | ICD-10-CM

## 2020-08-23 NOTE — Patient Instructions (Addendum)
EXERCISE AND DIET:  We recommended that you start or continue a regular exercise program for good health. Regular exercise means any activity that makes your heart beat faster and makes you sweat.  We recommend exercising at least 30 minutes per day at least 3 days a week, preferably 4 or 5.  We also recommend a diet low in fat and sugar.  Inactivity, poor dietary choices and obesity can cause diabetes, heart attack, stroke, and kidney damage, among others.    ALCOHOL AND SMOKING:  Women should limit their alcohol intake to no more than 7 drinks/beers/glasses of wine (combined, not each!) per week. Moderation of alcohol intake to this level decreases your risk of breast cancer and liver damage. And of course, no recreational drugs are part of a healthy lifestyle.  And absolutely no smoking or even second hand smoke. Most people know smoking can cause heart and lung diseases, but did you know it also contributes to weakening of your bones? Aging of your skin?  Yellowing of your teeth and nails?  CALCIUM AND VITAMIN D:  Adequate intake of calcium and Vitamin D are recommended.  The recommendations for exact amounts of these supplements seem to change often, but generally speaking 600 mg of calcium (either carbonate or citrate) and 800 units of Vitamin D per day seems prudent. Certain women may benefit from higher intake of Vitamin D.  If you are among these women, your doctor will have told you during your visit.    PAP SMEARS:  Pap smears, to check for cervical cancer or precancers,  have traditionally been done yearly, although recent scientific advances have shown that most women can have pap smears less often.  However, every woman still should have a physical exam from her gynecologist every year. It will include a breast check, inspection of the vulva and vagina to check for abnormal growths or skin changes, a visual exam of the cervix, and then an exam to evaluate the size and shape of the uterus and  ovaries.  And after 70 years of age, a rectal exam is indicated to check for rectal cancers. We will also provide age appropriate advice regarding health maintenance, like when you should have certain vaccines, screening for sexually transmitted diseases, bone density testing, colonoscopy, mammograms, etc.   MAMMOGRAMS:  All women over 40 years old should have a yearly mammogram. Many facilities now offer a "3D" mammogram, which may cost around $50 extra out of pocket. If possible,  we recommend you accept the option to have the 3D mammogram performed.  It both reduces the number of women who will be called back for extra views which then turn out to be normal, and it is better than the routine mammogram at detecting truly abnormal areas.    COLONOSCOPY:  Colonoscopy to screen for colon cancer is recommended for all women at age 50.  We know, you hate the idea of the prep.  We agree, BUT, having colon cancer and not knowing it is worse!!  Colon cancer so often starts as a polyp that can be seen and removed at colonscopy, which can quite literally save your life!  And if your first colonoscopy is normal and you have no family history of colon cancer, most women don't have to have it again for 10 years.  Once every ten years, you can do something that may end up saving your life, right?  We will be happy to help you get it scheduled when you are ready.    Be sure to check your insurance coverage so you understand how much it will cost.  It may be covered as a preventative service at no cost, but you should check your particular policy.      Managing Loss, Adult People experience loss in many different ways throughout their lives. Events such as moving, changing jobs, and losing friends can create a sense of loss. The loss may be as serious as a major health change, divorce, death of a pet, or death of a loved one. All of these types of loss are likely to create a physical and emotional reaction known as grief.  Grief is the result of a major change or an absence of something or someone that you count on. Grief is a normal reaction to loss. A variety of factors can affect your grieving experience, including:  The nature of your loss.  Your relationship to what or whom you lost.  Your understanding of grief and how to manage it.  Your support system. How to manage lifestyle changes Keep to your normal routine as much as possible.  If you have trouble focusing or doing normal activities, it is acceptable to take some time away from your normal routine.  Spend time with friends and loved ones.  Eat a healthy diet, get plenty of sleep, and rest when you feel tired.   How to recognize changes  The way that you deal with your grief will affect your ability to function as you normally do. When grieving, you may experience these changes:  Numbness, shock, sadness, anxiety, anger, denial, and guilt.  Thoughts about death.  Unexpected crying.  A physical sensation of emptiness in your stomach.  Problems sleeping and eating.  Tiredness (fatigue).  Loss of interest in normal activities.  Dreaming about or imagining seeing the person who died.  A need to remember what or whom you lost.  Difficulty thinking about anything other than your loss for a period of time.  Relief. If you have been expecting the loss for a while, you may feel a sense of relief when it happens. Follow these instructions at home: Activity Express your feelings in healthy ways, such as:  Talking with others about your loss. It may be helpful to find others who have had a similar loss, such as a support group.  Writing down your feelings in a journal.  Doing physical activities to release stress and emotional energy.  Doing creative activities like painting, sculpting, or playing or listening to music.  Practicing resilience. This is the ability to recover and adjust after facing challenges. Reading some resources  that encourage resilience may help you to learn ways to practice those behaviors.   General instructions  Be patient with yourself and others. Allow the grieving process to happen, and remember that grieving takes time. ? It is likely that you may never feel completely done with some grief. You may find a way to move on while still cherishing memories and feelings about your loss. ? Accepting your loss is a process. It can take months or longer to adjust.  Keep all follow-up visits as told by your health care provider. This is important. Where to find support To get support for managing loss:  Ask your health care provider for help and recommendations, such as grief counseling or therapy.  Think about joining a support group for people who are managing a loss. Where to find more information You can find more information about managing loss from:  American Society of Clinical Oncology: www.cancer.net  American Psychological Association: TVStereos.ch Contact a health care provider if:  Your grief is extreme and keeps getting worse.  You have ongoing grief that does not improve.  Your body shows symptoms of grief, such as illness.  You feel depressed, anxious, or lonely. Get help right away if:  You have thoughts about hurting yourself or others. If you ever feel like you may hurt yourself or others, or have thoughts about taking your own life, get help right away. You can go to your nearest emergency department or call:  Your local emergency services (911 in the [place]  A suicide crisis helpline, such as the [facility] at 2530112455. This is open 24 hours a day. Summary  Grief is the result of a major change or an absence of someone or something that you count on. Grief is a normal reaction to loss.  The depth of grief and the period of recovery depend on the type of loss and your ability to adjust to the change and process your  feelings.  Processing grief requires patience and a willingness to accept your feelings and talk about your loss with people who are supportive.  It is important to find resources that work for you and to realize that people experience grief differently. There is not one grieving process that works for everyone in the same way.  Be aware that when grief becomes extreme, it can lead to more severe issues like isolation, depression, anxiety, or suicidal thoughts. Talk with your health care provider if you have any of these issues. This information is not intended to replace advice given to you by your health care provider. Make sure you discuss any questions you have with your health care provider. Document Revised: 08/26/2019 Document Reviewed: 08/26/2019 Elsevier Patient Education  [place].

## 2020-08-25 LAB — CYTOLOGY - PAP: Diagnosis: NEGATIVE

## 2020-12-06 ENCOUNTER — Ambulatory Visit: Payer: Medicare PPO | Admitting: Cardiovascular Disease

## 2020-12-06 ENCOUNTER — Other Ambulatory Visit: Payer: Self-pay

## 2020-12-06 ENCOUNTER — Encounter: Payer: Self-pay | Admitting: Cardiovascular Disease

## 2020-12-06 VITALS — BP 150/78 | HR 63 | Ht 67.0 in | Wt 247.6 lb

## 2020-12-06 DIAGNOSIS — Z79899 Other long term (current) drug therapy: Secondary | ICD-10-CM | POA: Diagnosis not present

## 2020-12-06 DIAGNOSIS — I471 Supraventricular tachycardia: Secondary | ICD-10-CM

## 2020-12-06 DIAGNOSIS — R030 Elevated blood-pressure reading, without diagnosis of hypertension: Secondary | ICD-10-CM

## 2020-12-06 DIAGNOSIS — Z6835 Body mass index (BMI) 35.0-35.9, adult: Secondary | ICD-10-CM

## 2020-12-06 NOTE — Patient Instructions (Signed)

## 2020-12-06 NOTE — Progress Notes (Signed)
Patient ID: Donna Moon, female   DOB: 15-Oct-1950, 70 y.o.   MRN: [id number]    Cardiology Office Note    Date:  12/09/2020   12 months or:  Donna, Moon 02-21-1951, MRN [id number]  PCP:  Coolidge Breeze, FNP  Cardiologist:   Sanda Klein, MD   Chief Complaint  Patient presents with   Palpitations    History of Present Illness:  Donna Moon is a 70 y.o. female with a history of paroxysmal atrial fibrillation with 2 previous successful ablation procedures and some residual palpitations presumed to be secondary to recurrent paroxysmal atrial arrhythmia. Symptoms are very well controlled on metoprolol and propafenone.  In 2018 a false positive ECG stress stress led to coronary angiography, with normal results.  Since her ablation, we really have not documented atrial flutter or atrial fibrillation, her symptoms seem to be related to frequent isolated PACs and very brief bursts of paroxysmal atrial tachycardia.  She is generally satisfied with the level of control of her palpitations although she has occasional spells less than weekly, lasting for 10 to 15 minutes at a time.  They are not associated with severe symptoms and she can usually calm them down if she sits down and relaxes for a few minutes.  The patient specifically denies any chest pain at rest exertion, dyspnea at rest or with exertion, orthopnea, paroxysmal nocturnal dyspnea, syncope, focal neurological deficits, intermittent claudication, lower extremity edema, unexplained weight gain, cough, hemoptysis or wheezing.  She continues to be physically active, volunteering at the [facility] and going to water aerobics regularly.  ECG today shows normal sinus rhythm, QRS 98 ms, QTC 442 ms.  She does not have a history of stroke/TIA and has low embolic risk profile (CHADSvasc 1-2 for age 89 and female gender only).   Past Medical History:  Diagnosis Date   Allergy    Arthritis    Asthma    Atrial fibrillation ([place])    Blood  transfusion without reported diagnosis    as a child--age 76   Cancer ([place])    Mohs surgery for skin cancer on right side of nose    COVID-19 01/2019   Dizziness    Dyspareunia    Dysrhythmia    atrial fib   Elevated serum creatinine    Heart murmur    Hematuria    hx of has been worked up and no problems    Hx of adenomatous polyp of colon 03/06/2018   Hx of seasonal allergies    tx. Allegra, Flonase   Obesity    Osteoporosis    Pneumonia    hx of    PONV (postoperative nausea and vomiting)    however no problems with past surgeries here.    Past Surgical History:  Procedure Laterality Date   APPENDECTOMY  1968   CARDIAC ELECTROPHYSIOLOGY STUDY AND ABLATION  1997 & 2011   x 2   Carpel Tunnel Bilateral 1993   CERVICAL CONE BIOPSY     laparoscopic BSO--endometrioma   CESAREAN SECTION  1986   COLONOSCOPY  2009   Dr. Algis Greenhouse NL   LEFT HEART CATH AND CORONARY ANGIOGRAPHY N/A 07/29/2016   Procedure: Left Heart Cath and Coronary Angiography;  Surgeon: Martinique, Peter M, MD;  Location: [facility];  Service: Cardiovascular;  Laterality: N/A;   OOPHORECTOMY  2009   REVERSE SHOULDER ARTHROPLASTY Left 02/29/2016   Procedure: REVERSE SHOULDER ARTHROPLASTY;  Surgeon: Justice Britain, MD;  Location: [facility];  Service: Orthopedics;  Laterality: Left;   REVERSE SHOULDER ARTHROPLASTY Right 06/26/2017   Procedure: RIGHT REVERSE SHOULDER ARTHROPLASTY;  Surgeon: Justice Britain, MD;  Location: [facility];  Service: Orthopedics;  Laterality: Right;   ROTATOR CUFF REPAIR Right 1999   x2 -'97, 99   SUPERFICIAL PERONEAL NERVE RELEASE Right    Right elbow nerve release   TOTAL HIP ARTHROPLASTY  12/27/2010   x2 replacements, x1 repair.   TOTAL HIP ARTHROPLASTY Right 03/08/2015   Procedure: TOTAL RIGHT  HIP ARTHROPLASTY ANTERIOR APPROACH;  Surgeon: Gaynelle Arabian, MD;  [facility];  Service: Orthopedics;  Laterality: Right;   TOTAL KNEE ARTHROPLASTY Left 12/07/2012   Procedure: LEFT TOTAL KNEE  ARTHROPLASTY;  Surgeon: Gearlean Alf, MD;  [facility];  Service: Orthopedics;  Laterality: Left;   UPPER GASTROINTESTINAL ENDOSCOPY  2009   Shiflett, NL    Outpatient Medications Prior to Visit  Medication Sig Dispense Refill   acetaminophen (TYLENOL) 500 MG tablet Take 1,000 mg by mouth daily as needed for moderate pain or headache.      albuterol (PROVENTIL) (2.5 MG/3ML) 0.083% nebulizer solution Take 2.5 mg by nebulization every 6 (six) hours as needed for wheezing or shortness of breath.     aspirin EC 81 MG tablet Take 81 mg by mouth daily.      Calcium Carb-Cholecalciferol (CALCIUM 600+D3 PO) Take 1 tablet by mouth daily.     ibuprofen (ADVIL,MOTRIN) 800 MG tablet Take 800 mg by mouth daily as needed (for pain).      metoprolol tartrate (LOPRESSOR) 25 MG tablet Take 1 tablet (25 mg total) by mouth every morning AND 2 tablets (50 mg total) every evening. 270 tablet 3   Probiotic Product (PROBIOTIC PO) Take 1 tablet by mouth daily.     propafenone (RYTHMOL) 150 MG tablet Take 1 tablet (150 mg total) by mouth every 8 (eight) hours. (Patient taking differently: Take 150 mg by mouth every 8 (eight) hours. 3 times daily) 270 tablet 3   No facility-administered medications prior to visit.     Allergies:   Lexapro [escitalopram oxalate] and Xarelto [rivaroxaban]   Social History   Socioeconomic History   Marital status: Married    Spouse name: Not on file   Number of children: 1   Years of education: Not on file   Highest education level: Not on file  Occupational History   Occupation: retired  Tobacco Use   Smoking status: Former    Packs/day: 0.50    Years: 9.00    Pack years: 4.50    Types: Cigarettes    Quit date: 03/18/1986    Years since quitting: 34.7   Smokeless tobacco: Never  Vaping Use   Vaping Use: Never used  Substance and Sexual Activity   Alcohol use: No    Alcohol/week: 0.0 standard drinks   Drug use: No   Sexual activity: Yes    Partners: Male     Birth control/protection: Post-menopausal  Other Topics Concern   Not on file  Social History Narrative   Married, retired.  One child.   Former smoker no alcohol tobacco or drug use currently   Social Determinants of Radio broadcast assistant Strain: Not on file  Food Insecurity: Not on file  Transportation Needs: Not on file  Physical Activity: Not on file  Stress: Not on file  Social Connections: Not on file     Family History:  The patient's family history includes Atrial fibrillation in her mother; Cancer  in her sister; Cancer (age of onset: 35) in her brother; Colon polyps in her brother and brother; Diabetes in her maternal grandmother, paternal grandmother, sister, and sister; Heart attack in her brother and father; Irritable bowel syndrome in her sister; Kidney disease in her mother; Lupus in her sister; Lymphoma in her father; Ovarian cancer (age of onset: 12) in her mother; Stroke in her mother; Ulcerative colitis in her mother.   ROS:   Please see the history of present illness.    ROS All other systems are reviewed and are negative.   PHYSICAL EXAM:   VS:  BP (!) 150/78   Pulse 63   Ht 5\' 7"  (1.702 m)   Wt 112.3 kg   LMP 03/18/2001   SpO2 98%   BMI 38.78 kg/m      General: Alert, oriented x3, no distress, severely obese Head: no evidence of trauma, PERRL, EOMI, no exophtalmos or lid lag, no myxedema, no xanthelasma; normal ears, nose and oropharynx Neck: normal jugular venous pulsations and no hepatojugular reflux; brisk carotid pulses without delay and no carotid bruits Chest: clear to auscultation, no signs of consolidation by percussion or palpation, normal fremitus, symmetrical and full respiratory excursions Cardiovascular: normal position and quality of the apical impulse, regular rhythm, normal first and second heart sounds, 1/6 early peaking systolic ejection murmur in the aortic focus, no diastolic murmurs, rubs or gallops Abdomen: no tenderness or  distention, no masses by palpation, no abnormal pulsatility or arterial bruits, normal bowel sounds, no hepatosplenomegaly Extremities: no clubbing, cyanosis or edema; 2+ radial, ulnar and brachial pulses bilaterally; 2+ right femoral, posterior tibial and dorsalis pedis pulses; 2+ left femoral, posterior tibial and dorsalis pedis pulses; no subclavian or femoral bruits Neurological: grossly nonfocal Psych: Normal mood and affect  Wt Readings from Last 3 Encounters:  12/06/20 112.3 kg  08/23/20 112.9 kg  01/28/20 112.1 kg    Studies/Labs Reviewed:   EKG:  EKG is ordered today.  It shows normal sinus rhythm with borderline PR interval, sharp Q waves in leads I and aVL and rSr' pattern in V1-V2, but with a normal QRS duration of 98 ms there are no ischemic repolarization abnormalities.  QTc 442 ms Recent Labs: No results found for requested labs within last 8760 hours.  10/26/2019 creatinine 1.25, potassium 4.2  Lipid Panel    Component Value Date/Time   CHOL 192 05/10/2019 0912   TRIG 132 05/10/2019 0912   HDL 57 05/10/2019 0912   CHOLHDL 3.4 05/10/2019 0912   CHOLHDL 3.1 07/25/2016 0909   VLDL 18 07/25/2016 0909   LDLCALC 112 (H) 05/10/2019 0912    ASSESSMENT:    1. PAT (paroxysmal atrial tachycardia) ([place])   2. Severe obesity (BMI 35.0-35.9 with comorbidity) ([place])   3. Long term current use of antiarrhythmic drug   4. Elevated blood pressure reading      PLAN:  In order of problems listed above:   PAT/PAF: Overall excellent symptom control with propafenone and metoprolol, with occasional breakthrough palpitations.  CHA2DS2-VASc 2 (age, gender).  She is not on anticoagulants since we have not documented either atrial flutter or atrial fibrillation in many many years..  Her symptoms recently seem to be due to isolated PACs and/or paroxysmal atrial tachycardia.  Resting bradycardia has generally limited higher doses of beta-blockers, but it remains okay for her to take an  additional as needed dose of metoprolol 25 mg when the palpitations are particularly bad. Obesity: We have discussed the relationship between obesity  and atrial arrhythmia. Propafenone: She has tolerated this medication well without any evidence of proarrhythmia or other complications.  She had normal coronary angiography in 2018 so we have been holding off on routine treadmill stress test.  We will plan to do 1 next year. HLP: She does not have known PAD and did not have CAD had cardiac catheterization in 2018.  Her most recent LDL was 112 with an HDL of 57.  We have not recommended lipid-lowering therapy at this time. Elevated BP: The elevated systolic blood pressure of 150 today is very atypical for her; just a couple of months ago at her previous doctor's office visit her blood pressure was 106/76.  Asked her to keep an eye on her blood pressure with her home monitor and send [place] a message if she sees values over 140.      Medication Adjustments/Labs and Tests Ordered: Current medicines are reviewed at length with the patient today.  Concerns regarding medicines are outlined above.  Medication changes, Labs and Tests ordered today are listed in the Patient Instructions below. Patient Instructions  Medication Instructions:  No changes *If you need a refill on your cardiac medications before your next appointment, please call your pharmacy*   Lab Work: None ordered If you have labs (blood work) drawn today and your tests are completely normal, you will receive your results only by: [place] (if you have MyChart) OR A paper copy in the mail If you have any lab test that is abnormal or we need to change your treatment, we will call you to review the results.   Testing/Procedures: None ordered   Follow-Up: At [facility], you and your health needs are our priority.  As part of our continuing mission to provide you with exceptional heart care, we have created designated Provider  Care Teams.  These Care Teams include your primary Cardiologist (physician) and Advanced Practice Providers (APPs -  Physician Assistants and Nurse Practitioners) who all work together to provide you with the care you need, when you need it.  We recommend signing up for the patient portal called "MyChart".  Sign up information is provided on this After Visit Summary.  MyChart is used to connect with patients for Virtual Visits (Telemedicine).  Patients are able to view lab/test results, encounter notes, upcoming appointments, etc.  Non-urgent messages can be sent to your provider as well.   To learn more about what you can do with MyChart, go to [email or web address].    Your next appointment:   12 month(s)  The format for your next appointment:   In Person  Provider:   You may see Sanda Klein, MD or one of the following Advanced Practice Providers on your designated Care Team:   Almyra Deforest, PA-C Fabian Sharp, [place] or  Roby Lofts, PA-C      Signed, Sanda Klein, MD  12/09/2020 10:09 AM    [address], [place], [place]  [address] Phone: [phone]; Fax: [phone]

## 2020-12-09 ENCOUNTER — Encounter: Payer: Self-pay | Admitting: Cardiovascular Disease

## 2020-12-21 ENCOUNTER — Other Ambulatory Visit: Payer: Self-pay | Admitting: Cardiovascular Disease

## 2021-03-23 ENCOUNTER — Other Ambulatory Visit: Payer: Self-pay | Admitting: Cardiovascular Disease

## 2021-06-18 ENCOUNTER — Other Ambulatory Visit: Payer: Self-pay | Admitting: Cardiovascular Disease

## 2021-08-27 NOTE — Progress Notes (Unsigned)
71 y.o. G86P1001 Married Caucasian female here for annual exam.    PCP: Suzzanne Cloud, FNP  Patient's last menstrual period was 03/18/2001.           Sexually active: {yes no:314532}  The current method of family planning is post menopausal status.    Exercising: {yes no:314532}  {types:19826} Smoker:  no  Health Maintenance: Pap:   08-05-18 neg, 07-03-17 neg , 07-03-16 neg History of abnormal Pap:  Yes, Hx of conization MMG:  10-23-20 Neg/Birads1 Colonoscopy:  *** BMD:  2016  Result  Unable to perform due to multiple fractures & several hip/knee replacements TDaP:  2014 Gardasil:   n/a HIV: 2017 Neg Hep C:2017 Neg Screening Labs:  Hb today: ***, Urine today: ***   reports that she quit smoking about 35 years ago. Her smoking use included cigarettes. She has a 4.50 pack-year smoking history. She has never used smokeless tobacco. She reports that she does not drink alcohol and does not use drugs.  Past Medical History:  Diagnosis Date   Allergy    Arthritis    Asthma    Atrial fibrillation ([place])    Blood transfusion without reported diagnosis    as a child--age 53   Cancer ([place])    Mohs surgery for skin cancer on right side of nose    COVID-19 01/2019   Dizziness    Dyspareunia    Dysrhythmia    atrial fib   Elevated serum creatinine    Heart murmur    Hematuria    hx of has been worked up and no problems    Hx of adenomatous polyp of colon 03/06/2018   Hx of seasonal allergies    tx. Allegra, Flonase   Obesity    Osteoporosis    Pneumonia    hx of    PONV (postoperative nausea and vomiting)    however no problems with past surgeries here.    Past Surgical History:  Procedure Laterality Date   APPENDECTOMY  1968   CARDIAC ELECTROPHYSIOLOGY STUDY AND ABLATION  1997 & 2011   x 2   Carpel Tunnel Bilateral 1993   CERVICAL CONE BIOPSY     laparoscopic BSO--endometrioma   CESAREAN SECTION  1986   COLONOSCOPY  2009   Dr. Algis Greenhouse NL   LEFT HEART CATH AND CORONARY  ANGIOGRAPHY N/A 07/29/2016   Procedure: Left Heart Cath and Coronary Angiography;  Surgeon: Martinique, Peter M, MD;  Location: [facility];  Service: Cardiovascular;  Laterality: N/A;   OOPHORECTOMY  2009   REVERSE SHOULDER ARTHROPLASTY Left 02/29/2016   Procedure: REVERSE SHOULDER ARTHROPLASTY;  Surgeon: Justice Britain, MD;  Location: [facility];  Service: Orthopedics;  Laterality: Left;   REVERSE SHOULDER ARTHROPLASTY Right 06/26/2017   Procedure: RIGHT REVERSE SHOULDER ARTHROPLASTY;  Surgeon: Justice Britain, MD;  Location: [facility];  Service: Orthopedics;  Laterality: Right;   ROTATOR CUFF REPAIR Right 1999   x2 -'97, 99   SUPERFICIAL PERONEAL NERVE RELEASE Right    Right elbow nerve release   TOTAL HIP ARTHROPLASTY  12/27/2010   x2 replacements, x1 repair.   TOTAL HIP ARTHROPLASTY Right 03/08/2015   Procedure: TOTAL RIGHT  HIP ARTHROPLASTY ANTERIOR APPROACH;  Surgeon: Gaynelle Arabian, MD;  [facility];  Service: Orthopedics;  Laterality: Right;   TOTAL KNEE ARTHROPLASTY Left 12/07/2012   Procedure: LEFT TOTAL KNEE ARTHROPLASTY;  Surgeon: Gearlean Alf, MD;  [facility];  Service: Orthopedics;  Laterality: Left;   UPPER GASTROINTESTINAL ENDOSCOPY  2009  Shiflett, NL    Current Outpatient Medications  Medication Sig Dispense Refill   acetaminophen (TYLENOL) 500 MG tablet Take 1,000 mg by mouth daily as needed for moderate pain or headache.      albuterol (PROVENTIL) (2.5 MG/3ML) 0.083% nebulizer solution Take 2.5 mg by nebulization every 6 (six) hours as needed for wheezing or shortness of breath.     aspirin EC 81 MG tablet Take 81 mg by mouth daily.      Calcium Carb-Cholecalciferol (CALCIUM 600+D3 PO) Take 1 tablet by mouth daily.     ibuprofen (ADVIL,MOTRIN) 800 MG tablet Take 800 mg by mouth daily as needed (for pain).      metoprolol tartrate (LOPRESSOR) 25 MG tablet TAKE 1 TABLET BY MOUTH IN THE MORNING AND 2 IN THE EVENING 270 tablet 3   Probiotic Product (PROBIOTIC PO) Take 1  tablet by mouth daily.     propafenone (RYTHMOL) 150 MG tablet TAKE (1) TABLET BY MOUTH EVERY EIGHT HOURS. 270 tablet 3   No current facility-administered medications for this visit.    Family History  Problem Relation Age of Onset   Stroke Mother    Ovarian cancer Mother 39       dec uterine or ovarian ca   Ulcerative colitis Mother    Kidney disease Mother    Atrial fibrillation Mother    Heart attack Father    Lymphoma Father    Heart attack Brother    Colon polyps Brother    Diabetes Sister        AODM   Diabetes Sister        AODM   Irritable bowel syndrome Sister    Cancer Sister        kidney cancer   Diabetes Maternal Grandmother    Diabetes Paternal Grandmother    Lupus Sister        dec age 91 complications from Lupus   Cancer Brother 55       melanoma   Colon polyps Brother    Esophageal cancer Neg Hx    Rectal cancer Neg Hx    Stomach cancer Neg Hx    Colon cancer Neg Hx     Review of Systems  Exam:   LMP 03/18/2001     General appearance: alert, cooperative and appears stated age Head: normocephalic, without obvious abnormality, atraumatic Neck: no adenopathy, supple, symmetrical, trachea midline and thyroid normal to inspection and palpation Lungs: clear to auscultation bilaterally Breasts: normal appearance, no masses or tenderness, No nipple retraction or dimpling, No nipple discharge or bleeding, No axillary adenopathy Heart: regular rate and rhythm Abdomen: soft, non-tender; no masses, no organomegaly Extremities: extremities normal, atraumatic, no cyanosis or edema Skin: skin color, texture, turgor normal. No rashes or lesions Lymph nodes: cervical, supraclavicular, and axillary nodes normal. Neurologic: grossly normal  Pelvic: External genitalia:  no lesions              No abnormal inguinal nodes palpated.              Urethra:  normal appearing urethra with no masses, tenderness or lesions              Bartholins and Skenes: normal                  Vagina: normal appearing vagina with normal color and discharge, no lesions              Cervix: no lesions  Pap taken: {yes no:314532} Bimanual Exam:  Uterus:  normal size, contour, position, consistency, mobility, non-tender              Adnexa: no mass, fullness, tenderness              Rectal exam: {yes no:314532}.  Confirms.              Anus:  normal sphincter tone, no lesions  Chaperone was present for exam:  ***  Assessment:   Well woman visit with gynecologic exam.   Plan: Mammogram screening discussed. Self breast awareness reviewed. Pap and HR HPV as above. Guidelines for Calcium, Vitamin D, regular exercise program including cardiovascular and weight bearing exercise.   Follow up annually and prn.   Additional counseling given.  {yes [id number]. _______ minutes face to face time of which over 50% was spent in counseling.    After visit summary provided.

## 2021-08-29 ENCOUNTER — Ambulatory Visit (INDEPENDENT_AMBULATORY_CARE_PROVIDER_SITE_OTHER): Payer: Medicare PPO | Admitting: Obstetrics and Gynecology

## 2021-08-29 ENCOUNTER — Encounter: Payer: Self-pay | Admitting: Obstetrics and Gynecology

## 2021-08-29 VITALS — BP 128/70 | HR 63 | Ht 66.0 in | Wt 235.0 lb

## 2021-08-29 DIAGNOSIS — Z01419 Encounter for gynecological examination (general) (routine) without abnormal findings: Secondary | ICD-10-CM | POA: Diagnosis not present

## 2021-08-29 NOTE — Patient Instructions (Addendum)
EXERCISE AND DIET:  We recommended that you start or continue a regular exercise program for good health. Regular exercise means any activity that makes your heart beat faster and makes you sweat.  We recommend exercising at least 30 minutes per day at least 3 days a week, preferably 4 or 5.  We also recommend a diet low in fat and sugar.  Inactivity, poor dietary choices and obesity can cause diabetes, heart attack, stroke, and kidney damage, among others.    ALCOHOL AND SMOKING:  Women should limit their alcohol intake to no more than 7 drinks/beers/glasses of wine (combined, not each!) per week. Moderation of alcohol intake to this level decreases your risk of breast cancer and liver damage. And of course, no recreational drugs are part of a healthy lifestyle.  And absolutely no smoking or even second hand smoke. Most people know smoking can cause heart and lung diseases, but did you know it also contributes to weakening of your bones? Aging of your skin?  Yellowing of your teeth and nails?  CALCIUM AND VITAMIN D:  Adequate intake of calcium and Vitamin D are recommended.  The recommendations for exact amounts of these supplements seem to change often, but generally speaking 600 mg of calcium (either carbonate or citrate) and 800 units of Vitamin D per day seems prudent. Certain women may benefit from higher intake of Vitamin D.  If you are among these women, your doctor will have told you during your visit.    PAP SMEARS:  Pap smears, to check for cervical cancer or precancers,  have traditionally been done yearly, although recent scientific advances have shown that most women can have pap smears less often.  However, every woman still should have a physical exam from her gynecologist every year. It will include a breast check, inspection of the vulva and vagina to check for abnormal growths or skin changes, a visual exam of the cervix, and then an exam to evaluate the size and shape of the uterus and  ovaries.  And after 71 years of age, a rectal exam is indicated to check for rectal cancers. We will also provide age appropriate advice regarding health maintenance, like when you should have certain vaccines, screening for sexually transmitted diseases, bone density testing, colonoscopy, mammograms, etc.   MAMMOGRAMS:  All women over 40 years old should have a yearly mammogram. Many facilities now offer a "3D" mammogram, which may cost around $50 extra out of pocket. If possible,  we recommend you accept the option to have the 3D mammogram performed.  It both reduces the number of women who will be called back for extra views which then turn out to be normal, and it is better than the routine mammogram at detecting truly abnormal areas.    COLONOSCOPY:  Colonoscopy to screen for colon cancer is recommended for all women at age 50.  We know, you hate the idea of the prep.  We agree, BUT, having colon cancer and not knowing it is worse!!  Colon cancer so often starts as a polyp that can be seen and removed at colonscopy, which can quite literally save your life!  And if your first colonoscopy is normal and you have no family history of colon cancer, most women don't have to have it again for 10 years.  Once every ten years, you can do something that may end up saving your life, right?  We will be happy to help you get it scheduled when you are ready.    Be sure to check your insurance coverage so you understand how much it will cost.  It may be covered as a preventative service at no cost, but you should check your particular policy.    Calcium Content in Foods Calcium is the most abundant mineral in the body. Most of the body's calcium supply is stored in bones and teeth. Calcium helps many parts of the body function normally, including: Blood and blood vessels. Nerves. Hormones. Muscles. Bones and teeth. When your calcium stores are low, you may be at risk for low bone mass, bone loss, and broken bones  (fractures). When you get enough calcium, it helps to support strong bones and teeth throughout your life. Calcium is especially important for: Children during growth spurts. Girls during adolescence. Women who are pregnant or breastfeeding. Women after their menstrual cycle stops (postmenopause). Women whose menstrual cycle has stopped due to anorexia nervosa or regular intense exercise. People who cannot eat or digest dairy products. Vegans. Recommended daily amounts of calcium: Women (ages 19 to 50): 1,000 mg per day. Women (ages 51 and older): 1,200 mg per day. Men (ages 19 to 70): 1,000 mg per day. Men (ages 71 and older): 1,200 mg per day. Women (ages 9 to 18): 1,300 mg per day. Men (ages 9 to 18): 1,300 mg per day. General information Eat foods that are high in calcium. Try to get most of your calcium from food. Some people may benefit from taking calcium supplements. Check with your health care provider or diet and nutrition specialist (dietitian) before starting any calcium supplements. Calcium supplements may interact with certain medicines. Too much calcium may cause other health problems, such as constipation and kidney stones. For the body to absorb calcium, it needs vitamin D. Sources of vitamin D include: Skin exposure to direct sunlight. Foods, such as egg yolks, liver, mushrooms, saltwater fish, and fortified milk. Vitamin D supplements. Check with your health care provider or dietitian before starting any vitamin D supplements. What foods are high in calcium?  Foods that are high in calcium contain more than 100 milligrams per serving. Fruits Fortified orange juice or other fruit juice, 300 mg per 8 oz serving. Vegetables Collard greens, 360 mg per 8 oz serving. Kale, 100 mg per 8 oz serving. Bok choy, 160 mg per 8 oz serving. Grains Fortified ready-to-eat cereals, 100 to 1,000 mg per 8 oz serving. Fortified frozen waffles, 200 mg in 2 waffles. Oatmeal, 140 mg in  1 cup. Meats and other proteins Sardines, canned with bones, 325 mg per 3 oz serving. Salmon, canned with bones, 180 mg per 3 oz serving. Canned shrimp, 125 mg per 3 oz serving. Baked beans, 160 mg per 4 oz serving. Tofu, firm, made with calcium sulfate, 253 mg per 4 oz serving. Dairy Yogurt, plain, low-fat, 310 mg per 6 oz serving. Nonfat milk, 300 mg per 8 oz serving. American cheese, 195 mg per 1 oz serving. Cheddar cheese, 205 mg per 1 oz serving. Cottage cheese 2%, 105 mg per 4 oz serving. Fortified soy, rice, or almond milk, 300 mg per 8 oz serving. Mozzarella, part skim, 210 mg per 1 oz serving. The items listed above may not be a complete list of foods high in calcium. Actual amounts of calcium may be different depending on processing. Contact a dietitian for more information. What foods are lower in calcium? Foods that are lower in calcium contain 50 mg or less per serving. Fruits Apple, about 6 mg. Banana, about 12 mg.   Vegetables Lettuce, 19 mg per 2 oz serving. Tomato, about 11 mg. Grains Rice, 4 mg per 6 oz serving. Boiled potatoes, 14 mg per 8 oz serving. White bread, 6 mg per slice. Meats and other proteins Egg, 27 mg per 2 oz serving. Red meat, 7 mg per 4 oz serving. Chicken, 17 mg per 4 oz serving. Fish, cod, or trout, 20 mg per 4 oz serving. Dairy Cream cheese, regular, 14 mg per 1 Tbsp serving. Brie cheese, 50 mg per 1 oz serving. Parmesan cheese, 70 mg per 1 Tbsp serving. The items listed above may not be a complete list of foods lower in calcium. Actual amounts of calcium may be different depending on processing. Contact a dietitian for more information. Summary Calcium is an important mineral in the body because it affects many functions. Getting enough calcium helps support strong bones and teeth throughout your life. Try to get most of your calcium from food. Calcium supplements may interact with certain medicines. Check with your health care provider  or dietitian before starting any calcium supplements. This information is not intended to replace advice given to you by your health care provider. Make sure you discuss any questions you have with your health care provider. Document Revised: 06/30/2019 Document Reviewed: 06/30/2019 Elsevier Patient Education  2023 Elsevier Inc.  

## 2021-10-23 ENCOUNTER — Encounter: Payer: Self-pay | Admitting: Obstetrics and Gynecology

## 2022-01-18 ENCOUNTER — Telehealth: Payer: Self-pay

## 2022-01-18 NOTE — Telephone Encounter (Signed)
   Pre-operative Risk Assessment    Patient Name: Donna Moon  DOB: 05-25-1950 MRN: [id number]      Request for Surgical Clearance    Procedure:   RT TOTAL KNEE ARTHROPLASTY  Date of Surgery:  Clearance 04/22/22                                 Surgeon:  DR Gaynelle Arabian Surgeon's Group or Practice Name:  [facility] Phone number:  [phone] Fax number:  [phone]   Type of Clearance Requested:   - Medical NEED ASA INSTRUCTIONS   Type of Anesthesia:   CHOICE   Additional requests/questions:  Please fax a copy of CARDIAC CLEARANCE to the surgeon's office.  Signed, Donna Moon  CCMA 01/18/2022, 4:55 PM

## 2022-01-21 NOTE — Telephone Encounter (Signed)
Spoke with the patient and advised her that since she already has na upcoming appointment with Dr Sallyanne Kuster in December; he can discuss clearance at that time. Patient agreeable and voiced understanding.

## 2022-01-21 NOTE — Telephone Encounter (Signed)
    Primary Cardiologist:Mihai Croitoru, MD  Chart reviewed as part of pre-operative protocol coverage. Because of Donna Moon's past medical history and time since last visit, he/she will require a follow-up visit in order to better assess preoperative cardiovascular risk.  Pre-op covering staff: - Please schedule appointment and call patient to inform them. - Please contact requesting surgeon's office via preferred method (i.e, phone, fax) to inform them of need for appointment prior to surgery.  If applicable, this message will also be routed to pharmacy pool and/or primary cardiologist for input on holding anticoagulant/antiplatelet agent as requested below so that this information is available at time of patient's appointment.   [name], NP  01/21/2022, 11:19 AM

## 2022-03-05 ENCOUNTER — Encounter: Payer: Self-pay | Admitting: Cardiovascular Disease

## 2022-03-05 ENCOUNTER — Ambulatory Visit: Payer: Medicare PPO | Attending: Cardiovascular Disease | Admitting: Cardiovascular Disease

## 2022-03-05 VITALS — BP 138/80 | HR 62 | Ht 68.0 in | Wt 238.8 lb

## 2022-03-05 DIAGNOSIS — I4719 Other supraventricular tachycardia: Secondary | ICD-10-CM

## 2022-03-05 DIAGNOSIS — R011 Cardiac murmur, unspecified: Secondary | ICD-10-CM

## 2022-03-05 DIAGNOSIS — Z79899 Other long term (current) drug therapy: Secondary | ICD-10-CM

## 2022-03-05 DIAGNOSIS — E7800 Pure hypercholesterolemia, unspecified: Secondary | ICD-10-CM

## 2022-03-05 DIAGNOSIS — Z6835 Body mass index (BMI) 35.0-35.9, adult: Secondary | ICD-10-CM

## 2022-03-05 NOTE — Progress Notes (Signed)
Patient ID: Donna Moon, female   DOB: Apr 22, 1950, 71 y.o.   MRN: [id number]     Cardiology Office Note    Date:  03/05/2022   12 months or:  Donna, Moon 03/10/1951, MRN [id number]  PCP:  Coolidge Breeze, FNP  Cardiologist:   Sanda Klein, MD   Chief Complaint  Patient presents with   Irregular Heart Beat    History of Present Illness:  Donna Moon is a 71 y.o. female with a history of paroxysmal atrial fibrillation with 2 previous successful ablation procedures and some residual palpitations presumed to be secondary to recurrent paroxysmal atrial arrhythmia. Symptoms are very well controlled on metoprolol and propafenone.  In 2018 a false positive ECG stress stress led to coronary angiography, with normal results.  Since her ablation, we really have not documented atrial flutter or atrial fibrillation, her symptoms seem to be related to frequent isolated PACs and very brief bursts of paroxysmal atrial tachycardia.  She continues to have palpitations occurring at least monthly, sometimes 2 or 3 times a month, but these are generally brief lasting for just a few minutes to a maximum of 20-30 minutes.  They make her feel briefly unwell and slightly short of breath, but she does not have dizziness or syncope or chest pain.  Symptoms are generally well-tolerated if she can just sit down and relax for a few minutes.  She had COVID-19 infection around Thanksgiving and had high fevers for several days.  She has recovered without sequelae.  The patient specifically denies any chest pain at rest or with exertion, dyspnea at rest or with exertion, orthopnea, paroxysmal nocturnal dyspnea, syncope, palpitations, focal neurological deficits, intermittent claudication, lower extremity edema, unexplained weight gain, cough, hemoptysis or wheezing.   She continues to go to water aerobics and water Zumba several times a week.  She is scheduled to have another orthopedic surgery (right knee replacement)  in February with Dr. Wynelle Link.  ECG today shows normal sinus rhythm with a narrow QRS, sharp Q waves in lead I and aVL, normal QTc 442 ms, no ischemic repolarization abnormalities.  She does not have a history of stroke/TIA and has low embolic risk profile (CHADSvasc 1-2 for age 89 and female gender only).   Past Medical History:  Diagnosis Date   Allergy    Arthritis    Asthma    Atrial fibrillation ([place])    Blood transfusion without reported diagnosis    as a child--age 82   Cancer ([place])    Mohs surgery for skin cancer on right side of nose    COVID-19 01/2019   Dizziness    Dyspareunia    Dysrhythmia    atrial fib   Elevated serum creatinine    Heart murmur    Hematuria    hx of has been worked up and no problems    Hx of adenomatous polyp of colon 03/06/2018   Hx of seasonal allergies    tx. Allegra, Flonase   Obesity    Osteoporosis    Pneumonia    hx of    PONV (postoperative nausea and vomiting)    however no problems with past surgeries here.    Past Surgical History:  Procedure Laterality Date   [place] STUDY AND ABLATION  1997 & 2011   x 2   Carpel Tunnel Bilateral 1993   CERVICAL [place]E BIOPSY     laparoscopic BSO--endometrioma   [place]  COLONOSCOPY  2009   Dr. Algis Greenhouse [place]   LEFT HEART CATH AND CORONARY ANGIOGRAPHY N/A 07/29/2016   Procedure: Left Heart Cath and Coronary Angiography;  Surgeon: Martinique, Peter M, MD;  Location: [facility];  Service: Cardiovascular;  Laterality: N/A;   OOPHORECTOMY  2009   REVERSE SHOULDER ARTHROPLASTY Left 02/29/2016   Procedure: REVERSE SHOULDER ARTHROPLASTY;  Surgeon: Justice Britain, MD;  Location: [facility];  Service: Orthopedics;  Laterality: Left;   REVERSE SHOULDER ARTHROPLASTY Right 06/26/2017   Procedure: RIGHT REVERSE SHOULDER ARTHROPLASTY;  Surgeon: Justice Britain, MD;  Location: [facility];  Service: Orthopedics;  Laterality: Right;   ROTATOR CUFF REPAIR Right 1999    x2 -'97, 99   SUPERFICIAL PERONEAL NERVE RELEASE Right    Right elbow nerve release   TOTAL HIP ARTHROPLASTY  12/27/2010   x2 replacements, x1 repair.   TOTAL HIP ARTHROPLASTY Right 03/08/2015   Procedure: TOTAL RIGHT  HIP ARTHROPLASTY ANTERIOR APPROACH;  Surgeon: Gaynelle Arabian, MD;  Location: [facility];  Service: Orthopedics;  Laterality: Right;   TOTAL KNEE ARTHROPLASTY Left 12/07/2012   Procedure: LEFT TOTAL KNEE ARTHROPLASTY;  Surgeon: Gearlean Alf, MD;  Location: [facility];  Service: Orthopedics;  Laterality: Left;   UPPER GASTROINTESTINAL ENDOSCOPY  2009   [place], NL    Outpatient Medications Prior to Visit  Medication Sig Dispense Refill   aspirin EC 81 MG tablet Take 81 mg by mouth daily.      Calcium Carb-Cholecalciferol (CALCIUM 600+D3 PO) Take 1 tablet by mouth daily.     ibuprofen (ADVIL,MOTRIN) 800 MG tablet Take 800 mg by mouth daily as needed (for pain).      metoprolol tartrate (LOPRESSOR) 25 MG tablet TAKE 1 TABLET BY MOUTH IN THE MORNING AND 2 IN THE EVENING 270 tablet 3   Probiotic Product (PROBIOTIC PO) Take 1 tablet by mouth daily.     propafenone (RYTHMOL) 150 MG tablet TAKE (1) TABLET BY MOUTH EVERY EIGHT HOURS. 270 tablet 3   acetaminophen (TYLENOL) 500 MG tablet Take 1,000 mg by mouth daily as needed for moderate pain or headache.  (Patient not taking: Reported on 03/05/2022)     albuterol (PROVENTIL) (2.5 MG/3ML) 0.083% nebulizer solution Take 2.5 mg by nebulization every 6 (six) hours as needed for wheezing or shortness of breath. (Patient not taking: Reported on 03/05/2022)     No facility-administered medications prior to visit.     Allergies:   Lexapro [escitalopram oxalate] and Rivaroxaban   Social History   Socioeconomic History   Marital status: Married    Spouse name: Not on file   Number of children: 1   Years of education: Not on file   Highest education level: Not on file  Occupational History   Occupation: retired  Tobacco Use   Smoking  status: Former    Packs/day: 0.50    Years: 9.00    Total pack years: 4.50    Types: Cigarettes    Quit date: 03/18/1986    Years since quitting: 35.9   Smokeless tobacco: Never  Vaping Use   Vaping Use: Never used  Substance and Sexual Activity   Alcohol use: No    Alcohol/week: 0.0 standard drinks of alcohol   Drug use: No   Sexual activity: Yes    Partners: Male    Birth control/protection: Post-menopausal    Comment: first intercourse >16, less than 5 partners  Other Topics Concern   Not on file  Social History Narrative   Married, retired.  One child.   Former smoker no alcohol tobacco or drug use currently   Social Determinants of Radio broadcast assistant Strain: Not on file  Food Insecurity: Not on file  Transportation Needs: Not on file  Physical Activity: Not on file  Stress: Not on file  Social Connections: Not on file     Family History:  The patient's family history includes Atrial fibrillation in her mother; Cancer in her sister; Cancer (age of onset: 69) in her brother; Colon polyps in her brother and brother; Diabetes in her maternal grandmother, paternal grandmother, sister, and sister; Heart attack in her brother and father; Irritable bowel syndrome in her sister; Kidney disease in her mother; Lupus in her sister; Lymphoma in her father; Ovarian cancer (age of onset: 55) in her mother; Stroke in her mother; Ulcerative colitis in her mother.   ROS:   Please see the history of present illness.    ROS All other systems are reviewed and are negative.   PHYSICAL EXAM:   VS:  BP 138/80 (BP Location: Left Arm, Patient Position: Sitting, Cuff Size: Large)   Pulse 62   Ht '5\' 8"'$  (1.727 m)   Wt 238 lb 12.8 oz (108.3 kg)   LMP 03/18/2001   SpO2 95%   BMI 36.31 kg/m      General: Alert, oriented x3, no distress, severely obese Head: no evidence of trauma, PERRL, EOMI, no exophtalmos or lid lag, no myxedema, no xanthelasma; normal ears, nose and  oropharynx Neck: normal jugular venous pulsations and no hepatojugular reflux; brisk carotid pulses without delay and no carotid bruits Chest: clear to auscultation, no signs of consolidation by percussion or palpation, normal fremitus, symmetrical and full respiratory excursions Cardiovascular: normal position and quality of the apical impulse, regular rhythm, normal first and second heart sounds, early-mid peaking 2/6 aortic ejection, no diastolic murmurs, rubs or gallops Abdomen: no tenderness or distention, no masses by palpation, no abnormal pulsatility or arterial bruits, normal bowel sounds, no hepatosplenomegaly Extremities: no clubbing, cyanosis or edema; 2+ radial, ulnar and brachial pulses bilaterally; 2+ right femoral, posterior tibial and dorsalis pedis pulses; 2+ left femoral, posterior tibial and dorsalis pedis pulses; no subclavian or femoral bruits Neurological: grossly nonfocal Psych: Normal mood and affect   Wt Readings from Last 3 Encounters:  03/05/22 238 lb 12.8 oz (108.3 kg)  08/29/21 235 lb (106.6 kg)  12/06/20 247 lb 9.6 oz (112.3 kg)    Studies/Labs Reviewed:   EKG:  EKG is ordered today.  It shows normal sinus rhythm with borderline PR interval, sharp Q waves in leads I and aVL and rSr' pattern in V1-V2, but with a normal QRS duration of 98 ms there are no ischemic repolarization abnormalities.  QTc 442 ms Recent Labs: No results found for requested labs within last 365 days.  10/26/2019 creatinine 1.25, potassium 4.2  Lipid Panel    Component Value Date/Time   CHOL 192 05/10/2019 0912   TRIG 132 05/10/2019 0912   HDL 57 05/10/2019 0912   CHOLHDL 3.4 05/10/2019 0912   CHOLHDL 3.1 07/25/2016 0909   VLDL 18 07/25/2016 0909   LDLCALC 112 (H) 05/10/2019 0912    ASSESSMENT:    1. Murmur   2. PAT (paroxysmal atrial tachycardia)   3. Severe obesity (BMI 35.0-35.9 with comorbidity) ([place])   4. Long term current use of antiarrhythmic drug   5.  Hypercholesterolemia      PLAN:  In order of problems listed above:   PAT/PAF: Even  though she has occasional breakthrough palpitations, she is generally satisfied with the current rhythm control. CHA2DS2-VASc 2 (age, gender).  She is not on anticoagulants since we have not documented either atrial flutter or atrial fibrillation in many many years.  I encouraged her to get some type of electronic monitoring device such as a smart watch or [place] mobile.  She can send recordings of symptomatic events through MyChart.  Her symptoms recently seem to be due to isolated PACs and/or paroxysmal atrial tachycardia.  Okay to take additional doses of metoprolol on top of the daily prescription for breakthrough palpitations, but she cannot tolerate higher doses of medication on a daily basis due to sinus bradycardia.  Will get basic metabolic panel results from her PCP. Obesity: Weight loss would likely lead to reduction in the frequency of arrhythmic events. Propafenone: No evidence of proarrhythmia.  Normal coronary angiography in 2018.  No angina pectoris. HLP: No known CAD or CAD.  LDL cholesterol slightly higher than desirable, but pharmacological therapy has not been recommended.   Murmur: Sounds like possible aortic stenosis.  Check echocardiogram.      Medication Adjustments/Labs and Tests Ordered: Current medicines are reviewed at length with the patient today.  Concerns regarding medicines are outlined above.  Medication changes, Labs and Tests ordered today are listed in the Patient Instructions below. Patient Instructions  Medication Instructions:  The current medical regimen is effective;  continue present plan and medications.  *If you need a refill on your cardiac medications before your next appointment, please call your pharmacy*   Testing/Procedures: Echocardiogram - Your physician has requested that you have an echocardiogram. Echocardiography is a painless test that uses sound  waves to create images of your heart. It provides your doctor with information about the size and shape of your heart and how well your heart's chambers and valves are working. This procedure takes approximately one hour. There are no restrictions for this procedure.     Follow-Up: At [facility], you and your health needs are our priority.  As part of our continuing mission to provide you with exceptional heart care, we have created designated Provider Care Teams.  These Care Teams include your primary Cardiologist (physician) and Advanced Practice Providers (APPs -  Physician Assistants and Nurse Practitioners) who all work together to provide you with the care you need, when you need it.  We recommend signing up for the patient portal called "MyChart".  Sign up information is provided on this After Visit Summary.  MyChart is used to connect with patients for Virtual Visits (Telemedicine).  Patients are able to view lab/test results, encounter notes, upcoming appointments, etc.  Non-urgent messages can be sent to your provider as well.   To learn more about what you can do with MyChart, go to [email or web address].    Your next appointment:   12 month(s)  The format for your next appointment:   In Person  Provider:   Sanda Klein, MD               Signed, Sanda Klein, MD  03/05/2022 10:09 AM    [address], [place], [place]  [address] Phone: [phone]; Fax: [phone]

## 2022-03-05 NOTE — Patient Instructions (Signed)
Medication Instructions:  The current medical regimen is effective;  continue present plan and medications.  *If you need a refill on your cardiac medications before your next appointment, please call your pharmacy*   Testing/Procedures: Echocardiogram - Your physician has requested that you have an echocardiogram. Echocardiography is a painless test that uses sound waves to create images of your heart. It provides your doctor with information about the size and shape of your heart and how well your heart's chambers and valves are working. This procedure takes approximately one hour. There are no restrictions for this procedure.     Follow-Up: At [facility], you and your health needs are our priority.  As part of our continuing mission to provide you with exceptional heart care, we have created designated Provider Care Teams.  These Care Teams include your primary Cardiologist (physician) and Advanced Practice Providers (APPs -  Physician Assistants and Nurse Practitioners) who all work together to provide you with the care you need, when you need it.  We recommend signing up for the patient portal called "MyChart".  Sign up information is provided on this After Visit Summary.  MyChart is used to connect with patients for Virtual Visits (Telemedicine).  Patients are able to view lab/test results, encounter notes, upcoming appointments, etc.  Non-urgent messages can be sent to your provider as well.   To learn more about what you can do with MyChart, go to [email or web address].    Your next appointment:   12 month(s)  The format for your next appointment:   In Person  Provider:   Sanda Klein, MD

## 2022-03-06 ENCOUNTER — Encounter: Payer: Self-pay | Admitting: Cardiovascular Disease

## 2022-04-02 NOTE — H&P (Addendum)
TOTAL KNEE ADMISSION H&P  Patient is being admitted for right total knee arthroplasty.  Subjective:  Chief Complaint: Right knee pain.  HPI: Donna Moon, 72 y.o. female has a history of pain and functional disability in the right knee due to arthritis and has failed non-surgical conservative treatments for greater than 12 weeks to include NSAID's and/or analgesics, corticosteriod injections, weight reduction as appropriate, and activity modification. Onset of symptoms was gradual, starting 5 years ago with gradually worsening course since that time. The patient noted no past surgery on the right knee.  Patient currently rates pain in the right knee at 5 out of 10 with activity. Patient has worsening of pain with activity and weight bearing, pain that interferes with activities of daily living, and pain with passive range of motion. Patient has evidence of subchondral cysts, subchondral sclerosis, periarticular osteophytes, and joint space narrowing by imaging studies. There is no active infection.  Patient Active Problem List   Diagnosis Date Noted   Long term current use of antiarrhythmic drug 04/30/2018   Severe obesity (BMI 35.0-35.9 with comorbidity) ([place]) 04/30/2018   PAT (paroxysmal atrial tachycardia) 04/30/2018   Hx of adenomatous polyp of colon 03/06/2018   S/p reverse total shoulder arthroplasty 06/26/2017   Abnormal stress test 07/29/2016   Dyspnea on exertion 07/29/2016   S/P reverse total shoulder arthroplasty, left 02/29/2016   OA (osteoarthritis) of hip 03/08/2015   Atrophic vaginitis 06/10/2013   Unspecified asthma(493.90) 12/22/2012   OA (osteoarthritis) of knee 12/07/2012   Paroxysmal atrial fibrillation ([place]) 09/30/2012   Obesity (BMI 30-39.9) 09/30/2012   Orthostatic hypotension 09/30/2012    Past Medical History:  Diagnosis Date   Allergy    Arthritis    Asthma    Atrial fibrillation ([place])    Blood transfusion without reported diagnosis    as a child--age 39    Cancer ([place])    Mohs surgery for skin cancer on right side of nose    COVID-19 01/2019   Dizziness    Dyspareunia    Dysrhythmia    atrial fib   Elevated serum creatinine    Heart murmur    Hematuria    hx of has been worked up and no problems    Hx of adenomatous polyp of colon 03/06/2018   Hx of seasonal allergies    tx. Allegra, Flonase   Obesity    Osteoporosis    Pneumonia    hx of    PONV (postoperative nausea and vomiting)    however no problems with past surgeries here.    Past Surgical History:  Procedure Laterality Date   APPENDECTOMY  1968   CARDIAC ELECTROPHYSIOLOGY STUDY AND ABLATION  1997 & 2011   x 2   Carpel Tunnel Bilateral 1993   CERVICAL CONE BIOPSY     laparoscopic BSO--endometrioma   CESAREAN SECTION  1986   COLONOSCOPY  2009   Dr. Algis Greenhouse [place]   LEFT HEART CATH AND CORONARY ANGIOGRAPHY N/A 07/29/2016   Procedure: Left Heart Cath and Coronary Angiography;  Surgeon: Martinique, Peter M, MD;  Location: [facility];  Service: Cardiovascular;  Laterality: N/A;   OOPHORECTOMY  2009   REVERSE SHOULDER ARTHROPLASTY Left 02/29/2016   Procedure: REVERSE SHOULDER ARTHROPLASTY;  Surgeon: Justice Britain, MD;  Location: [facility];  Service: Orthopedics;  Laterality: Left;   REVERSE SHOULDER ARTHROPLASTY Right 06/26/2017   Procedure: RIGHT REVERSE SHOULDER ARTHROPLASTY;  Surgeon: Justice Britain, MD;  Location: [facility];  Service: Orthopedics;  Laterality: Right;  ROTATOR CUFF REPAIR Right 1999   x2 -'97, 99   SUPERFICIAL PERONEAL NERVE RELEASE Right    Right elbow nerve release   TOTAL HIP ARTHROPLASTY  12/27/2010   x2 replacements, x1 repair.   TOTAL HIP ARTHROPLASTY Right 03/08/2015   Procedure: TOTAL RIGHT  HIP ARTHROPLASTY ANTERIOR APPROACH;  Surgeon: Gaynelle Arabian, MD;  Location: [facility];  Service: Orthopedics;  Laterality: Right;   TOTAL KNEE ARTHROPLASTY Left 12/07/2012   Procedure: LEFT TOTAL KNEE ARTHROPLASTY;  Surgeon: Gearlean Alf, MD;  Location: [facility];   Service: Orthopedics;  Laterality: Left;   UPPER GASTROINTESTINAL ENDOSCOPY  2009   Shiflett, NL    Prior to Admission medications   Medication Sig Start Date End Date Taking? Authorizing Provider  acetaminophen (TYLENOL) 500 MG tablet Take 1,000 mg by mouth daily as needed for moderate pain or headache.  Patient not taking: Reported on 03/05/2022    [provider]  albuterol (PROVENTIL) (2.5 MG/3ML) 0.083% nebulizer solution Take 2.5 mg by nebulization every 6 (six) hours as needed for wheezing or shortness of breath. Patient not taking: Reported on 03/05/2022    [provider]  aspirin EC 81 MG tablet Take 81 mg by mouth daily.     [provider]  Calcium Carb-Cholecalciferol (CALCIUM 600+D3 PO) Take 1 tablet by mouth daily.    [provider]  ibuprofen (ADVIL,MOTRIN) 800 MG tablet Take 800 mg by mouth daily as needed (for pain).     [provider]  metoprolol tartrate (LOPRESSOR) 25 MG tablet TAKE 1 TABLET BY MOUTH IN THE MORNING AND 2 IN THE EVENING 06/18/21   Croitoru, Mihai, MD  Probiotic Product (PROBIOTIC PO) Take 1 tablet by mouth daily.    [provider]  propafenone (RYTHMOL) 150 MG tablet TAKE (1) TABLET BY MOUTH EVERY EIGHT HOURS. 06/18/21   Croitoru, Dani Gobble, MD    Allergies  Allergen Reactions   Lexapro [Escitalopram Oxalate] Other (See Comments)    Flu like symptoms    Rivaroxaban Itching and Other (See Comments)    Social History   Socioeconomic History   Marital status: Married    Spouse name: Not on file   Number of children: 1   Years of education: Not on file   Highest education level: Not on file  Occupational History   Occupation: retired  Tobacco Use   Smoking status: Former    Packs/day: 0.50    Years: 9.00    Total pack years: 4.50    Types: Cigarettes    Quit date: 03/18/1986    Years since quitting: 36.0   Smokeless tobacco: Never  Vaping Use   Vaping Use: Never used  Substance and Sexual  Activity   Alcohol use: No    Alcohol/week: 0.0 standard drinks of alcohol   Drug use: No   Sexual activity: Yes    Partners: Male    Birth control/protection: Post-menopausal    Comment: first intercourse >16, less than 5 partners  Other Topics Concern   Not on file  Social History Narrative   Married, retired.  One child.   Former smoker no alcohol tobacco or drug use currently   Social Determinants of Radio broadcast assistant Strain: Not on file  Food Insecurity: Not on file  Transportation Needs: Not on file  Physical Activity: Not on file  Stress: Not on file  Social Connections: Not on file  Intimate Partner Violence: Not on file    Tobacco Use: Medium Risk (03/05/2022)  Patient History    Smoking Tobacco Use: Former    Smokeless Tobacco Use: Never    Passive Exposure: Not on file   Social History   Substance and Sexual Activity  Alcohol Use No   Alcohol/week: 0.0 standard drinks of alcohol    Family History  Problem Relation Age of Onset   Stroke Mother    Ovarian cancer Mother 54       dec uterine or ovarian ca   Ulcerative colitis Mother    Kidney disease Mother    Atrial fibrillation Mother    Heart attack Father    Lymphoma Father    Heart attack Brother    Colon polyps Brother    Diabetes Sister        AODM   Diabetes Sister        AODM   Irritable bowel syndrome Sister    Cancer Sister        kidney cancer   Diabetes Maternal Grandmother    Diabetes Paternal Grandmother    Lupus Sister        dec age 29 complications from Lupus   Cancer Brother 69       melanoma   Colon polyps Brother    Esophageal cancer Neg Hx    Rectal cancer Neg Hx    Stomach cancer Neg Hx    Colon cancer Neg Hx     ROS  Objective:  Physical Exam: The patient is a pleasant, well-developed female alert and oriented in no apparent distress.    The patient has an antalgic gait pattern favoring the right side without the use of assistive devices.    Right  Knee Exam:  No effusion present. No swelling present. Varus deformity.  The range of motion is: 0 to 125 degrees.  No crepitus on range of motion of the knee.  Positive medial joint line tenderness.  No lateral joint line tenderness.  The knee is stable.    The patient's sensation and motor function are intact in their lower extremities. Their distal pulses are 2+. The bilateral calves are soft and non-tender.   Imaging Review - AP and lateral of the bilateral knees dated 08/2021 demonstrate bone-on-bone arthritis in the medial compartment of the right knee, as well as the patellofemoral compartment. She has a well-fixed prosthesis on the left.  Assessment/Plan:  End stage arthritis, right knee   The patient history, physical examination, clinical judgment of the provider and imaging studies are consistent with end stage degenerative joint disease of the right knee and total knee arthroplasty is deemed medically necessary. The treatment options including medical management, injection therapy arthroscopy and arthroplasty were discussed at length. The risks and benefits of total knee arthroplasty were presented and reviewed. The risks due to aseptic loosening, infection, stiffness, patella tracking problems, thromboembolic complications and other imponderables were discussed. The patient acknowledged the explanation, agreed to proceed with the plan and consent was signed. Patient is being admitted for inpatient treatment for surgery, pain control, PT, OT, prophylactic antibiotics, VTE prophylaxis, progressive ambulation and ADLs and discharge planning. The patient is planning to be discharged home with husband and OPPT.   Patient's anticipated LOS is less than 2 midnights, meeting these requirements: - Younger than 36 - Lives within 1 hour of care - Has a competent adult at home to recover with post-op recover - NO history of  - Chronic pain requiring opiods  - Diabetes  - Coronary Artery  Disease  - Heart failure  -  Heart attack  - Stroke  - DVT/VTE  - Respiratory Failure/COPD  - Renal failure  - Anemia  - Advanced Liver disease   Therapy Plans: [facility] in [place] Disposition: Home with husband Planned DVT Prophylaxis: Eliquis 2.'5mg'$  DME Needed: None  PCP: Dr Yong Channel - clearance received Cardiologist: Sanda Klein, MD - clearance received TXA: IV Allergies: lexapro, xarelto (itching) Anesthesia Concerns: Woke up during shoulder surgery BMI: 35.6 Last HgbA1c: Not diabetic  Pharmacy: [facility]  Other: Will bring clearance form to PCP appt on 1/23 Hx a fib, rate controlled  - Patient was instructed on what medications to stop prior to surgery. - Follow-up visit in 2 weeks with Dr. Wynelle Link - Begin physical therapy following surgery - Pre-operative lab work as pre-surgical testing - Prescriptions will be provided in hospital at time of discharge  Shearon Balo, PA-C Orthopedic Surgery [facility] [place]

## 2022-04-04 ENCOUNTER — Ambulatory Visit (HOSPITAL_COMMUNITY): Payer: Medicare PPO | Attending: Cardiovascular Disease

## 2022-04-04 DIAGNOSIS — R011 Cardiac murmur, unspecified: Secondary | ICD-10-CM

## 2022-04-04 LAB — ECHOCARDIOGRAM COMPLETE
Area-P 1/2: 3.65 cm2
S' Lateral: 2.5 cm

## 2022-04-11 NOTE — Progress Notes (Addendum)
COVID Vaccine received:  []  No [x]  Yes Date of any COVID positive Test in last 90 days:  PCP - Suzzanne Cloud, FNP   Dr Abran Richard at [facility]. [phone] Clearance on chart Cardiologist - Sanda Klein, MD   Cardiac clearance 03-05-22 Epic note  Chest x-ray -  EKG - 03-05-2022  epic  Stress Test - 05-15-2016  epic ECHO - 04-04-2022   epic Cardiac Cath - LHC- no stent 07-29-2016 by Dr. Martinique    epic  PCR screen: [x]  Ordered & Completed                      []   No Order but Needs PROFEND                      []   N/A for this surgery  Surgery Plan:  []  Ambulatory                            [x]  Outpatient in bed                            []  Admit  Anesthesia:    []  General  []  Spinal                           [x]   Choice []   MAC  Pacemaker / ICD device [x]  No []  Yes        Device order form faxed [x]  No    []   Yes      Faxed to:  Spinal Cord Stimulator:[x]  No []  Yes      (Remind patient to bring remote DOS) Other Implants:   History of Sleep Apnea? [x]  No []  Yes   CPAP used?- [x]  No []  Yes    Does the patient monitor blood sugar? []  No []  Yes  []  N/A Diet only, no meds Patient has: [x]  Pre-DM   []  DM1  []   DM2 Does patient have a [employer] or Dexacom? [x]  No []  Yes   Checks Blood Sugar _0_ times a day  Blood Thinner / Instructions:  none Aspirin Instructions:   ASA 81 mg  Hold 5 days prior to surgery per patient.   ERAS Protocol Ordered: []  No  [x]  Yes PRE-SURGERY []  ENSURE  [x]  G2  Patient is to be NPO after: 10:45 am  Comments: Patient lives in [place], [place], [place].   Activity level: Patient can climb a flight of stairs without difficulty; [x]  No CP  but would have _SOB and leg pain  Patient can perform ADLs without assistance.   Anesthesia review: PAF- ablations X2 (1997 & 2011), recent PALPs , murmur,  Pre-DM, PONV, DOE, Asthma,   Patient denies shortness of breath, fever, cough and chest pain at PAT  appointment.  Patient verbalized understanding and agreement to the Pre-Surgical Instructions that were given to them at this PAT appointment. Patient was also educated of the need to review these PAT instructions again prior to his/her surgery.I reviewed the appropriate phone numbers to call if they have any and questions or concerns.

## 2022-04-11 NOTE — Patient Instructions (Addendum)
SURGICAL WAITING ROOM VISITATION Patients having surgery or a procedure may have no more than 2 support people in the waiting area - these visitors may rotate in the visitor waiting room.   Due to an increase in RSV and influenza rates and associated hospitalizations, children ages 11 and under may not visit patients in [place]. If the patient needs to stay at the hospital during part of their recovery, the visitor guidelines for inpatient rooms apply.  PRE-OP VISITATION  Pre-op nurse will coordinate an appropriate time for 1 support person to accompany the patient in pre-op.  This support person may not rotate.  This visitor will be contacted when the time is appropriate for the visitor to come back in the pre-op area.  Please refer to the [facility] website for the visitor guidelines for Inpatients (after your surgery is over and you are in a regular room).  You are not required to quarantine at this time prior to your surgery. However, you must do this: Hand Hygiene often Do NOT share personal items Notify your provider if you are in close contact with someone who has COVID or you develop fever 100.4 or greater, new onset of sneezing, cough, sore throat, shortness of breath or body aches.  If you test positive for Covid or have been in contact with anyone that has tested positive in the last 10 days please notify you surgeon.    Your procedure is scheduled on:  Monday  April 22, 2022  Report to [facility] Main Entrance: [place] entrance where the [employer] is available.   Report to admitting at: 11:15  AM  +++++Call this number if you have any questions or problems the morning of surgery [phone]  Do not eat food after Midnight the night prior to your surgery/procedure.  After Midnight you may have the following liquids until   10:45 AM  DAY OF SURGERY  Clear Liquid Diet Water Black Coffee (sugar ok, NO MILK/CREAM OR CREAMERS)  Tea (sugar ok, NO  MILK/CREAM OR CREAMERS) regular and decaf                             Plain Jell-O  with no fruit (NO RED)                                           Fruit ices (not with fruit pulp, NO RED)                                     Popsicles (NO RED)                                                                  Juice: apple, WHITE grape, WHITE cranberry Sports drinks like Gatorade or Powerade (NO RED)                    The day of surgery:  Drink ONE (1) Pre-Surgery G2 at  10:45  AM the morning of surgery. Drink in one sitting.  Do not sip.  This drink was given to you during your hospital pre-op appointment visit. Nothing else to drink after completing the Pre-Surgery  G2 : No candy, chewing gum or throat lozenges.    FOLLOW ANY ADDITIONAL PRE OP INSTRUCTIONS YOU RECEIVED FROM YOUR SURGEON'S OFFICE!!!   Oral Hygiene is also important to reduce your risk of infection.        Remember - BRUSH YOUR TEETH THE MORNING OF SURGERY WITH YOUR REGULAR TOOTHPASTE  Take ONLY these medicines the morning of surgery with A SIP OF WATER: Propafenone (Rythmol),  Metoprolol                You may not have any metal on your body including hair pins, jewelry, and body piercing  Do not wear make-up, lotions, powders, perfumes or deodorant  Men may shave face and neck.  You may bring a small overnight bag with you on the day of surgery, only pack items that are not valuable. [facility] IS NOT RESPONSIBLE   FOR VALUABLES THAT ARE LOST OR STOLEN.   Do not bring your home medications to the hospital. The Pharmacy will dispense medications listed on your medication list to you during your admission in the Hospital.  Special Instructions: Bring a copy of your healthcare power of attorney and living will documents the day of surgery, if you wish to have them scanned into your [facility] Records- EPIC  Please read over the following fact sheets you were given: IF YOU HAVE QUESTIONS ABOUT YOUR PRE-OP  INSTRUCTIONS, PLEASE CALL [phone]  ([place])   [facility] - Preparing for Surgery Before surgery, you can play an important role.  Because skin is not sterile, your skin needs to be as free of germs as possible.  You can reduce the number of germs on your skin by washing with CHG (chlorahexidine gluconate) soap before surgery.  CHG is an antiseptic cleaner which kills germs and bonds with the skin to continue killing germs even after washing. Please DO NOT use if you have an allergy to CHG or antibacterial soaps.  If your skin becomes reddened/irritated stop using the CHG and inform your nurse when you arrive at Short Stay. Do not shave (including legs and underarms) for at least 48 hours prior to the first CHG shower.  You may shave your face/neck.  Please follow these instructions carefully:  1.  Shower with CHG Soap the night before surgery and the  morning of surgery.  2.  If you choose to wash your hair, wash your hair first as usual with your normal  shampoo.  3.  After you shampoo, rinse your hair and body thoroughly to remove the shampoo.                             4.  Use CHG as you would any other liquid soap.  You can apply chg directly to the skin and wash.  Gently with a scrungie or clean washcloth.  5.  Apply the CHG Soap to your body ONLY FROM THE NECK DOWN.   Do not use on face/ open                           Wound or open sores. Avoid contact with eyes, ears mouth and genitals (private parts).  Wash face,  Genitals (private parts) with your normal soap.             6.  Wash thoroughly, paying special attention to the area where your  surgery  will be performed.  7.  Thoroughly rinse your body with warm water from the neck down.  8.  DO NOT shower/wash with your normal soap after using and rinsing off the CHG Soap.            9.  Pat yourself dry with a clean towel.            10.  Wear clean pajamas.            11.  Place clean sheets on your bed the  night of your first shower and do not  sleep with pets.  ON THE DAY OF SURGERY : Do not apply any lotions/deodorants the morning of surgery.  Please wear clean clothes to the hospital/surgery center.    FAILURE TO FOLLOW THESE INSTRUCTIONS MAY RESULT IN THE CANCELLATION OF YOUR SURGERY  PATIENT SIGNATURE_________________________________  NURSE SIGNATURE__________________________________  ________________________________________________________________________        Adam Phenix    An incentive spirometer is a tool that can help keep your lungs clear and active. This tool measures how well you are filling your lungs with each breath. Taking long deep breaths may help reverse or decrease the chance of developing breathing (pulmonary) problems (especially infection) following: A long period of time when you are unable to move or be active. BEFORE THE PROCEDURE  If the spirometer includes an indicator to show your best effort, your nurse or respiratory therapist will set it to a desired goal. If possible, sit up straight or lean slightly forward. Try not to slouch. Hold the incentive spirometer in an upright position. INSTRUCTIONS FOR USE  Sit on the edge of your bed if possible, or sit up as far as you can in bed or on a chair. Hold the incentive spirometer in an upright position. Breathe out normally. Place the mouthpiece in your mouth and seal your lips tightly around it. Breathe in slowly and as deeply as possible, raising the piston or the ball toward the top of the column. Hold your breath for 3-5 seconds or for as long as possible. Allow the piston or ball to fall to the bottom of the column. Remove the mouthpiece from your mouth and breathe out normally. Rest for a few seconds and repeat Steps 1 through 7 at least 10 times every 1-2 hours when you are awake. Take your time and take a few normal breaths between deep breaths. The spirometer may include an indicator  to show your best effort. Use the indicator as a goal to work toward during each repetition. After each set of 10 deep breaths, practice coughing to be sure your lungs are clear. If you have an incision (the cut made at the time of surgery), support your incision when coughing by placing a pillow or rolled up towels firmly against it. Once you are able to get out of bed, walk around indoors and cough well. You may stop using the incentive spirometer when instructed by your caregiver.  RISKS AND COMPLICATIONS Take your time so you do not get dizzy or light-headed. If you are in pain, you may need to take or ask for pain medication before doing incentive spirometry. It is harder to take a deep breath if you are having pain. AFTER USE Rest and breathe slowly  and easily. It can be helpful to keep track of a log of your progress. Your caregiver can provide you with a simple table to help with this. If you are using the spirometer at home, follow these instructions: [place] IF:  You are having difficultly using the spirometer. You have trouble using the spirometer as often as instructed. Your pain medication is not giving enough relief while using the spirometer. You develop fever of 100.5 F (38.1 C) or higher.                                                                                                    SEEK IMMEDIATE MEDICAL CARE IF:  You cough up bloody sputum that had not been present before. You develop fever of 102 F (38.9 C) or greater. You develop worsening pain at or near the incision site. MAKE SURE YOU:  Understand these instructions. Will watch your condition. Will get help right away if you are not doing well or get worse. Document Released: 07/15/2006 Document Revised: 05/27/2011 Document Reviewed: 09/15/2006 [facility] Patient Information 2014 [place], [place].

## 2022-04-12 ENCOUNTER — Encounter (HOSPITAL_COMMUNITY): Payer: Self-pay

## 2022-04-12 ENCOUNTER — Other Ambulatory Visit: Payer: Self-pay

## 2022-04-12 ENCOUNTER — Encounter (HOSPITAL_COMMUNITY)
Admission: RE | Admit: 2022-04-12 | Discharge: 2022-04-12 | Disposition: A | Discharge status: Home / Self Care | Payer: Medicare PPO | Source: Ambulatory Visit | Attending: Orthopedic Surgery | Admitting: Orthopedic Surgery

## 2022-04-12 VITALS — HR 64 | Temp 98.0°F | Resp 18 | Ht 68.0 in | Wt 230.5 lb

## 2022-04-12 DIAGNOSIS — Z87891 Personal history of nicotine dependence: Secondary | ICD-10-CM | POA: Diagnosis not present

## 2022-04-12 DIAGNOSIS — R7303 Prediabetes: Secondary | ICD-10-CM | POA: Diagnosis not present

## 2022-04-12 DIAGNOSIS — Z01812 Encounter for preprocedural laboratory examination: Secondary | ICD-10-CM | POA: Insufficient documentation

## 2022-04-12 DIAGNOSIS — Z01818 Encounter for other preprocedural examination: Secondary | ICD-10-CM

## 2022-04-12 DIAGNOSIS — M1711 Unilateral primary osteoarthritis, right knee: Secondary | ICD-10-CM | POA: Diagnosis not present

## 2022-04-12 DIAGNOSIS — I2510 Atherosclerotic heart disease of native coronary artery without angina pectoris: Secondary | ICD-10-CM | POA: Diagnosis not present

## 2022-04-12 DIAGNOSIS — I4891 Unspecified atrial fibrillation: Secondary | ICD-10-CM | POA: Insufficient documentation

## 2022-04-12 HISTORY — DX: Prediabetes: R73.03

## 2022-04-12 LAB — CBC
HCT: 42.1 % (ref 36.0–46.0)
Hemoglobin: 13.8 g/dL (ref 12.0–15.0)
MCH: 31.9 pg (ref 26.0–34.0)
MCHC: 32.8 g/dL (ref 30.0–36.0)
MCV: 97.2 fL (ref 80.0–100.0)
Platelets: 213 10*3/uL (ref 150–400)
RBC: 4.33 MIL/uL (ref 3.87–5.11)
RDW: 12.8 % (ref 11.5–15.5)
WBC: 6.7 10*3/uL (ref 4.0–10.5)
nRBC: 0 % (ref 0.0–0.2)

## 2022-04-12 LAB — BASIC METABOLIC PANEL
Anion gap: 8 (ref 5–15)
BUN: 19 mg/dL (ref 8–23)
CO2: 25 mmol/L (ref 22–32)
Calcium: 9.4 mg/dL (ref 8.9–10.3)
Chloride: 104 mmol/L (ref 98–111)
Creatinine, Ser: 1.19 mg/dL — ABNORMAL HIGH (ref 0.44–1.00)
GFR, Estimated: 49 mL/min — ABNORMAL LOW (ref >60)
Glucose, Bld: 104 mg/dL — ABNORMAL HIGH (ref 70–99)
Potassium: 4.5 mmol/L (ref 3.5–5.1)
Sodium: 137 mmol/L (ref 135–145)

## 2022-04-12 LAB — SURGICAL PCR SCREEN
MRSA, PCR: NEGATIVE
Staphylococcus aureus: NEGATIVE

## 2022-04-12 LAB — GLUCOSE, CAPILLARY: Glucose-Capillary: 102 mg/dL — ABNORMAL HIGH (ref 70–99)

## 2022-04-15 NOTE — Progress Notes (Signed)
Anesthesia Chart Review   Case: [id number] Date/Time: 04/22/22 0805   Procedure: TOTAL KNEE ARTHROPLASTY (Right: Knee)   Anesthesia type: Choice   Pre-op diagnosis: right knee osteoarthritis   Location: [facility] ROOM 10 / [facility]   Surgeons: Gaynelle Arabian, MD       DISCUSSION:72 y.o. former smoker with h/o PONV, a-fib, right knee OA scheduled for above procedure 04/22/2022 with Dr. Gaynelle Arabian.   Pt last seen by cardiology 03/05/2022. A-fib s/p ablation.  Not on anticoagulants given no recurrence in many years. Normal coaronary angiography 2018. Echo ordered at this visit.   Echo with EF 55-60% with no valvular problems.   Anticipate pt can proceed with planned procedure barring acute status change.   VS: Pulse 64   Temp 36.7 C (Oral)   Resp 18   Ht '5\' 8"'$  (1.727 m)   Wt 104.6 kg   LMP 03/18/2001   SpO2 98%   BMI 35.05 kg/m   PROVIDERS: Abran Richard, MD is PCP   Cardiologist - Sanda Klein, MD  LABS: Labs reviewed: Acceptable for surgery. (all labs ordered are listed, but only abnormal results are displayed)  Labs Reviewed  BASIC METABOLIC PANEL - Abnormal; Notable for the following components:      Result Value   Glucose, Bld 104 (*)    Creatinine, Ser 1.19 (*)    GFR, Estimated 49 (*)    All other components within normal limits  GLUCOSE, CAPILLARY - Abnormal; Notable for the following components:   Glucose-Capillary 102 (*)    All other components within normal limits  SURGICAL PCR SCREEN  CBC     IMAGES:   EKG:   CV: Echo 04/04/2022 1. Left ventricular ejection fraction, by estimation, is 55 to 60%. Left  ventricular ejection fraction by 3D volume is 57 %. The left ventricle has  normal function. The left ventricle has no regional wall motion  abnormalities. Left ventricular diastolic   parameters are consistent with Grade I diastolic dysfunction (impaired  relaxation). The average left ventricular global longitudinal strain is  -26.3 %. The global  longitudinal strain is normal.   2. Right ventricular systolic function is normal. The right ventricular  size is normal. There is normal pulmonary artery systolic pressure. The  estimated right ventricular systolic pressure is 48.5 mmHg.   3. The mitral valve is grossly normal. trivial to mild mitral valve  regurgitation.   4. The aortic valve is tricuspid. Aortic valve regurgitation is not  visualized. Aortic valve sclerosis is present, with no evidence of aortic  valve stenosis.   5. The inferior vena cava is normal in size with greater than 50%  respiratory variability, suggesting right atrial pressure of 3 mmHg.   Cardiac Cath 07/29/2016 The left ventricular systolic function is normal. LV end diastolic pressure is mildly elevated.   1. Normal coronary anatomy. 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 Normal LV systolic function 3. Mildly elevated LVEDP   Plan: medical management.  Past Medical History:  Diagnosis Date   Allergy    Arthritis    Asthma    Atrial fibrillation ([place])    Blood transfusion without reported diagnosis    as a child--age 72   Cancer ([place])    Mohs surgery for skin cancer on right side of nose    COVID-19 01/2019   Dizziness    Dyspareunia    Dysrhythmia    atrial fib   Elevated serum creatinine    Heart murmur  Hematuria    hx of has been worked up and no problems    Hx of adenomatous polyp of colon 03/06/2018   Hx of seasonal allergies    tx. Allegra, Flonase   Obesity    Osteoporosis    Pneumonia    hx of    PONV (postoperative nausea and vomiting)    however no problems with past surgeries here.   Pre-diabetes     Past Surgical History:  Procedure Laterality Date   [employer] STUDY AND ABLATION  1997 & 2011   x 2   Carpel Tunnel Bilateral 1993   CERVICAL CONE BIOPSY     laparoscopic BSO--endometrioma   CESAREAN SECTION  1986   COLONOSCOPY  2009   Dr. Algis Greenhouse [place]   LEFT HEART  CATH AND CORONARY ANGIOGRAPHY N/A 07/29/2016   Procedure: Left Heart Cath and Coronary Angiography;  Surgeon: Martinique, Peter M, MD;  Location: [facility];  Service: Cardiovascular;  Laterality: N/A;   OOPHORECTOMY  2009   REVERSE SHOULDER ARTHROPLASTY Left 02/29/2016   Procedure: REVERSE SHOULDER ARTHROPLASTY;  Surgeon: Justice Britain, MD;  Location: [facility];  Service: Orthopedics;  Laterality: Left;   REVERSE SHOULDER ARTHROPLASTY Right 06/26/2017   Procedure: RIGHT REVERSE SHOULDER ARTHROPLASTY;  Surgeon: Justice Britain, MD;  Location: [facility];  Service: Orthopedics;  Laterality: Right;   ROTATOR CUFF REPAIR Right 1999   x2 -'97, 99   SUPERFICIAL PERONEAL NERVE RELEASE Right    Right elbow nerve release   TOTAL HIP ARTHROPLASTY  12/27/2010   x2 replacements, x1 repair.   TOTAL HIP ARTHROPLASTY Right 03/08/2015   Procedure: TOTAL RIGHT  HIP ARTHROPLASTY ANTERIOR APPROACH;  Surgeon: Gaynelle Arabian, MD;  [facility];  Service: Orthopedics;  Laterality: Right;   TOTAL KNEE ARTHROPLASTY Left 12/07/2012   Procedure: LEFT TOTAL KNEE ARTHROPLASTY;  Surgeon: Gearlean Alf, MD;  [facility];  Service: Orthopedics;  Laterality: Left;   UPPER GASTROINTESTINAL ENDOSCOPY  2009   Shiflett, NL    MEDICATIONS:  acetaminophen (TYLENOL) 500 MG tablet   albuterol (PROVENTIL) (2.5 MG/3ML) 0.083% nebulizer solution   aspirin EC 81 MG tablet   Calcium Carb-Cholecalciferol (CALCIUM 600+D3 PO)   ibuprofen (ADVIL,MOTRIN) 800 MG tablet   metoprolol tartrate (LOPRESSOR) 25 MG tablet   Probiotic Product (PROBIOTIC PO)   propafenone (RYTHMOL) 150 MG tablet   No current facility-administered medications for this encounter.     Konrad Felix Ward, PA-C [facility]Surgical [facility] [phone]

## 2022-04-21 NOTE — Anesthesia Preprocedure Evaluation (Signed)
Anesthesia Evaluation  Patient identified by MRN, date of birth, ID band Patient awake    Reviewed: Allergy & Precautions, NPO status , Patient's Chart, lab work & pertinent test results, reviewed documented beta blocker date and time   History of Anesthesia Complications (+) PONV and history of anesthetic complications  Airway Mallampati: I  TM Distance: >3 FB Neck ROM: Full    Dental no notable dental hx. (+) Teeth Intact, Dental Advisory Given   Pulmonary asthma , former smoker   Pulmonary exam normal breath sounds clear to auscultation       Cardiovascular Normal cardiovascular exam+ dysrhythmias Atrial Fibrillation  Rhythm:Regular Rate:Normal  TTE 2024 1. Left ventricular ejection fraction, by estimation, is 55 to 60%. Left  ventricular ejection fraction by 3D volume is 57 %. The left ventricle has  normal function. The left ventricle has no regional wall motion  abnormalities. Left ventricular diastolic   parameters are consistent with Grade I diastolic dysfunction (impaired  relaxation). The average left ventricular global longitudinal strain is  -26.3 %. The global longitudinal strain is normal.   2. Right ventricular systolic function is normal. The right ventricular  size is normal. There is normal pulmonary artery systolic pressure. The  estimated right ventricular systolic pressure is 12.4 mmHg.   3. The mitral valve is grossly normal. trivial to mild mitral valve  regurgitation.   4. The aortic valve is tricuspid. Aortic valve regurgitation is not  visualized. Aortic valve sclerosis is present, with no evidence of aortic  valve stenosis.   5. The inferior vena cava is normal in size with greater than 50%  respiratory variability, suggesting right atrial pressure of 3 mmHg.   Cath 2018 1. Normal coronary anatomy. 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 Normal LV systolic function 3. Mildly elevated  LVEDP     Neuro/Psych negative neurological ROS  negative psych ROS   GI/Hepatic negative GI ROS, Neg liver ROS,,,  Endo/Other  negative endocrine ROS    Renal/GU negative Renal ROS  negative genitourinary   Musculoskeletal  (+) Arthritis ,    Abdominal   Peds  Hematology negative hematology ROS (+)   Anesthesia Other Findings   Reproductive/Obstetrics                             Anesthesia Physical Anesthesia Plan  ASA: 3  Anesthesia Plan: Regional   Post-op Pain Management: Regional block* and Ofirmev IV (intra-op)*   Induction:   PONV Risk Score and Plan: 3 and Treatment may vary due to age or medical condition, Dexamethasone, Ondansetron and TIVA  Airway Management Planned: LMA  Additional Equipment:   Intra-op Plan:   Post-operative Plan:   Informed Consent: I have reviewed the patients History and Physical, chart, labs and discussed the procedure including the risks, benefits and alternatives for the proposed anesthesia with the patient or authorized representative who has indicated his/her understanding and acceptance.     Dental advisory given  Plan Discussed with: CRNA  Anesthesia Plan Comments: (Pt declines spinal anesthesia)       Anesthesia Quick Evaluation

## 2022-04-22 ENCOUNTER — Ambulatory Visit (HOSPITAL_BASED_OUTPATIENT_CLINIC_OR_DEPARTMENT_OTHER): Payer: Medicare PPO | Admitting: Anesthesiology

## 2022-04-22 ENCOUNTER — Ambulatory Visit (HOSPITAL_COMMUNITY)
Admission: RE | Admit: 2022-04-22 | Discharge: 2022-04-23 | Disposition: A | Discharge status: Home / Self Care | Payer: Medicare PPO | Source: Ambulatory Visit | Attending: Orthopedic Surgery | Admitting: Orthopedic Surgery

## 2022-04-22 ENCOUNTER — Ambulatory Visit (HOSPITAL_COMMUNITY): Payer: Medicare PPO | Admitting: Physician Assistant

## 2022-04-22 ENCOUNTER — Encounter (HOSPITAL_COMMUNITY)
Admission: RE | Disposition: A | Discharge status: Home / Self Care | Payer: Self-pay | Source: Ambulatory Visit | Attending: Orthopedic Surgery

## 2022-04-22 ENCOUNTER — Other Ambulatory Visit: Payer: Self-pay

## 2022-04-22 ENCOUNTER — Encounter (HOSPITAL_COMMUNITY): Payer: Self-pay | Admitting: Orthopedic Surgery

## 2022-04-22 DIAGNOSIS — Z87891 Personal history of nicotine dependence: Secondary | ICD-10-CM | POA: Insufficient documentation

## 2022-04-22 DIAGNOSIS — M179 Osteoarthritis of knee, unspecified: Secondary | ICD-10-CM | POA: Diagnosis present

## 2022-04-22 DIAGNOSIS — M1711 Unilateral primary osteoarthritis, right knee: Secondary | ICD-10-CM | POA: Insufficient documentation

## 2022-04-22 DIAGNOSIS — I480 Paroxysmal atrial fibrillation: Secondary | ICD-10-CM | POA: Diagnosis not present

## 2022-04-22 DIAGNOSIS — J45909 Unspecified asthma, uncomplicated: Secondary | ICD-10-CM

## 2022-04-22 DIAGNOSIS — Z79899 Other long term (current) drug therapy: Secondary | ICD-10-CM | POA: Insufficient documentation

## 2022-04-22 DIAGNOSIS — R7303 Prediabetes: Secondary | ICD-10-CM

## 2022-04-22 DIAGNOSIS — I4891 Unspecified atrial fibrillation: Secondary | ICD-10-CM

## 2022-04-22 HISTORY — PX: TOTAL KNEE ARTHROPLASTY: SHX125

## 2022-04-22 LAB — HEMOGLOBIN A1C
Hgb A1c MFr Bld: 5.6 % (ref 4.8–5.6)
Mean Plasma Glucose: 114.02 mg/dL

## 2022-04-22 SURGERY — ARTHROPLASTY, KNEE, TOTAL
Anesthesia: Regional | Site: Knee | Laterality: Right

## 2022-04-22 MED ORDER — FENTANYL CITRATE PF 50 MCG/ML IJ SOSY
50.0000 ug | PREFILLED_SYRINGE | INTRAMUSCULAR | Status: DC
  Administered 2022-04-22: 50 ug via INTRAVENOUS
  Filled 2022-04-22: qty 2

## 2022-04-22 MED ORDER — SODIUM CHLORIDE 0.9 % IV SOLN: INTRAVENOUS | Status: DC

## 2022-04-22 MED ORDER — ONDANSETRON HCL 4 MG PO TABS: 4.0000 mg | ORAL_TABLET | Freq: Four times a day (QID) | ORAL | Status: DC | PRN

## 2022-04-22 MED ORDER — SODIUM CHLORIDE (PF) 0.9 % IJ SOLN
INTRAMUSCULAR | Status: DC | PRN
  Administered 2022-04-22: 60 mL

## 2022-04-22 MED ORDER — CHLORHEXIDINE GLUCONATE 0.12 % MT SOLN
15.0000 mL | Freq: Once | OROMUCOSAL | Status: AC
  Administered 2022-04-22: 15 mL via OROMUCOSAL

## 2022-04-22 MED ORDER — POVIDONE-IODINE 10 % EX SWAB: 2.0000 | Freq: Once | CUTANEOUS | Status: DC

## 2022-04-22 MED ORDER — BUPIVACAINE LIPOSOME 1.3 % IJ SUSP
INTRAMUSCULAR | Status: DC | PRN
  Administered 2022-04-22: 20 mL

## 2022-04-22 MED ORDER — LIDOCAINE 2% (20 MG/ML) 5 ML SYRINGE
INTRAMUSCULAR | Status: DC | PRN
  Administered 2022-04-22: 80 mg via INTRAVENOUS

## 2022-04-22 MED ORDER — DEXAMETHASONE SODIUM PHOSPHATE 10 MG/ML IJ SOLN
8.0000 mg | Freq: Once | INTRAMUSCULAR | Status: AC
  Administered 2022-04-22: 8 mg via INTRAVENOUS

## 2022-04-22 MED ORDER — ROPIVACAINE HCL 5 MG/ML IJ SOLN
INTRAMUSCULAR | Status: DC | PRN
  Administered 2022-04-22: 20 mL via PERINEURAL

## 2022-04-22 MED ORDER — CEFAZOLIN SODIUM-DEXTROSE 2-4 GM/100ML-% IV SOLN
2.0000 g | INTRAVENOUS | Status: AC
  Administered 2022-04-22: 2 g via INTRAVENOUS
  Filled 2022-04-22: qty 100

## 2022-04-22 MED ORDER — FLEET ENEMA 7-19 GM/118ML RE ENEM: 1.0000 | ENEMA | Freq: Once | RECTAL | Status: DC | PRN

## 2022-04-22 MED ORDER — ONDANSETRON HCL 4 MG/2ML IJ SOLN: 4.0000 mg | Freq: Four times a day (QID) | INTRAMUSCULAR | Status: DC | PRN

## 2022-04-22 MED ORDER — HYDROMORPHONE HCL 1 MG/ML IJ SOLN: 0.5000 mg | INTRAMUSCULAR | Status: DC | PRN

## 2022-04-22 MED ORDER — FENTANYL CITRATE (PF) 100 MCG/2ML IJ SOLN
INTRAMUSCULAR | Status: DC | PRN
  Administered 2022-04-22 (×3): 50 ug via INTRAVENOUS

## 2022-04-22 MED ORDER — DEXAMETHASONE SODIUM PHOSPHATE 10 MG/ML IJ SOLN
INTRAMUSCULAR | Status: DC | PRN
  Administered 2022-04-22: 5 mg

## 2022-04-22 MED ORDER — METHOCARBAMOL 500 MG IVPB - SIMPLE MED
500.0000 mg | Freq: Four times a day (QID) | INTRAVENOUS | Status: DC | PRN
  Administered 2022-04-22: 500 mg via INTRAVENOUS

## 2022-04-22 MED ORDER — METOCLOPRAMIDE HCL 5 MG PO TABS: 5.0000 mg | ORAL_TABLET | Freq: Three times a day (TID) | ORAL | Status: DC | PRN

## 2022-04-22 MED ORDER — ALBUTEROL SULFATE (2.5 MG/3ML) 0.083% IN NEBU: 2.5000 mg | INHALATION_SOLUTION | Freq: Four times a day (QID) | RESPIRATORY_TRACT | Status: DC | PRN

## 2022-04-22 MED ORDER — MIDAZOLAM HCL 2 MG/2ML IJ SOLN
1.0000 mg | INTRAMUSCULAR | Status: DC
  Administered 2022-04-22: 2 mg via INTRAVENOUS
  Filled 2022-04-22: qty 2

## 2022-04-22 MED ORDER — SODIUM CHLORIDE (PF) 0.9 % IJ SOLN
INTRAMUSCULAR | Status: AC
  Filled 2022-04-22: qty 10

## 2022-04-22 MED ORDER — PROPOFOL 10 MG/ML IV BOLUS
INTRAVENOUS | Status: DC | PRN
  Administered 2022-04-22: 200 mg via INTRAVENOUS
  Administered 2022-04-22: 50 mg via INTRAVENOUS

## 2022-04-22 MED ORDER — LACTATED RINGERS IV SOLN: INTRAVENOUS | Status: DC

## 2022-04-22 MED ORDER — APIXABAN 2.5 MG PO TABS
2.5000 mg | ORAL_TABLET | Freq: Two times a day (BID) | ORAL | Status: DC
  Administered 2022-04-23: 2.5 mg via ORAL
  Filled 2022-04-22: qty 1

## 2022-04-22 MED ORDER — PROPAFENONE HCL 150 MG PO TABS
150.0000 mg | ORAL_TABLET | Freq: Three times a day (TID) | ORAL | Status: DC
  Administered 2022-04-22 – 2022-04-23 (×3): 150 mg via ORAL
  Filled 2022-04-22 (×4): qty 1

## 2022-04-22 MED ORDER — MENTHOL 3 MG MT LOZG: 1.0000 | LOZENGE | OROMUCOSAL | Status: DC | PRN

## 2022-04-22 MED ORDER — METOPROLOL TARTRATE 25 MG PO TABS
25.0000 mg | ORAL_TABLET | Freq: Every morning | ORAL | Status: DC
  Administered 2022-04-23: 25 mg via ORAL
  Filled 2022-04-22: qty 1

## 2022-04-22 MED ORDER — ORAL CARE MOUTH RINSE: 15.0000 mL | Freq: Once | OROMUCOSAL | Status: AC

## 2022-04-22 MED ORDER — SODIUM CHLORIDE 0.9 % IR SOLN
Status: DC | PRN
  Administered 2022-04-22: 1000 mL

## 2022-04-22 MED ORDER — TRAMADOL HCL 50 MG PO TABS
50.0000 mg | ORAL_TABLET | Freq: Four times a day (QID) | ORAL | Status: DC | PRN
  Administered 2022-04-22: 100 mg via ORAL
  Filled 2022-04-22 (×2): qty 2

## 2022-04-22 MED ORDER — METOPROLOL TARTRATE 50 MG PO TABS
50.0000 mg | ORAL_TABLET | Freq: Every evening | ORAL | Status: DC
  Administered 2022-04-22: 50 mg via ORAL
  Filled 2022-04-22: qty 1

## 2022-04-22 MED ORDER — METHOCARBAMOL 500 MG IVPB - SIMPLE MED
INTRAVENOUS | Status: AC
  Filled 2022-04-22: qty 55

## 2022-04-22 MED ORDER — ACETAMINOPHEN 500 MG PO TABS
1000.0000 mg | ORAL_TABLET | Freq: Four times a day (QID) | ORAL | Status: AC
  Administered 2022-04-22 – 2022-04-23 (×4): 1000 mg via ORAL
  Filled 2022-04-22 (×4): qty 2

## 2022-04-22 MED ORDER — ACETAMINOPHEN 325 MG PO TABS: 325.0000 mg | ORAL_TABLET | Freq: Four times a day (QID) | ORAL | Status: DC | PRN

## 2022-04-22 MED ORDER — SODIUM CHLORIDE (PF) 0.9 % IJ SOLN
INTRAMUSCULAR | Status: AC
  Filled 2022-04-22: qty 50

## 2022-04-22 MED ORDER — PROPOFOL 1000 MG/100ML IV EMUL
INTRAVENOUS | Status: AC
  Filled 2022-04-22: qty 200

## 2022-04-22 MED ORDER — HYDROMORPHONE HCL 1 MG/ML IJ SOLN
0.2500 mg | INTRAMUSCULAR | Status: DC | PRN
  Administered 2022-04-22: 0.5 mg via INTRAVENOUS

## 2022-04-22 MED ORDER — CEFAZOLIN SODIUM-DEXTROSE 2-4 GM/100ML-% IV SOLN
2.0000 g | Freq: Four times a day (QID) | INTRAVENOUS | Status: AC
  Administered 2022-04-22 (×2): 2 g via INTRAVENOUS
  Filled 2022-04-22 (×2): qty 100

## 2022-04-22 MED ORDER — POLYETHYLENE GLYCOL 3350 17 G PO PACK: 17.0000 g | PACK | Freq: Every day | ORAL | Status: DC | PRN

## 2022-04-22 MED ORDER — PHENOL 1.4 % MT LIQD: 1.0000 | OROMUCOSAL | Status: DC | PRN

## 2022-04-22 MED ORDER — BISACODYL 10 MG RE SUPP: 10.0000 mg | Freq: Every day | RECTAL | Status: DC | PRN

## 2022-04-22 MED ORDER — HYDROMORPHONE HCL 1 MG/ML IJ SOLN
INTRAMUSCULAR | Status: AC
  Filled 2022-04-22: qty 1

## 2022-04-22 MED ORDER — FENTANYL CITRATE (PF) 250 MCG/5ML IJ SOLN
INTRAMUSCULAR | Status: AC
  Filled 2022-04-22: qty 5

## 2022-04-22 MED ORDER — PROPOFOL 500 MG/50ML IV EMUL
INTRAVENOUS | Status: AC
  Filled 2022-04-22: qty 50

## 2022-04-22 MED ORDER — METOCLOPRAMIDE HCL 5 MG/ML IJ SOLN: 5.0000 mg | Freq: Three times a day (TID) | INTRAMUSCULAR | Status: DC | PRN

## 2022-04-22 MED ORDER — ACETAMINOPHEN 10 MG/ML IV SOLN
1000.0000 mg | Freq: Four times a day (QID) | INTRAVENOUS | Status: DC
  Administered 2022-04-22: 1000 mg via INTRAVENOUS
  Filled 2022-04-22: qty 100

## 2022-04-22 MED ORDER — PHENYLEPHRINE 80 MCG/ML (10ML) SYRINGE FOR IV PUSH (FOR BLOOD PRESSURE SUPPORT)
PREFILLED_SYRINGE | INTRAVENOUS | Status: DC | PRN
  Administered 2022-04-22: 160 ug via INTRAVENOUS

## 2022-04-22 MED ORDER — OXYCODONE HCL 5 MG PO TABS
5.0000 mg | ORAL_TABLET | ORAL | Status: DC | PRN
  Administered 2022-04-22 (×2): 10 mg via ORAL
  Filled 2022-04-22 (×2): qty 2

## 2022-04-22 MED ORDER — EPHEDRINE SULFATE (PRESSORS) 50 MG/ML IJ SOLN
INTRAMUSCULAR | Status: DC | PRN
  Administered 2022-04-22: 10 mg via INTRAVENOUS

## 2022-04-22 MED ORDER — BUPIVACAINE LIPOSOME 1.3 % IJ SUSP
INTRAMUSCULAR | Status: AC
  Filled 2022-04-22: qty 20

## 2022-04-22 MED ORDER — TRANEXAMIC ACID-NACL 1000-0.7 MG/100ML-% IV SOLN
1000.0000 mg | INTRAVENOUS | Status: AC
  Administered 2022-04-22: 1000 mg via INTRAVENOUS
  Filled 2022-04-22: qty 100

## 2022-04-22 MED ORDER — DEXAMETHASONE SODIUM PHOSPHATE 10 MG/ML IJ SOLN
10.0000 mg | Freq: Once | INTRAMUSCULAR | Status: AC
  Administered 2022-04-23: 10 mg via INTRAVENOUS
  Filled 2022-04-22: qty 1

## 2022-04-22 MED ORDER — METHOCARBAMOL 500 MG PO TABS: 500.0000 mg | ORAL_TABLET | Freq: Four times a day (QID) | ORAL | Status: DC | PRN

## 2022-04-22 MED ORDER — BUPIVACAINE LIPOSOME 1.3 % IJ SUSP: 20.0000 mL | Freq: Once | INTRAMUSCULAR | Status: DC

## 2022-04-22 MED ORDER — DOCUSATE SODIUM 100 MG PO CAPS
100.0000 mg | ORAL_CAPSULE | Freq: Two times a day (BID) | ORAL | Status: DC
  Administered 2022-04-22 – 2022-04-23 (×2): 100 mg via ORAL
  Filled 2022-04-22 (×2): qty 1

## 2022-04-22 MED ORDER — DIPHENHYDRAMINE HCL 12.5 MG/5ML PO ELIX: 12.5000 mg | ORAL_SOLUTION | ORAL | Status: DC | PRN

## 2022-04-22 MED ORDER — ONDANSETRON HCL 4 MG/2ML IJ SOLN
INTRAMUSCULAR | Status: DC | PRN
  Administered 2022-04-22: 4 mg via INTRAVENOUS

## 2022-04-22 SURGICAL SUPPLY — 59 items
ATTUNE MED DOME PAT 38 KNEE (Knees) IMPLANT
ATTUNE PS FEM RT SZ 6 CEM KNEE (Femur) IMPLANT
ATTUNE PSRP INSR SZ6 7 KNEE (Insert) IMPLANT
BAG COUNTER SPONGE SURGICOUNT (BAG) IMPLANT
BAG SPEC THK2 15X12 ZIP CLS (MISCELLANEOUS) ×1
BAG SPNG CNTER NS LX DISP (BAG)
BAG ZIPLOCK 12X15 (MISCELLANEOUS) ×1 IMPLANT
BASE TIBIA ATTUNE KNEE SYS SZ6 (Knees) IMPLANT
BLADE SAG 18X100X1.27 (BLADE) ×1 IMPLANT
BLADE SAW SGTL 11.0X1.19X90.0M (BLADE) ×1 IMPLANT
BNDG CMPR MED 10X6 ELC LF (GAUZE/BANDAGES/DRESSINGS) ×1
BNDG ELASTIC 6X10 VLCR STRL LF (GAUZE/BANDAGES/DRESSINGS) IMPLANT
BNDG ELASTIC 6X5.8 VLCR STR LF (GAUZE/BANDAGES/DRESSINGS) ×1 IMPLANT
BOWL SMART MIX CTS (DISPOSABLE) ×1 IMPLANT
BSPLAT TIB 6 CMNT ROT PLAT STR (Knees) ×1 IMPLANT
CEMENT HV SMART SET (Cement) ×2 IMPLANT
COVER SURGICAL LIGHT HANDLE (MISCELLANEOUS) ×1 IMPLANT
CUFF TOURN SGL QUICK 34 (TOURNIQUET CUFF) ×1
CUFF TRNQT CYL 34X4.125X (TOURNIQUET CUFF) ×1 IMPLANT
DRAPE INCISE IOBAN 66X45 STRL (DRAPES) ×1 IMPLANT
DRAPE U-SHAPE 47X51 STRL (DRAPES) ×1 IMPLANT
DRSG AQUACEL AG ADV 3.5X10 (GAUZE/BANDAGES/DRESSINGS) ×1 IMPLANT
DURAPREP 26ML APPLICATOR (WOUND CARE) ×1 IMPLANT
ELECT REM PT RETURN 15FT ADLT (MISCELLANEOUS) ×1 IMPLANT
GLOVE BIO SURGEON STRL SZ 6.5 (GLOVE) IMPLANT
GLOVE BIO SURGEON STRL SZ7.5 (GLOVE) IMPLANT
GLOVE BIO SURGEON STRL SZ8 (GLOVE) ×1 IMPLANT
GLOVE BIOGEL PI IND STRL 6.5 (GLOVE) IMPLANT
GLOVE BIOGEL PI IND STRL 7.0 (GLOVE) IMPLANT
GLOVE BIOGEL PI IND STRL 8 (GLOVE) ×1 IMPLANT
GOWN STRL REUS W/ TWL LRG LVL3 (GOWN DISPOSABLE) ×1 IMPLANT
GOWN STRL REUS W/ TWL XL LVL3 (GOWN DISPOSABLE) IMPLANT
GOWN STRL REUS W/TWL LRG LVL3 (GOWN DISPOSABLE) ×1
GOWN STRL REUS W/TWL XL LVL3 (GOWN DISPOSABLE)
HANDPIECE INTERPULSE COAX TIP (DISPOSABLE) ×1
HOLDER FOLEY CATH W/STRAP (MISCELLANEOUS) IMPLANT
IMMOBILIZER KNEE 20 (SOFTGOODS) ×1
IMMOBILIZER KNEE 20 THIGH 36 (SOFTGOODS) ×1 IMPLANT
KIT TURNOVER KIT A (KITS) IMPLANT
MANIFOLD NEPTUNE II (INSTRUMENTS) ×1 IMPLANT
NS IRRIG 1000ML POUR BTL (IV SOLUTION) ×1 IMPLANT
PACK TOTAL KNEE CUSTOM (KITS) ×1 IMPLANT
PADDING CAST ABS COTTON 6X4 NS (CAST SUPPLIES) IMPLANT
PADDING CAST COTTON 6X4 STRL (CAST SUPPLIES) ×2 IMPLANT
PIN STEINMAN FIXATION KNEE (PIN) IMPLANT
PROTECTOR NERVE ULNAR (MISCELLANEOUS) ×1 IMPLANT
SET HNDPC FAN SPRY TIP SCT (DISPOSABLE) ×1 IMPLANT
SPIKE FLUID TRANSFER (MISCELLANEOUS) ×1 IMPLANT
STRIP CLOSURE SKIN 1/2X4 (GAUZE/BANDAGES/DRESSINGS) ×2 IMPLANT
SUT MNCRL AB 4-0 PS2 18 (SUTURE) ×1 IMPLANT
SUT STRATAFIX 0 PDS 27 VIOLET (SUTURE) ×1
SUT VIC AB 2-0 CT1 27 (SUTURE) ×3
SUT VIC AB 2-0 CT1 TAPERPNT 27 (SUTURE) ×3 IMPLANT
SUTURE STRATFX 0 PDS 27 VIOLET (SUTURE) ×1 IMPLANT
TIBIA ATTUNE KNEE SYS BASE SZ6 (Knees) ×1 IMPLANT
TRAY FOLEY MTR SLVR 16FR STAT (SET/KITS/TRAYS/PACK) ×1 IMPLANT
TUBE SUCTION HIGH CAP CLEAR NV (SUCTIONS) ×1 IMPLANT
WATER STERILE IRR 1000ML POUR (IV SOLUTION) ×2 IMPLANT
WRAP KNEE MAXI GEL POST OP (GAUZE/BANDAGES/DRESSINGS) ×1 IMPLANT

## 2022-04-22 NOTE — Transfer of Care (Signed)
Immediate Anesthesia Transfer of Care Note  Patient: Donna Moon  Procedure(s) Performed: TOTAL KNEE ARTHROPLASTY (Right: Knee)  Patient Location: PACU  Anesthesia Type:General  Level of Consciousness: awake, alert , and oriented  Airway & Oxygen Therapy: Patient Spontanous Breathing and Patient connected to face mask oxygen  Post-op Assessment: Report given to RN and Post -op Vital signs reviewed and stable  Post vital signs: Reviewed and stable  Last Vitals:  Vitals Value Taken Time  BP 140/109 04/22/22 1005  Temp    Pulse 64 04/22/22 1007  Resp 15 04/22/22 1007  SpO2 100 % 04/22/22 1007  Vitals shown include unvalidated device data.  Last Pain:  Vitals:   04/22/22 0610  TempSrc:   PainSc: 3       Patients Stated Pain Goal: 3 (47/18/55 0158)  Complications: No notable events documented.

## 2022-04-22 NOTE — Discharge Instructions (Addendum)
 Donna Aluisio, MD Total Joint Specialist [facility] [address] [place], [place] [address] [phone]  TOTAL KNEE REPLACEMENT POSTOPERATIVE DIRECTIONS    Knee Rehabilitation, Guidelines Following Surgery  Results after knee surgery are often greatly improved when you follow the exercise, range of motion and muscle strengthening exercises prescribed by your doctor. Safety measures are also important to protect the knee from further injury. If any of these exercises cause you to have increased pain or swelling in your knee joint, decrease the amount until you are comfortable again and slowly increase them. If you have problems or questions, call your caregiver or physical therapist for advice.   HOME CARE INSTRUCTIONS  Remove items at home which could result in a fall. This includes throw rugs or furniture in walking pathways.  ICE to the affected knee as much as tolerated. Icing helps control swelling. If the swelling is well controlled you will be more comfortable and rehab easier. Continue to use ice on the knee for pain and swelling from surgery. You may notice swelling that will progress down to the foot and ankle. This is normal after surgery. Elevate the leg when you are not up walking on it.    Continue to use the breathing machine which will help keep your temperature down. It is common for your temperature to cycle up and down following surgery, especially at night when you are not up moving around and exerting yourself. The breathing machine keeps your lungs expanded and your temperature down. Do not place pillow under the operative knee, focus on keeping the knee straight while resting  DIET You may resume your previous home diet once you are discharged from the hospital.  DRESSING / WOUND CARE / SHOWERING Keep your bulky bandage on for 2 days. On the third post-operative day you may remove the Ace bandage and gauze. There is a waterproof  adhesive bandage on your skin which will stay in place until your first follow-up appointment. Once you remove this you will not need to place another bandage You may begin showering 3 days following surgery, but do not submerge the incision under water.  ACTIVITY For the first 5 days, the key is rest and control of pain and swelling Do your home exercises twice a day starting on post-operative day 3. On the days you go to physical therapy, just do the home exercises once that day. You should rest, ice and elevate the leg for 50 minutes out of every hour. Get up and walk/stretch for 10 minutes per hour. After 5 days you can increase your activity slowly as tolerated. Walk with your walker as instructed. Use the walker until you are comfortable transitioning to a cane. Walk with the cane in the opposite hand of the operative leg. You may discontinue the cane once you are comfortable and walking steadily. Avoid periods of inactivity such as sitting longer than an hour when not asleep. This helps prevent blood clots.  You may discontinue the knee immobilizer once you are able to perform a straight leg raise while lying down. You may resume a sexual relationship in one month or when given the OK by your doctor.  You may return to work once you are cleared by your doctor.  Do not drive a car for 6 weeks or until released by your surgeon.  Do not drive while taking narcotics.  TED HOSE STOCKINGS Wear the elastic stockings on both legs for three weeks following surgery during the   day. You may remove them at night for sleeping.  WEIGHT BEARING Weight bearing as tolerated with assist device (walker, cane, etc) as directed, use it as long as suggested by your surgeon or therapist, typically at least 4-6 weeks.  POSTOPERATIVE CONSTIPATION PROTOCOL Constipation - defined medically as fewer than three stools per week and severe constipation as less than one stool per week.  One of the most common issues  patients have following surgery is constipation.  Even if you have a regular bowel pattern at home, your normal regimen is likely to be disrupted due to multiple reasons following surgery.  Combination of anesthesia, postoperative narcotics, change in appetite and fluid intake all can affect your bowels.  In order to avoid complications following surgery, here are some recommendations in order to help you during your recovery period.  Colace (docusate) - Pick up an over-the-counter form of Colace or another stool softener and take twice a day as long as you are requiring postoperative pain medications.  Take with a full glass of water daily.  If you experience loose stools or diarrhea, hold the colace until you stool forms back up. If your symptoms do not get better within 1 week or if they get worse, check with your doctor. Dulcolax (bisacodyl) - Pick up over-the-counter and take as directed by the product packaging as needed to assist with the movement of your bowels.  Take with a full glass of water.  Use this product as needed if not relieved by Colace only.  MiraLax (polyethylene glycol) - Pick up over-the-counter to have on hand. MiraLax is a solution that will increase the amount of water in your bowels to assist with bowel movements.  Take as directed and can mix with a glass of water, juice, soda, coffee, or tea. Take if you go more than two days without a movement. Do not use MiraLax more than once per day. Call your doctor if you are still constipated or irregular after using this medication for 7 days in a row.  If you continue to have problems with postoperative constipation, please contact the office for further assistance and recommendations.  If you experience "the worst abdominal pain ever" or develop nausea or vomiting, please contact the office immediatly for further recommendations for treatment.  ITCHING If you experience itching with your medications, try taking only a single pain  pill, or even half a pain pill at a time.  You can also use Benadryl over the counter for itching or also to help with sleep.   MEDICATIONS See your medication summary on the "After Visit Summary" that the nursing staff will review with you prior to discharge.  You may have some home medications which will be placed on hold until you complete the course of blood thinner medication.  It is important for you to complete the blood thinner medication as prescribed by your surgeon.  Continue your approved medications as instructed at time of discharge.  Information on my medicine - ELIQUIS (apixaban)  Why was Eliquis prescribed for you? Eliquis was prescribed for you to reduce the risk of blood clots forming after orthopedic surgery.    What do You need to know about Eliquis? Take your Eliquis TWICE DAILY - one tablet in the morning and one tablet in the evening with or without food.  It would be best to take the dose about the same time each day.  If you have difficulty swallowing the tablet whole please discuss with your pharmacist how  to take the medication safely.  Take Eliquis exactly as prescribed by your doctor and DO NOT stop taking Eliquis without talking to the doctor who prescribed the medication.  Stopping without other medication to take the place of Eliquis may increase your risk of developing a clot.  After discharge, you should have regular check-up appointments with your healthcare provider that is prescribing your Eliquis.  What do you do if you miss a dose? If a dose of ELIQUIS is not taken at the scheduled time, take it as soon as possible on the same day and twice-daily administration should be resumed.  The dose should not be doubled to make up for a missed dose.  Do not take more than one tablet of ELIQUIS at the same time.  Important Safety Information A possible side effect of Eliquis is bleeding. You should call your healthcare provider right away if you  experience any of the following: Bleeding from an injury or your nose that does not stop. Unusual colored urine (red or dark brown) or unusual colored stools (red or black). Unusual bruising for unknown reasons. A serious fall or if you hit your head (even if there is no bleeding).  Some medicines may interact with Eliquis and might increase your risk of bleeding or clotting while on Eliquis. To help avoid this, consult your healthcare provider or pharmacist prior to using any new prescription or non-prescription medications, including herbals, vitamins, non-steroidal anti-inflammatory drugs (NSAIDs) and supplements.  This website has more information on Eliquis (apixaban): http://www.eliquis.com/eliquis/home   PRECAUTIONS If you experience chest pain or shortness of breath - call 911 immediately for transfer to the hospital emergency department.  If you develop a fever greater that 101 F, purulent drainage from wound, increased redness or drainage from wound, foul odor from the wound/dressing, or calf pain - CONTACT YOUR SURGEON.                                                   FOLLOW-UP APPOINTMENTS Make sure you keep all of your appointments after your operation with your surgeon and caregivers. You should call the office at the above phone number and make an appointment for approximately two weeks after the date of your surgery or on the date instructed by your surgeon outlined in the "After Visit Summary".  RANGE OF MOTION AND STRENGTHENING EXERCISES  Rehabilitation of the knee is important following a knee injury or an operation. After just a few days of immobilization, the muscles of the thigh which control the knee become weakened and shrink (atrophy). Knee exercises are designed to build up the tone and strength of the thigh muscles and to improve knee motion. Often times heat used for twenty to thirty minutes before working out will loosen up your tissues and help with improving the  range of motion but do not use heat for the first two weeks following surgery. These exercises can be done on a training (exercise) mat, on the floor, on a table or on a bed. Use what ever works the best and is most comfortable for you Knee exercises include:  Leg Lifts - While your knee is still immobilized in a splint or cast, you can do straight leg raises. Lift the leg to 60 degrees, hold for 3 sec, and slowly lower the leg. Repeat 10-20 times 2-3 times daily.  Perform this exercise against resistance later as your knee gets better.  Quad and Hamstring Sets - Tighten up the muscle on the front of the thigh (Quad) and hold for 5-10 sec. Repeat this 10-20 times hourly. Hamstring sets are done by pushing the foot backward against an object and holding for 5-10 sec. Repeat as with quad sets.  Leg Slides: Lying on your back, slowly slide your foot toward your buttocks, bending your knee up off the floor (only go as far as is comfortable). Then slowly slide your foot back down until your leg is flat on the floor again. Angel Wings: Lying on your back spread your legs to the side as far apart as you can without causing discomfort.  A rehabilitation program following serious knee injuries can speed recovery and prevent re-injury in the future due to weakened muscles. Contact your doctor or a physical therapist for more information on knee rehabilitation.   POST-OPERATIVE OPIOID TAPER INSTRUCTIONS: It is important to wean off of your opioid medication as soon as possible. If you do not need pain medication after your surgery it is ok to stop day one. Opioids include: Codeine, Hydrocodone(Norco, Vicodin), Oxycodone(Percocet, oxycontin) and hydromorphone amongst others.  Long term and even short term use of opiods can cause: Increased pain response Dependence Constipation Depression Respiratory depression And more.  Withdrawal symptoms can include Flu like symptoms Nausea, vomiting And more Techniques  to manage these symptoms Hydrate well Eat regular healthy meals Stay active Use relaxation techniques(deep breathing, meditating, yoga) Do Not substitute Alcohol to help with tapering If you have been on opioids for less than two weeks and do not have pain than it is ok to stop all together.  Plan to wean off of opioids This plan should start within one week post op of your joint replacement. Maintain the same interval or time between taking each dose and first decrease the dose.  Cut the total daily intake of opioids by one tablet each day Next start to increase the time between doses. The last dose that should be eliminated is the evening dose.   IF YOU ARE TRANSFERRED TO A SKILLED REHAB FACILITY If the patient is transferred to a skilled rehab facility following release from the hospital, a list of the current medications will be sent to the facility for the patient to continue.  When discharged from the skilled rehab facility, please have the facility set up the patient's [place] prior to being released. Also, the skilled facility will be responsible for providing the patient with their medications at time of release from the facility to include their pain medication, the muscle relaxants, and their blood thinner medication. If the patient is still at the rehab facility at time of the two week follow up appointment, the skilled rehab facility will also need to assist the patient in arranging follow up appointment in our office and any transportation needs.  MAKE SURE YOU:  Understand these instructions.  Get help right away if you are not doing well or get worse.   DENTAL ANTIBIOTICS:  In most cases prophylactic antibiotics for Dental procdeures after total joint surgery are not necessary.  Exceptions are as follows:  1. History of prior total joint infection  2. Severely immunocompromised (Organ Transplant, cancer chemotherapy, Rheumatoid biologic medications  such as [place])  3. Poorly controlled diabetes (A1C &gt; 8.0, blood glucose over 200)  If you have one of these conditions, contact your surgeon for an antibiotic prescription, prior  to your dental procedure.    Pick up stool softner and laxative for home use following surgery while on pain medications. Do not submerge incision under water. Please use good hand washing techniques while changing dressing each day. May shower starting three days after surgery. Please use a clean towel to pat the incision dry following showers. Continue to use ice for pain and swelling after surgery. Do not use any lotions or creams on the incision until instructed by your surgeon.

## 2022-04-22 NOTE — Interval H&P Note (Signed)
History and Physical Interval Note:  04/22/2022 6:50 AM  Donna Moon  has presented today for surgery, with the diagnosis of right knee osteoarthritis.  The various methods of treatment have been discussed with the patient and family. After consideration of risks, benefits and other options for treatment, the patient has consented to  Procedure(s): TOTAL KNEE ARTHROPLASTY (Right) as a surgical intervention.  The patient's history has been reviewed, patient examined, no change in status, stable for surgery.  I have reviewed the patient's chart and labs.  Questions were answered to the patient's satisfaction.     Pilar Plate Seraj Dunnam

## 2022-04-22 NOTE — Anesthesia Procedure Notes (Signed)
Procedure Name: LMA Insertion Date/Time: 04/22/2022 8:36 AM  Performed by: Gean Maidens, CRNAPre-anesthesia Checklist: Patient identified, Emergency Drugs available, Suction available, Patient being monitored and Timeout performed Patient Re-evaluated:Patient Re-evaluated prior to induction Oxygen Delivery Method: Circle system utilized Preoxygenation: Pre-oxygenation with 100% oxygen Induction Type: IV induction Ventilation: Mask ventilation without difficulty LMA: LMA inserted LMA Size: 4.0 Number of attempts: 1 Placement Confirmation: positive ETCO2 and breath sounds checked- equal and bilateral Tube secured with: Tape Dental Injury: Teeth and Oropharynx as per pre-operative assessment

## 2022-04-22 NOTE — Evaluation (Signed)
Physical Therapy Evaluation Patient Details Name: Donna Moon MRN: [id number] DOB: Jul 11, 1950 Today's Date: 04/22/2022  History of Present Illness  72 yo female s/p R TKA 04/22/22. PMH: bil rTSA, bil THA,  Clinical Impression  Pt is s/p TKA resulting in the deficits listed below (see PT Problem List).  Pt amb to/from bathroom ~ 15'x2 with min/guard assist, good stability. Pt is very motivated, anticipate steady progress.   Pt will benefit from skilled PT to increase their independence and safety with mobility to allow discharge to the venue listed below.         Recommendations for follow up therapy are one component of a multi-disciplinary discharge planning process, led by the attending physician.  Recommendations may be updated based on patient status, additional functional criteria and insurance authorization.  Follow Up Recommendations Follow physician's recommendations for discharge plan and follow up therapies      Assistance Recommended at Discharge Intermittent Supervision/Assistance  Patient can return home with the following  Assist for transportation;Help with stairs or ramp for entrance;Assistance with cooking/housework    Equipment Recommendations None recommended by PT  Recommendations for Other Services       Functional Status Assessment Patient has had a recent decline in their functional status and demonstrates the ability to make significant improvements in function in a reasonable and predictable amount of time.     Precautions / Restrictions Precautions Precautions: Knee Precaution Comments: IND SLR Restrictions Weight Bearing Restrictions: No      Mobility  Bed Mobility Overal bed mobility: Needs Assistance Bed Mobility: Supine to Sit     Supine to sit: Min guard, Supervision     General bed mobility comments: no physical assist, min/guard for safety.    Transfers Overall transfer level: Needs assistance Equipment used: Rolling walker (2  wheels) Transfers: Sit to/from Stand Sit to Stand: Min guard, Min assist           General transfer comment: cues for hand placement, light assist to rise and transition to RW    Ambulation/Gait Ambulation/Gait assistance: Min guard Gait Distance (Feet): 15 Feet (x2) Assistive device: Rolling walker (2 wheels) Gait Pattern/deviations: Step-to pattern       General Gait Details: verbal cues for initial sequence and RW positon, good stability, no knee buckling  Stairs            Wheelchair Mobility    Modified Rankin (Stroke Patients Only)       Balance                                             Pertinent Vitals/Pain Pain Assessment Pain Assessment: 0-10 Pain Score: 3  Pain Location: right knee Pain Descriptors / Indicators: Aching, Sore Pain Intervention(s): Limited activity within patient's tolerance, Monitored during session, Premedicated before session, Repositioned, Ice applied    Home Living Family/patient expects to be discharged to:: Private residence Living Arrangements: Spouse/significant other Available Help at Discharge: Family Type of Home: House Home Access: Stairs to enter Entrance Stairs-Rails: Left Entrance Stairs-Number of Steps: 3   Home Layout: One level Home Equipment: Conservation officer, nature (2 wheels) Additional Comments: pt doing knee exercises prior to surgery, bike, etc.    Prior Function Prior Level of Function : Independent/Modified Independent                     Hand Dominance  Extremity/Trunk Assessment   Upper Extremity Assessment Upper Extremity Assessment: Defer to OT evaluation    Lower Extremity Assessment Lower Extremity Assessment: RLE deficits/detail RLE Deficits / Details: ankle WFL, knee extension and hip flexion 3+/5       Communication   Communication: No difficulties  Cognition Arousal/Alertness: Awake/alert Behavior During Therapy: WFL for tasks  assessed/performed Overall Cognitive Status: Within Functional Limits for tasks assessed                                          General Comments      Exercises Total Joint Exercises Ankle Circles/Pumps: AROM, Both, 10 reps Quad Sets: 5 reps, Both, AROM   Assessment/Plan    PT Assessment Patient needs continued PT services  PT Problem List Decreased strength;Decreased range of motion;Decreased activity tolerance;Decreased mobility;Decreased knowledge of precautions;Pain       PT Treatment Interventions DME instruction;Therapeutic exercise;Gait training;Functional mobility training;Therapeutic activities;Patient/family education;Stair training    PT Goals (Current goals can be found in the Care Plan section)  Acute Rehab PT Goals PT Goal Formulation: With patient Time For Goal Achievement: 05/06/22 Potential to Achieve Goals: Good    Frequency 7X/week     Co-evaluation               AM-PAC PT "6 Clicks" Mobility  Outcome Measure Help needed turning from your back to your side while in a flat bed without using bedrails?: A Little Help needed moving from lying on your back to sitting on the side of a flat bed without using bedrails?: A Little Help needed moving to and from a bed to a chair (including a wheelchair)?: A Little Help needed standing up from a chair using your arms (e.g., wheelchair or bedside chair)?: A Little Help needed to walk in hospital room?: A Little Help needed climbing 3-5 steps with a railing? : A Lot 6 Click Score: 17    End of Session Equipment Utilized During Treatment: Gait belt Activity Tolerance: Patient tolerated treatment well Patient left: in chair;with call bell/phone within reach;with chair alarm set;with family/visitor present   PT Visit Diagnosis: Other abnormalities of gait and mobility (R26.89);Difficulty in walking, not elsewhere classified (R26.2)    Time: 7544-9201 PT Time Calculation (min) (ACUTE  ONLY): 20 min   Charges:   PT Evaluation $PT Eval Low Complexity: [place], PT  Acute Rehab Dept [facility]) [phone]  [facility] Weekend Pager [facility] only)  [phone]  04/22/2022   [facility] 04/22/2022, 3:28 PM

## 2022-04-22 NOTE — Anesthesia Procedure Notes (Signed)
Anesthesia Regional Block: Adductor canal block   Pre-Anesthetic Checklist: , timeout performed,  Correct Patient, Correct Site, Correct Laterality,  Correct Procedure, Correct Position, site marked,  Risks and benefits discussed,  Pre-op evaluation,  At surgeon's request and post-op pain management  Laterality: Right  Prep: Maximum Sterile Barrier Precautions used, chloraprep       Needles:  Injection technique: Single-shot  Needle Type: Echogenic Stimulator Needle     Needle Length: 9cm  Needle Gauge: 21     Additional Needles:   Procedures:,,,, ultrasound used (permanent image in chart),,    Narrative:  Start time: 04/22/2022 7:55 AM End time: 04/22/2022 7:58 AM Injection made incrementally with aspirations every 5 mL. Anesthesiologist: Freddrick March, MD

## 2022-04-22 NOTE — Progress Notes (Signed)
Orthopedic Tech Progress Note Patient Details:  Donna Moon 19-Jan-1951 [id number] CPM will be removed at 2:15pm.  CPM Right Knee CPM Right Knee: On Right Knee Flexion (Degrees): 40 Right Knee Extension (Degrees): 10  Post Interventions Patient Tolerated: Well  Linus Salmons Malynn Lucy 04/22/2022, 10:24 AM

## 2022-04-22 NOTE — Progress Notes (Signed)
Orthopedic Tech Progress Note Patient Details:  Donna Moon [birth year] [id number]  CPM Right Knee CPM Right Knee: Off Right Knee Flexion (Degrees): 40 Right Knee Extension (Degrees): 10  Post Interventions Patient Tolerated: Well  Vernona Rieger 04/22/2022, 2:36 PM

## 2022-04-22 NOTE — Op Note (Signed)
OPERATIVE REPORT-TOTAL KNEE ARTHROPLASTY   Pre-operative diagnosis- Osteoarthritis  Right knee(s)  Post-operative diagnosis- Osteoarthritis Right knee(s)  Procedure-  Right  Total Knee Arthroplasty  Surgeon- Dione Plover. Rachit Grim, MD  Assistant- Molli Barrows, PA-C   Anesthesia-  General and Regional  EBL-50 mL   Drains None  Tourniquet time- 38 minutes @ 626 mm Hg  Complications- None  Condition-PACU - hemodynamically stable.   Brief Clinical Note  ARBELL WYCOFF is a 72 y.o. year old female with end stage OA of her right knee with progressively worsening pain and dysfunction. She has constant pain, with activity and at rest and significant functional deficits with difficulties even with ADLs. She has had extensive non-op management including analgesics, injections of cortisone and viscosupplements, and home exercise program, but remains in significant pain with significant dysfunction.Radiographs show bone on bone arthritis medial and patellofemoral. She presents now for right Total Knee Arthroplasty.     Procedure in detail---   The patient is brought into the operating room and positioned supine on the operating table. After successful administration of  General and Regional,   a tourniquet is placed high on the  Right thigh(s) and the lower extremity is prepped and draped in the usual sterile fashion. Time out is performed by the operating team and then the  Right lower extremity is wrapped in Esmarch, knee flexed and the tourniquet inflated to 300 mmHg.       A midline incision is made with a ten blade through the subcutaneous tissue to the level of the extensor mechanism. A fresh blade is used to make a medial parapatellar arthrotomy. Soft tissue over the proximal medial tibia is subperiosteally elevated to the joint line with a knife and into the semimembranosus bursa with a Cobb elevator. Soft tissue over the proximal lateral tibia is elevated with attention being paid to avoiding  the patellar tendon on the tibial tubercle. The patella is everted, knee flexed 90 degrees and the ACL and PCL are removed. Findings are bone on bone medial and patellofemoral with large global osteophytes        The drill is used to create a starting hole in the distal femur and the canal is thoroughly irrigated with sterile saline to remove the fatty contents. The 5 degree Right  valgus alignment guide is placed into the femoral canal and the distal femoral cutting block is pinned to remove 9 mm off the distal femur. Resection is made with an oscillating saw.      The tibia is subluxed forward and the menisci are removed. The extramedullary alignment guide is placed referencing proximally at the medial aspect of the tibial tubercle and distally along the second metatarsal axis and tibial crest. The block is pinned to remove 70m off the more deficient medial  side. Resection is made with an oscillating saw. Size 6is the most appropriate size for the tibia and the proximal tibia is prepared with the modular drill and keel punch for that size.      The femoral sizing guide is placed and size 6 is most appropriate. Rotation is marked off the epicondylar axis and confirmed by creating a rectangular flexion gap at 90 degrees. The size 6 cutting block is pinned in this rotation and the anterior, posterior and chamfer cuts are made with the oscillating saw. The intercondylar block is then placed and that cut is made.      Trial size 6 tibial component, trial size 6 posterior stabilized femur and a  7  mm posterior stabilized rotating platform insert trial is placed. Full extension is achieved with excellent varus/valgus and anterior/posterior balance throughout full range of motion. The patella is everted and thickness measured to be 22  mm. Free hand resection is taken to 12 mm, a 38 template is placed, lug holes are drilled, trial patella is placed, and it tracks normally. Osteophytes are removed off the posterior  femur with the trial in place. All trials are removed and the cut bone surfaces prepared with pulsatile lavage. Cement is mixed and once ready for implantation, the size 6 tibial implant, size  6 posterior stabilized femoral component, and the size 38 patella are cemented in place and the patella is held with the clamp. The trial insert is placed and the knee held in full extension. The Exparel (20 ml mixed with 60 ml saline) is injected into the extensor mechanism, posterior capsule, medial and lateral gutters and subcutaneous tissues.  All extruded cement is removed and once the cement is hard the permanent 7 mm posterior stabilized rotating platform insert is placed into the tibial tray.      The wound is copiously irrigated with saline solution and the extensor mechanism closed with # 0 Stratofix suture. The tourniquet is released for a total tourniquet time of 38  minutes. Flexion against gravity is 140 degrees and the patella tracks normally. Subcutaneous tissue is closed with 2.0 vicryl and subcuticular with running 4.0 Monocryl. The incision is cleaned and dried and steri-strips and a bulky sterile dressing are applied. The limb is placed into a knee immobilizer and the patient is awakened and transported to recovery in stable condition.      Please note that a surgical assistant was a medical necessity for this procedure in order to perform it in a safe and expeditious manner. Surgical assistant was necessary to retract the ligaments and vital neurovascular structures to prevent injury to them and also necessary for proper positioning of the limb to allow for anatomic placement of the prosthesis.   Dione Plover Amontae Ng, MD    04/22/2022, 9:35 AM

## 2022-04-22 NOTE — Anesthesia Postprocedure Evaluation (Signed)
Anesthesia Post Note  Patient: Donna Moon  Procedure(s) Performed: TOTAL KNEE ARTHROPLASTY (Right: Knee)     Patient location during evaluation: PACU Anesthesia Type: Regional and General Level of consciousness: awake and alert Pain management: pain level controlled Vital Signs Assessment: post-procedure vital signs reviewed and stable Respiratory status: spontaneous breathing, nonlabored ventilation, respiratory function stable and patient connected to nasal cannula oxygen Cardiovascular status: blood pressure returned to baseline and stable Postop Assessment: no apparent nausea or vomiting Anesthetic complications: no  No notable events documented.  Last Vitals:  Vitals:   04/22/22 1115 04/22/22 1126  BP: 133/67 120/79  Pulse: 62 65  Resp: 12 16  Temp:  36.6 C  SpO2: 98% 98%    Last Pain:  Vitals:   04/22/22 1200  TempSrc:   PainSc: 6                  Lumi Winslett L Cortlandt Capuano

## 2022-04-23 DIAGNOSIS — M1711 Unilateral primary osteoarthritis, right knee: Secondary | ICD-10-CM | POA: Diagnosis not present

## 2022-04-23 LAB — BASIC METABOLIC PANEL
Anion gap: 9 (ref 5–15)
BUN: 16 mg/dL (ref 8–23)
CO2: 21 mmol/L — ABNORMAL LOW (ref 22–32)
Calcium: 8.7 mg/dL — ABNORMAL LOW (ref 8.9–10.3)
Chloride: 107 mmol/L (ref 98–111)
Creatinine, Ser: 0.88 mg/dL (ref 0.44–1.00)
GFR, Estimated: >60 mL/min (ref >60)
Glucose, Bld: 130 mg/dL — ABNORMAL HIGH (ref 70–99)
Potassium: 4.6 mmol/L (ref 3.5–5.1)
Sodium: 137 mmol/L (ref 135–145)

## 2022-04-23 LAB — CBC
HCT: 33.5 % — ABNORMAL LOW (ref 36.0–46.0)
Hemoglobin: 10.9 g/dL — ABNORMAL LOW (ref 12.0–15.0)
MCH: 32.1 pg (ref 26.0–34.0)
MCHC: 32.5 g/dL (ref 30.0–36.0)
MCV: 98.5 fL (ref 80.0–100.0)
Platelets: 177 10*3/uL (ref 150–400)
RBC: 3.4 MIL/uL — ABNORMAL LOW (ref 3.87–5.11)
RDW: 12.9 % (ref 11.5–15.5)
WBC: 12.2 10*3/uL — ABNORMAL HIGH (ref 4.0–10.5)
nRBC: 0 % (ref 0.0–0.2)

## 2022-04-23 MED ORDER — TRAMADOL HCL 50 MG PO TABS: 50.0000 mg | ORAL_TABLET | Freq: Four times a day (QID) | ORAL | 0 refills | Status: DC | PRN

## 2022-04-23 MED ORDER — OXYCODONE HCL 5 MG PO TABS: 5.0000 mg | ORAL_TABLET | Freq: Four times a day (QID) | ORAL | 0 refills | Status: DC | PRN

## 2022-04-23 MED ORDER — METHOCARBAMOL 500 MG PO TABS: 500.0000 mg | ORAL_TABLET | Freq: Four times a day (QID) | ORAL | 0 refills | Status: DC | PRN

## 2022-04-23 MED ORDER — APIXABAN 2.5 MG PO TABS: 2.5000 mg | ORAL_TABLET | Freq: Two times a day (BID) | ORAL | 0 refills | Status: AC

## 2022-04-23 NOTE — TOC Transition Note (Signed)
Transition of Care [facility]) - CM/SW Discharge Note   Patient Details  Name: Donna Moon MRN: [id number] Date of Birth: 1950/12/13  Transition of Care [facility]) CM/SW Contact:  Lennart Pall, LCSW Phone Number: 04/23/2022, 9:45 AM   Clinical Narrative:     Met with pt and confirming she has all needed DME at home.  OPPT already arranged with [facility].  No TOC needs.  Final next level of care: OP Rehab Barriers to Discharge: No Barriers Identified   Patient Goals and CMS Choice      Discharge Placement                         Discharge Plan and Services Additional resources added to the After Visit Summary for                  DME Arranged: N/A DME Agency: NA                  Social Determinants of Health (SDOH) Interventions SDOH Screenings   Food Insecurity: No Food Insecurity (04/22/2022)  Housing: Low Risk  (04/22/2022)  Transportation Needs: No Transportation Needs (04/22/2022)  Utilities: Not At Risk (04/22/2022)  Tobacco Use: Medium Risk (04/22/2022)     Readmission Risk Interventions     No data to display

## 2022-04-23 NOTE — Progress Notes (Signed)
Physical Therapy Treatment Patient Details Name: Donna Moon MRN: [id number] DOB: 1950/03/25 Today's Date: 04/23/2022   History of Present Illness 72 yo female s/p R TKA 04/22/22. PMH: bil rTSA, bil THA,    PT Comments    Pt making excellent progress, meeting PT goals. Pt and husband feel ready to d/c today. See below for mobility.    Recommendations for follow up therapy are one component of a multi-disciplinary discharge planning process, led by the attending physician.  Recommendations may be updated based on patient status, additional functional criteria and insurance authorization.  Follow Up Recommendations  Follow physician's recommendations for discharge plan and follow up therapies     Assistance Recommended at Discharge Intermittent Supervision/Assistance  Patient can return home with the following Assist for transportation;Help with stairs or ramp for entrance;Assistance with cooking/housework   Equipment Recommendations  None recommended by PT    Recommendations for Other Services       Precautions / Restrictions Precautions Precautions: Knee Precaution Comments: IND SLR Required Braces or Orthoses:  (no need for KI, pt educated on use if needed) Restrictions Weight Bearing Restrictions: No     Mobility  Bed Mobility               General bed mobility comments: in recliner    Transfers   Equipment used: Rolling walker (2 wheels) Transfers: Sit to/from Stand Sit to Stand: Supervision           General transfer comment: cues for hand placement    Ambulation/Gait Ambulation/Gait assistance: Supervision Gait Distance (Feet): 200 Feet Assistive device: Rolling walker (2 wheels) Gait Pattern/deviations: Step-through pattern       General Gait Details: progression to step through with near equal wt shift R/L LE, light use of RW   Stairs Stairs: Yes Stairs assistance: Min guard Stair Management: One rail Left, Step to pattern, With cane,  Forwards Number of Stairs: 4 General stair comments: cues for sequence and technique, good stability, no knee buckling   Wheelchair Mobility    Modified Rankin (Stroke Patients Only)       Balance                                            Cognition Arousal/Alertness: Awake/alert Behavior During Therapy: WFL for tasks assessed/performed Overall Cognitive Status: Within Functional Limits for tasks assessed                                          Exercises Total Joint Exercises Ankle Circles/Pumps: AROM, Both, 10 reps Quad Sets: 5 reps, Both, AROM Heel Slides: AAROM, AROM, Right, 10 reps Straight Leg Raises: AROM, Right, 10 reps Goniometric ROM: grossly 8 to 75 degrees knee flexion    General Comments        Pertinent Vitals/Pain Pain Assessment Pain Assessment: 0-10 Pain Score: 5  Pain Location: right knee Pain Descriptors / Indicators: Aching, Sore Pain Intervention(s): Limited activity within patient's tolerance, Monitored during session, Premedicated before session, Repositioned, Ice applied    Home Living                          Prior Function            PT Goals (current goals  can now be found in the care plan section) Acute Rehab PT Goals PT Goal Formulation: With patient Time For Goal Achievement: 05/06/22 Potential to Achieve Goals: Good Progress towards PT goals: Progressing toward goals    Frequency    7X/week      PT Plan Current plan remains appropriate    Co-evaluation              AM-PAC PT "6 Clicks" Mobility   Outcome Measure  Help needed turning from your back to your side while in a flat bed without using bedrails?: A Little Help needed moving from lying on your back to sitting on the side of a flat bed without using bedrails?: A Little Help needed moving to and from a bed to a chair (including a wheelchair)?: A Little Help needed standing up from a chair using your arms  (e.g., wheelchair or bedside chair)?: A Little Help needed to walk in hospital room?: A Little Help needed climbing 3-5 steps with a railing? : A Little 6 Click Score: 18    End of Session Equipment Utilized During Treatment: Gait belt Activity Tolerance: Patient tolerated treatment well Patient left: in chair;with call bell/phone within reach;with chair alarm set;with family/visitor present Nurse Communication: Mobility status PT Visit Diagnosis: Other abnormalities of gait and mobility (R26.89);Difficulty in walking, not elsewhere classified (R26.2)     Time: 1020-1035 PT Time Calculation (min) (ACUTE ONLY): 15 min  Charges:  $Gait Training: 8-22 mins                     Baxter Flattery, PT  Acute Rehab Dept [facility]) [phone]  [facility] Weekend Pager [facility] only)  [phone]  04/23/2022    [facility] 04/23/2022, 10:45 AM

## 2022-04-23 NOTE — Plan of Care (Signed)

## 2022-04-23 NOTE — Progress Notes (Signed)
Subjective: 1 Day Post-Op Procedure(s) (LRB): TOTAL KNEE ARTHROPLASTY (Right) Patient seen in rounds by Dr. Wynelle Link. Patient is well, and has had no acute complaints or problems. Denies SOB or chest pain. Denies calf pain. Patient reports pain as mild. Worked with physical therapy yesterday and ambulated 30'. We will continue physical therapy today.  Objective: Vital signs in last 24 hours: Temp:  [97.5 F (36.4 C)-98.1 F (36.7 C)] 97.5 F (36.4 C) (02/06 0520) Pulse Rate:  [60-69] 67 (02/06 0520) Resp:  [12-20] 18 (02/06 0520) BP: (116-150)/(56-109) 125/59 (02/06 0520) SpO2:  [91 %-100 %] 96 % (02/06 0520)  Intake/Output from previous day:  Intake/Output Summary (Last 24 hours) at 04/23/2022 0716 Last data filed at 04/23/2022 2440 Gross per 24 hour  Intake 3828.79 ml  Output 2650 ml  Net 1178.79 ml     Intake/Output this shift: No intake/output data recorded.  Labs: Recent Labs    04/23/22 0311  HGB 10.9*   Recent Labs    04/23/22 0311  WBC 12.2*  RBC 3.40*  HCT 33.5*  PLT 177   Recent Labs    04/23/22 0311  NA 137  K 4.6  CL 107  CO2 21*  BUN 16  CREATININE 0.88  GLUCOSE 130*  CALCIUM 8.7*   No results for input(s): "LABPT", "INR" in the last 72 hours.  Exam: General - Patient is Alert and Oriented Extremity - Neurologically intact Neurovascular intact Sensation intact distally Dorsiflexion/Plantar flexion intact Dressing - dressing C/D/I Motor Function - intact, moving foot and toes well on exam.  Past Medical History:  Diagnosis Date   Allergy    Arthritis    Asthma    Atrial fibrillation ([place])    Blood transfusion without reported diagnosis    as a child--age 44   Cancer ([place])    Mohs surgery for skin cancer on right side of nose    COVID-19 01/2019   Dizziness    Dyspareunia    Dysrhythmia    atrial fib   Elevated serum creatinine    Heart murmur    Hematuria    hx of has been worked up and no problems    Hx of adenomatous  polyp of colon 03/06/2018   Hx of seasonal allergies    tx. Allegra, Flonase   Obesity    Osteoporosis    Pneumonia    hx of    PONV (postoperative nausea and vomiting)    however no problems with past surgeries here.   Pre-diabetes     Assessment/Plan: 1 Day Post-Op Procedure(s) (LRB): TOTAL KNEE ARTHROPLASTY (Right) Principal Problem:   OA (osteoarthritis) of knee  Estimated body mass index is 35.05 kg/m as calculated from the following:   Height as of this encounter: '5\' 8"'$  (1.727 m).   Weight as of this encounter: 104.6 kg. Advance diet Up with therapy D/C IV fluids  Patient's anticipated LOS is less than 2 midnights, meeting these requirements: - Lives within 1 hour of care - Has a competent adult at home to recover with post-op - NO history of  - Chronic pain requiring opiods  - Diabetes  - Coronary Artery Disease  - Heart failure  - Heart attack  - Stroke  - DVT/VTE  - Respiratory Failure/COPD  - Renal failure  - Anemia  - Advanced Liver disease   DVT Prophylaxis -  Eliquis Weight bearing as tolerated.  Continue physical therapy. Expected discharge home today pending progress with PT and if meeting patient goals.  Scheduled for OPPT at [facility] in [place]. Follow-up in clinic in 2 weeks.  The PDMP database was reviewed today prior to any opioid medications being prescribed to this patient.  R. Jaynie Bream, PA-C Orthopedic Surgery [phone] 04/23/2022, 7:16 AM

## 2022-04-25 ENCOUNTER — Encounter (HOSPITAL_COMMUNITY): Payer: Self-pay | Admitting: Orthopedic Surgery

## 2022-06-17 ENCOUNTER — Other Ambulatory Visit: Payer: Self-pay | Admitting: Cardiovascular Disease

## 2022-11-05 ENCOUNTER — Encounter: Payer: Self-pay | Admitting: Obstetrics and Gynecology

## 2023-02-05 ENCOUNTER — Encounter: Payer: Self-pay | Admitting: Internal Medicine

## 2023-03-11 ENCOUNTER — Ambulatory Visit: Payer: Medicare PPO | Attending: Cardiovascular Disease | Admitting: Cardiovascular Disease

## 2023-03-11 ENCOUNTER — Other Ambulatory Visit: Payer: Self-pay | Admitting: Cardiovascular Disease

## 2023-03-11 ENCOUNTER — Ambulatory Visit (INDEPENDENT_AMBULATORY_CARE_PROVIDER_SITE_OTHER): Payer: Medicare PPO

## 2023-03-11 ENCOUNTER — Encounter: Payer: Self-pay | Admitting: Cardiovascular Disease

## 2023-03-11 VITALS — BP 124/72 | HR 63 | Ht 69.0 in | Wt 193.2 lb

## 2023-03-11 DIAGNOSIS — I358 Other nonrheumatic aortic valve disorders: Secondary | ICD-10-CM

## 2023-03-11 DIAGNOSIS — I480 Paroxysmal atrial fibrillation: Secondary | ICD-10-CM | POA: Diagnosis not present

## 2023-03-11 DIAGNOSIS — I4719 Other supraventricular tachycardia: Secondary | ICD-10-CM

## 2023-03-11 DIAGNOSIS — R42 Dizziness and giddiness: Secondary | ICD-10-CM

## 2023-03-11 DIAGNOSIS — Z79899 Other long term (current) drug therapy: Secondary | ICD-10-CM

## 2023-03-11 DIAGNOSIS — R002 Palpitations: Secondary | ICD-10-CM

## 2023-03-11 NOTE — Progress Notes (Signed)
Patient ID: Donna Moon, female   DOB: [birth year], 72 y.o.   MRN: [id number]     Cardiology Office Note    Date:  03/11/2023   12 months or:  Donna Moon, Donna Moon, Donna Moon, MRN [id number]  PCP:  Alvina Filbert, MD  Cardiologist:   Thurmon Fair, MD   Chief Complaint  Patient presents with   Irregular Heart Beat    History of Present Illness:  Donna Moon is a 72 y.o. female with a history of paroxysmal atrial fibrillation with 2 previous successful ablation procedures and some residual palpitations presumed to be secondary to recurrent paroxysmal atrial arrhythmia. Symptoms are very well controlled on metoprolol and propafenone.  In 2018 a false positive ECG stress stress led to coronary angiography, with normal results.  Since her ablation, we really have not documented atrial flutter or atrial fibrillation, her symptoms seem to be related to frequent isolated PACs and very brief bursts of paroxysmal atrial tachycardia.  She has a systolic murmur at the aortic valve sclerosis, no evidence of stenosis on echo in January 2024.  She continues to exercise regularly.  She goes to the [facility] 4 days a week where she rides 5 to 6 miles on the stationary bicycle or elliptical and participates in water aerobics.  She has lost 62 pounds.  She is avoiding carbohydrates.  There has been a slight increase in the frequency of her palpitations, but they continue to be short-lived and they last for 10-30 minutes and make her a little bit dizzy and slightly short of breath.  Sometimes the symptoms also occur during physical activity at the gym, but she simply slows down her pace without stopping.   She has not had syncope, focal neurological complaints, claudication, lower extremity edema orthopnea or PND.  She underwent right total knee replacement surgery with Dr. Antony Odea in February and now has had replacement of both shoulders, both hips and both knees.  ECG today shows normal sinus rhythm with a narrow QRS,  sharp Q waves in lead I and aVL, normal QTc, no ischemic repolarization abnormalities.  She does not have a history of stroke/TIA and has low embolic risk profile (CHADSvasc 2 for age 69 and female gender only).   Past Medical History:  Diagnosis Date   Allergy    Arthritis    Asthma    Atrial fibrillation (HCC)    Blood transfusion without reported diagnosis    as a child--age 79   Cancer (HCC)    Mohs surgery for skin cancer on right side of nose    COVID-19 01/2019   Dizziness    Dyspareunia    Dysrhythmia    atrial fib   Elevated serum creatinine    Heart murmur    Hematuria    hx of has been worked up and no problems    Hx of adenomatous polyp of colon 03/06/2018   Hx of seasonal allergies    tx. Allegra, Flonase   Obesity    Osteoporosis    Pneumonia    hx of    PONV (postoperative nausea and vomiting)    however no problems with past surgeries here.   Pre-diabetes     Past Surgical History:  Procedure Laterality Date   APPENDECTOMY  1968   CARDIAC ELECTROPHYSIOLOGY STUDY AND ABLATION  1997 & 2011   x 2   Carpel Tunnel Bilateral 1993   CERVICAL CONE BIOPSY     laparoscopic BSO--endometrioma   CESAREAN SECTION  1986  COLONOSCOPY  2009   Dr. Elder Cyphers NL   LEFT HEART CATH AND CORONARY ANGIOGRAPHY N/A 07/29/2016   Procedure: Left Heart Cath and Coronary Angiography;  Surgeon: Swaziland, Peter M, MD;  Location: [facility] INVASIVE CV LAB;  Service: Cardiovascular;  Laterality: N/A;   OOPHORECTOMY  2009   REVERSE SHOULDER ARTHROPLASTY Left 02/29/2016   Procedure: REVERSE SHOULDER ARTHROPLASTY;  Surgeon: Francena Hanly, MD;  Location: [facility] OR;  Service: Orthopedics;  Laterality: Left;   REVERSE SHOULDER ARTHROPLASTY Right 06/26/2017   Procedure: RIGHT REVERSE SHOULDER ARTHROPLASTY;  Surgeon: Francena Hanly, MD;  Location: [facility] OR;  Service: Orthopedics;  Laterality: Right;   ROTATOR CUFF REPAIR Right 1999   x2 -'97, 99   SUPERFICIAL PERONEAL NERVE RELEASE Right    Right elbow  nerve release   TOTAL HIP ARTHROPLASTY  12/27/2010   x2 replacements, x1 repair.   TOTAL HIP ARTHROPLASTY Right 03/08/2015   Procedure: TOTAL RIGHT  HIP ARTHROPLASTY ANTERIOR APPROACH;  Surgeon: Ollen Gross, MD;  Location: [facility] ORS;  Service: Orthopedics;  Laterality: Right;   TOTAL KNEE ARTHROPLASTY Left 12/07/2012   Procedure: LEFT TOTAL KNEE ARTHROPLASTY;  Surgeon: Loanne Drilling, MD;  Location: [facility] ORS;  Service: Orthopedics;  Laterality: Left;   TOTAL KNEE ARTHROPLASTY Right 04/22/2022   Procedure: TOTAL KNEE ARTHROPLASTY;  Surgeon: Ollen Gross, MD;  Location: [facility] ORS;  Service: Orthopedics;  Laterality: Right;   UPPER GASTROINTESTINAL ENDOSCOPY  2009   Shiflett, NL    Outpatient Medications Prior to Visit  Medication Sig Dispense Refill   Calcium Carb-Cholecalciferol (CALCIUM 600+D3 PO) Take 1 tablet by mouth in the morning.     metoprolol tartrate (LOPRESSOR) 25 MG tablet TAKE 1 TABLET BY MOUTH IN THE MORNING AND 2 IN THE EVENING 270 tablet 3   Probiotic Product (PROBIOTIC PO) Take 1 capsule by mouth in the morning.     propafenone (RYTHMOL) 150 MG tablet TAKE (1) TABLET BY MOUTH EVERY EIGHT HOURS. 270 tablet 3   acetaminophen (TYLENOL) 500 MG tablet Take 1,000 mg by mouth daily as needed for moderate pain or headache. (Patient not taking: Reported on 03/11/2023)     albuterol (PROVENTIL) (2.5 MG/3ML) 0.083% nebulizer solution Take 2.5 mg by nebulization every 6 (six) hours as needed for wheezing or shortness of breath. (Patient not taking: Reported on 03/11/2023)     methocarbamol (ROBAXIN) 500 MG tablet Take 1 tablet (500 mg total) by mouth every 6 (six) hours as needed for muscle spasms. (Patient not taking: Reported on 03/11/2023) 40 tablet 0   oxyCODONE (OXY IR/ROXICODONE) 5 MG immediate release tablet Take 1-2 tablets (5-10 mg total) by mouth every 6 (six) hours as needed for severe pain. (Patient not taking: Reported on 03/11/2023) 42 tablet 0   traMADol (ULTRAM) 50 MG tablet Take  1-2 tablets (50-100 mg total) by mouth every 6 (six) hours as needed for moderate pain. (Patient not taking: Reported on 03/11/2023) 40 tablet 0   No facility-administered medications prior to visit.     Allergies:   Gabapentin, Lexapro [escitalopram oxalate], and Xarelto [rivaroxaban]   Social History   Socioeconomic History   Marital status: Married    Spouse name: Not on file   Number of children: 1   Years of education: Not on file   Highest education level: Not on file  Occupational History   Occupation: retired  Tobacco Use   Smoking status: Former    Current packs/day: 0.00    Average packs/day: 0.5 packs/day for 9.0 years (4.5 ttl pk-yrs)  Types: Cigarettes    Start date: 03/18/1977    Quit date: 03/18/1986    Years since quitting: 37.0   Smokeless tobacco: Never  Vaping Use   Vaping status: Never Used  Substance and Sexual Activity   Alcohol use: No    Alcohol/week: 0.0 standard drinks of alcohol   Drug use: No   Sexual activity: Yes    Partners: Male    Birth control/protection: Post-menopausal    Comment: first intercourse >16, less than 5 partners  Other Topics Concern   Not on file  Social History Narrative   Married, retired.  One child.   Former smoker no alcohol tobacco or drug use currently   Social Drivers of Corporate investment banker Strain: Not on file  Food Insecurity: No Food Insecurity (04/22/2022)   Hunger Vital Sign    Worried About Running Out of Food in the Last Year: Never true    Ran Out of Food in the Last Year: Never true  Transportation Needs: No Transportation Needs (04/22/2022)   PRAPARE - Administrator, Civil Service (Medical): No    Lack of Transportation (Non-Medical): No  Physical Activity: Not on file  Stress: Not on file  Social Connections: Not on file     Family History:  The patient's family history includes Atrial fibrillation in her mother; Cancer in her sister; Cancer (age of onset: 65) in her brother;  Colon polyps in her brother and brother; Diabetes in her maternal grandmother, paternal grandmother, sister, and sister; Heart attack in her brother and father; Irritable bowel syndrome in her sister; Kidney disease in her mother; Lupus in her sister; Lymphoma in her father; Ovarian cancer (age of onset: 9) in her mother; Stroke in her mother; Ulcerative colitis in her mother.   ROS:   Please see the history of present illness.    ROS All other systems are reviewed and are negative.   PHYSICAL EXAM:   VS:  BP 124/72 (BP Location: Left Arm, Patient Position: Bed low/side rails up;Sitting, Cuff Size: Large)   Pulse 63   Ht 5\' 9"  (1.753 m)   Wt 193 lb 3.2 oz (87.6 kg)   LMP 03/18/2001   SpO2 95%   BMI 28.53 kg/m       General: Alert, oriented x3, no distress, overweight, no longer obese Head: no evidence of trauma, PERRL, EOMI, no exophtalmos or lid lag, no myxedema, no xanthelasma; normal ears, nose and oropharynx Neck: normal jugular venous pulsations and no hepatojugular reflux; brisk carotid pulses without delay and no carotid bruits Chest: clear to auscultation, no signs of consolidation by percussion or palpation, normal fremitus, symmetrical and full respiratory excursions Cardiovascular: normal position and quality of the apical impulse, regular rhythm, normal first and second heart sounds, 1/6 aortic ejection murmur is early peaking, no diastolic murmurs, rubs or gallops Abdomen: no tenderness or distention, no masses by palpation, no abnormal pulsatility or arterial bruits, normal bowel sounds, no hepatosplenomegaly Extremities: no clubbing, cyanosis or edema; 2+ radial, ulnar and brachial pulses bilaterally; 2+ right femoral, posterior tibial and dorsalis pedis pulses; 2+ left femoral, posterior tibial and dorsalis pedis pulses; no subclavian or femoral bruits Neurological: grossly nonfocal Psych: Normal mood and affect    Wt Readings from Last 3 Encounters:  03/11/23 193 lb  3.2 oz (87.6 kg)  04/22/22 230 lb 8 oz (104.6 kg)  04/12/22 230 lb 8 oz (104.6 kg)    Studies/Labs Reviewed:  Echocardiogram 04/04/2022:  1. Left  ventricular ejection fraction, by estimation, is 55 to 60%. Left  ventricular ejection fraction by 3D volume is 57 %. The left ventricle has  normal function. The left ventricle has no regional wall motion  abnormalities. Left ventricular diastolic   parameters are consistent with Grade I diastolic dysfunction (impaired  relaxation). The average left ventricular global longitudinal strain is  -26.3 %. The global longitudinal strain is normal.   2. Right ventricular systolic function is normal. The right ventricular  size is normal. There is normal pulmonary artery systolic pressure. The  estimated right ventricular systolic pressure is 26.8 mmHg.   3. The mitral valve is grossly normal. trivial to mild mitral valve  regurgitation.   4. The aortic valve is tricuspid. Aortic valve regurgitation is not  visualized. Aortic valve sclerosis is present, with no evidence of aortic  valve stenosis.   5. The inferior vena cava is normal in size with greater than 50%  respiratory variability, suggesting right atrial pressure of 3 mmHg.   EKG:    EKG Interpretation Date/Time:  Tuesday March 11 2023 08:50:50 EST Ventricular Rate:  63 PR Interval:  170 QRS Duration:  90 QT Interval:  436 QTC Calculation: 446 R Axis:   -2  Text Interpretation: Normal sinus rhythm Normal ECG When compared with ECG of 29-Jul-2016 06:01, No significant change was found Confirmed by Royce Stegman ([id number]) on 03/11/2023 8:59:02 AM        Recent Labs: 04/23/2022: BUN 16; Creatinine, Ser 0.88; Hemoglobin 10.9; Platelets 177; Potassium 4.6; Sodium 137  Hemoglobin A1c was 5.6% in February and per her report is 5.2% 2 or 3 months ago when she also had a favorable lipid panel (not available for review).  Lipid Panel    Component Value Date/Time   CHOL 192 05/10/2019  0912   TRIG 132 05/10/2019 0912   HDL 57 05/10/2019 0912   CHOLHDL 3.4 05/10/2019 0912   CHOLHDL 3.1 07/25/2016 0909   VLDL 18 07/25/2016 0909   LDLCALC 112 (H) 05/10/2019 0912    ASSESSMENT:    1. PAT (paroxysmal atrial tachycardia) (HCC)   2. Paroxysmal atrial fibrillation (HCC)   3. Long term current use of antiarrhythmic drug   4. Aortic valve sclerosis      PLAN:  In order of problems listed above:   PAT/PAF: She has noticed a slight increase in the frequency of her palpitations although the events remain relatively brief.  It would be useful to know whether she is having atrial flutter or atrial fibrillation in addition to previously documented atrial tachycardia. CHA2DS2-VASc 2 (age, gender).  She is not on anticoagulants since we have not documented either atrial flutter or atrial fibrillation in many many years.  I encouraged her to get some type of electronic monitoring device such as a smart watch or Kardia mobile.  She can send recordings of symptomatic events through MyChart.  Okay to take additional doses of metoprolol on top of the daily prescription for breakthrough palpitations, but she cannot tolerate higher doses of medication on a daily basis due to sinus bradycardia.   Overweight: She deserves congratulations for an excellent effort at losing weight. Propafenone: No evidence of proarrhythmia.  Normal coronary angiography in 2018.  No angina pectoris. HLP: In the absence of known vascular disease, target LDL less than 130 although preferably less than 100.  Will get labs from PCP. Murmur: Echocardiogram performed earlier this year shows that she has aortic valve sclerosis without stenosis.  Medication Adjustments/Labs and Tests Ordered: Current medicines are reviewed at length with the patient today.  Concerns regarding medicines are outlined above.  Medication changes, Labs and Tests ordered today are listed in the Patient Instructions below. Patient  Instructions  Medication Instructions:  No changes *If you need a refill on your cardiac medications before your next appointment, please call your pharmacy*  Testing/Procedures: Preventice Monitor  Your physician has requested you wear a Preventice monitor for 14 days.  This is a single patch monitor. [employer] supplies one patch monitor per enrollment. Additional stickers are not available. Please do not apply patch if you will be having a Nuclear Stress Test,  Echocardiogram, Cardiac CT, MRI, or Chest Xray during the period you would be wearing the  monitor. The patch cannot be worn during these tests. You cannot remove and re-apply the  monitor.  Your monitor will be mailed 3 day USPS to your address on file. It may take 3-5 days  to receive your monitor after you have been enrolled.  Once you have received your monitor, please review the enclosed instructions. Your monitor  has already been registered assigning a specific monitor serial # to you.  Billing and Patient Assistance Program Information  We have supplied [employer] with any of your insurance information on file for billing purposes. [employer] offers a sliding scale Patient Assistance Program for patients that do not have  insurance, or whose insurance does not completely cover the cost of the ZIO monitor.  You must apply for the Patient Assistance Program to qualify for this discounted rate.  To apply, please call [employer] at [phone], select option 4, select option 2, ask to apply for  Patient Assistance Program. Meredeth Ide will ask your household income, and how many people  are in your household. They will quote your out-of-pocket cost based on that information.  [employer] will also be able to set up a 89-month, interest-free payment plan if needed.  Applying the monitor   Shave hair from upper left chest.  Hold abrader disc by orange tab. Rub abrader in 40 strokes over the upper left chest as  indicated in your monitor  instructions.  Clean area with 4 enclosed alcohol pads. Let dry.  Apply patch as indicated in monitor instructions. Patch will be placed under collarbone on left  side of chest with arrow pointing upward.  Rub patch adhesive wings for 2 minutes. Remove white label marked "1". Remove the white  label marked "2". Rub patch adhesive wings for 2 additional minutes.  While looking in a mirror, press and release button in center of patch. A small green light will  flash 3-4 times. This will be your only indicator that the monitor has been turned on.  Do not shower for the first 24 hours. You may shower after the first 24 hours.  Press the button if you feel a symptom. You will hear a small click. Record Date, Time and  Symptom in the Patient Logbook.  When you are ready to remove the patch, follow instructions on the last 2 pages of Patient  Logbook. Stick patch monitor onto the last page of Patient Logbook.  Place Patient Logbook in the blue and white box. Use locking tab on box and tape box closed  securely. The blue and white box has prepaid postage on it. Please place it in the mailbox as  soon as possible. Your physician should have your test results approximately 7 days after the  monitor has been mailed back  to [employer].  Call [facility] Customer Care at [phone] if you have questions regarding  your ZIO XT patch monitor. Call them immediately if you see an orange light blinking on your  monitor.  If your monitor falls off in less than 4 days, contact our Monitor department at [phone].  If your monitor becomes loose or falls off after 4 days call [employer] at [phone] for  suggestions on securing your monitor    Follow-Up: At [facility], you and your health needs are our priority.  As part of our continuing mission to provide you with exceptional heart care, we have created designated Provider Care Teams.  These Care Teams include your primary  Cardiologist (physician) and Advanced Practice Providers (APPs -  Physician Assistants and Nurse Practitioners) who all work together to provide you with the care you need, when you need it.  We recommend signing up for the patient portal called "MyChart".  Sign up information is provided on this After Visit Summary.  MyChart is used to connect with patients for Virtual Visits (Telemedicine).  Patients are able to view lab/test results, encounter notes, upcoming appointments, etc.  Non-urgent messages can be sent to your provider as well.   To learn more about what you can do with MyChart, go to [email or web address].    Your next appointment:   1 year(s)  Provider:   Thurmon Fair, MD              Signed, Thurmon Fair, MD  03/11/2023 9:17 AM    [facility] [address] [place], [place], [place]  [address] Phone: [phone]; Fax: [phone]

## 2023-03-11 NOTE — Patient Instructions (Signed)
Medication Instructions:  No changes *If you need a refill on your cardiac medications before your next appointment, please call your pharmacy*  Testing/Procedures: Preventice Monitor  Your physician has requested you wear a Preventice monitor for 14 days.  This is a single patch monitor. [employer] supplies one patch monitor per enrollment. Additional stickers are not available. Please do not apply patch if you will be having a Nuclear Stress Test,  Echocardiogram, Cardiac CT, MRI, or Chest Xray during the period you would be wearing the  monitor. The patch cannot be worn during these tests. You cannot remove and re-apply the  monitor.  Your monitor will be mailed 3 day USPS to your address on file. It may take 3-5 days  to receive your monitor after you have been enrolled.  Once you have received your monitor, please review the enclosed instructions. Your monitor  has already been registered assigning a specific monitor serial # to you.  Billing and Patient Assistance Program Information  We have supplied [employer] with any of your insurance information on file for billing purposes. [employer] offers a sliding scale Patient Assistance Program for patients that do not have  insurance, or whose insurance does not completely cover the cost of the ZIO monitor.  You must apply for the Patient Assistance Program to qualify for this discounted rate.  To apply, please call [employer] at [phone], select option 4, select option 2, ask to apply for  Patient Assistance Program. Meredeth Ide will ask your household income, and how many people  are in your household. They will quote your out-of-pocket cost based on that information.  [employer] will also be able to set up a 63-month, interest-free payment plan if needed.  Applying the monitor   Shave hair from upper left chest.  Hold abrader disc by orange tab. Rub abrader in 40 strokes over the upper left chest as  indicated in your monitor instructions.   Clean area with 4 enclosed alcohol pads. Let dry.  Apply patch as indicated in monitor instructions. Patch will be placed under collarbone on left  side of chest with arrow pointing upward.  Rub patch adhesive wings for 2 minutes. Remove white label marked "1". Remove the white  label marked "2". Rub patch adhesive wings for 2 additional minutes.  While looking in a mirror, press and release button in center of patch. A small green light will  flash 3-4 times. This will be your only indicator that the monitor has been turned on.  Do not shower for the first 24 hours. You may shower after the first 24 hours.  Press the button if you feel a symptom. You will hear a small click. Record Date, Time and  Symptom in the Patient Logbook.  When you are ready to remove the patch, follow instructions on the last 2 pages of Patient  Logbook. Stick patch monitor onto the last page of Patient Logbook.  Place Patient Logbook in the blue and white box. Use locking tab on box and tape box closed  securely. The blue and white box has prepaid postage on it. Please place it in the mailbox as  soon as possible. Your physician should have your test results approximately 7 days after the  monitor has been mailed back to [facility].  Call [facility] Customer Care at [phone] if you have questions regarding  your ZIO XT patch monitor. Call them immediately if you see an orange light blinking on your  monitor.  If your monitor falls off  in less than 4 days, contact our Monitor department at [phone].  If your monitor becomes loose or falls off after 4 days call [employer] at [phone] for  suggestions on securing your monitor    Follow-Up: At [facility], you and your health needs are our priority.  As part of our continuing mission to provide you with exceptional heart care, we have created designated Provider Care Teams.  These Care Teams include your primary Cardiologist (physician)  and Advanced Practice Providers (APPs -  Physician Assistants and Nurse Practitioners) who all work together to provide you with the care you need, when you need it.  We recommend signing up for the patient portal called "MyChart".  Sign up information is provided on this After Visit Summary.  MyChart is used to connect with patients for Virtual Visits (Telemedicine).  Patients are able to view lab/test results, encounter notes, upcoming appointments, etc.  Non-urgent messages can be sent to your provider as well.   To learn more about what you can do with MyChart, go to [email or web address].    Your next appointment:   1 year(s)  Provider:   Thurmon Fair, MD

## 2023-03-11 NOTE — Progress Notes (Unsigned)
Enrolled for [employer] to ship a 14 day long term monitor to patient. Patient does water aerobics.

## 2023-03-20 DIAGNOSIS — R002 Palpitations: Secondary | ICD-10-CM | POA: Diagnosis not present

## 2023-03-20 DIAGNOSIS — R42 Dizziness and giddiness: Secondary | ICD-10-CM | POA: Diagnosis not present

## 2023-03-20 DIAGNOSIS — I4719 Other supraventricular tachycardia: Secondary | ICD-10-CM

## 2023-05-27 ENCOUNTER — Other Ambulatory Visit (HOSPITAL_COMMUNITY): Payer: Self-pay | Admitting: Student

## 2023-05-27 ENCOUNTER — Encounter: Payer: Self-pay | Admitting: Cardiovascular Disease

## 2023-05-27 DIAGNOSIS — Z96651 Presence of right artificial knee joint: Secondary | ICD-10-CM

## 2023-05-28 ENCOUNTER — Encounter: Payer: Self-pay | Admitting: Cardiovascular Disease

## 2023-05-28 NOTE — Telephone Encounter (Signed)
 It is there now. Thanks

## 2023-05-28 NOTE — Telephone Encounter (Signed)
 Her monitor final report was scanned in on 04/11/2023, but is still labeled as "Exam begun" and has not been forwarded to my in-basket. She called today asking for results, or I would have not known it was done.  Can we troubleshoot that and make sure it does not happen again? A 7 week delay in reviewing a test such as this is not acceptable.

## 2023-06-17 ENCOUNTER — Encounter (HOSPITAL_COMMUNITY)
Admission: RE | Admit: 2023-06-17 | Discharge: 2023-06-17 | Disposition: A | Discharge status: Home / Self Care | Source: Ambulatory Visit | Attending: Student | Admitting: Student

## 2023-06-17 DIAGNOSIS — Z96651 Presence of right artificial knee joint: Secondary | ICD-10-CM | POA: Insufficient documentation

## 2023-06-17 MED ORDER — TECHNETIUM TC 99M MEDRONATE IV KIT
21.2000 | PACK | Freq: Once | INTRAVENOUS | Status: AC
  Administered 2023-06-17: 21.2 via INTRAVENOUS

## 2023-06-19 ENCOUNTER — Other Ambulatory Visit: Payer: Self-pay | Admitting: Cardiovascular Disease

## 2023-06-24 ENCOUNTER — Other Ambulatory Visit: Payer: Self-pay | Admitting: Cardiovascular Disease

## 2023-08-18 ENCOUNTER — Telehealth (HOSPITAL_BASED_OUTPATIENT_CLINIC_OR_DEPARTMENT_OTHER): Payer: Self-pay | Admitting: *Deleted

## 2023-08-18 ENCOUNTER — Telehealth: Payer: Self-pay

## 2023-08-18 NOTE — Telephone Encounter (Signed)
 S/W pt and scheduled TELE Preop appt 09/09/23. Med Rec and consent done

## 2023-08-18 NOTE — Telephone Encounter (Signed)
 Med Rec and consent done     Patient Consent for Virtual Visit        Donna Moon has provided verbal consent on 08/18/2023 for a virtual visit (video or telephone).   CONSENT FOR VIRTUAL VISIT FOR:  Donna Moon  By participating in this virtual visit I agree to the following:  I hereby voluntarily request, consent and authorize [facility] and its employed or contracted physicians, physician assistants, nurse practitioners or other licensed health care professionals (the Practitioner), to provide me with telemedicine health care services (the "Services") as deemed necessary by the treating Practitioner. I acknowledge and consent to receive the Services by the Practitioner via telemedicine. I understand that the telemedicine visit will involve communicating with the Practitioner through live audiovisual communication technology and the disclosure of certain medical information by electronic transmission. I acknowledge that I have been given the opportunity to request an in-person assessment or other available alternative prior to the telemedicine visit and am voluntarily participating in the telemedicine visit.  I understand that I have the right to withhold or withdraw my consent to the use of telemedicine in the course of my care at any time, without affecting my right to future care or treatment, and that the Practitioner or I may terminate the telemedicine visit at any time. I understand that I have the right to inspect all information obtained and/or recorded in the course of the telemedicine visit and may receive copies of available information for a reasonable fee.  I understand that some of the potential risks of receiving the Services via telemedicine include:  Delay or interruption in medical evaluation due to technological equipment failure or disruption; Information transmitted may not be sufficient (e.g. poor resolution of images) to allow for appropriate medical decision  making by the Practitioner; and/or  In rare instances, security protocols could fail, causing a breach of personal health information.  Furthermore, I acknowledge that it is my responsibility to provide information about my medical history, conditions and care that is complete and accurate to the best of my ability. I acknowledge that Practitioner's advice, recommendations, and/or decision may be based on factors not within their control, such as incomplete or inaccurate data provided by me or distortions of diagnostic images or specimens that may result from electronic transmissions. I understand that the practice of medicine is not an exact science and that Practitioner makes no warranties or guarantees regarding treatment outcomes. I acknowledge that a copy of this consent can be made available to me via my patient portal [facility] MyChart), or I can request a printed copy by calling the office of [facility].    I understand that my insurance will be billed for this visit.   I have read or had this consent read to me. I understand the contents of this consent, which adequately explains the benefits and risks of the Services being provided via telemedicine.  I have been provided ample opportunity to ask questions regarding this consent and the Services and have had my questions answered to my satisfaction. I give my informed consent for the services to be provided through the use of telemedicine in my medical care

## 2023-08-18 NOTE — Telephone Encounter (Signed)
   Pre-operative Risk Assessment    Patient Name: Donna Moon  DOB: 1950/12/13 MRN: [id number]   Date of last office visit: 03/11/2023 Date of next office visit: None  Request for Surgical Clearance    Procedure:  Right Knee Scope with synovectomy  Date of Surgery:  Clearance 10/28/23                                 Surgeon:  Dr.Frank Aluisio Surgeon's Group or Practice Name:  [facility] Phone number:  [phone] Fax number:  [phone]   Type of Clearance Requested:   - Medical    Type of Anesthesia:  Not Indicated   Additional requests/questions:    Signed, Lauris Port   08/18/2023, 11:23 AM

## 2023-08-18 NOTE — Telephone Encounter (Signed)
   Name: Donna Moon  DOB: November 27, 1950  MRN: [id number]  Primary Cardiologist: Luana Rumple, MD  Preoperative team, please contact this patient and set up a phone call appointment for further preoperative risk assessment. Please obtain consent and complete medication review. Thank you for your help.  I confirm that guidance regarding antiplatelet and oral anticoagulation therapy has been completed and, if necessary, noted below. None requested.   I also confirmed the patient resides in the state of [place] . As per [facility] Medical Board telemedicine laws, the patient must reside in the state in which the provider is licensed.  Chaunta Bejarano D Tatum Corl, NP 08/18/2023, 11:46 AM [facility]

## 2023-09-08 NOTE — Progress Notes (Unsigned)
 Virtual Visit via Telephone Note   Because of Donna Moon co-morbid illnesses, she is at least at moderate risk for complications without adequate follow up.  This format is felt to be most appropriate for this patient at this time.  Due to technical limitations with video connection (technology), today's appointment will be conducted as an audio only telehealth visit, and Donna Moon verbally agreed to proceed in this manner.   All issues noted in this document were discussed and addressed.  No physical exam could be performed with this format.  Evaluation Performed:  Preoperative cardiovascular risk assessment _____________   Date:  09/08/2023   Patient ID:  Donna Moon, DOB 18-Jan-1951, MRN [id number] Patient Location:  Home Provider location:   Office  Primary Care Provider:  Margarete Maeola DASEN, FNP Primary Cardiologist:  Jerel Balding, MD  Chief Complaint / Patient Profile   73 y.o. y/o female with a h/o paroxysmal atrial fibrillation, paroxysmal atrial tachycardia, obesity, DOE who is pending right knee arthroscopy with synovectomy and presents today for telephonic preoperative cardiovascular risk assessment.  History of Present Illness    Donna Moon is a 73 y.o. female who presents via audio/video conferencing for a telehealth visit today.  Pt was last seen in cardiology clinic on 03/11/2023 by Dr. Balding.  At that time Donna Moon was doing well .  The patient is now pending procedure as outlined above. Since her last visit, she continues to be stable from a cardiac standpoint.  Today she denies chest pain, shortness of breath, lower extremity edema, fatigue, palpitations, melena, hematuria, hemoptysis, diaphoresis, weakness, presyncope, syncope, orthopnea, and PND.   Past Medical History    Past Medical History:  Diagnosis Date   Allergy    Arthritis    Asthma    Atrial fibrillation (HCC)    Blood transfusion without reported diagnosis    as a child--age 73   Cancer  (HCC)    Mohs surgery for skin cancer on right side of nose    COVID-19 01/2019   Dizziness    Dyspareunia    Dysrhythmia    atrial fib   Elevated serum creatinine    Heart murmur    Hematuria    hx of has been worked up and no problems    Hx of adenomatous polyp of colon 03/06/2018   Hx of seasonal allergies    tx. Allegra, Flonase    Obesity    Osteoporosis    Pneumonia    hx of    PONV (postoperative nausea and vomiting)    however no problems with past surgeries here.   Pre-diabetes    Past Surgical History:  Procedure Laterality Date   APPENDECTOMY  1968   CARDIAC ELECTROPHYSIOLOGY STUDY AND ABLATION  1997 & 2011   x 2   Carpel Tunnel Bilateral 1993   CERVICAL CONE BIOPSY     laparoscopic BSO--endometrioma   CESAREAN SECTION  1986   COLONOSCOPY  2009   Dr. Ivy NL   LEFT HEART CATH AND CORONARY ANGIOGRAPHY N/A 07/29/2016   Procedure: Left Heart Cath and Coronary Angiography;  Surgeon: Swaziland, Peter M, MD;  Location: [facility] INVASIVE CV LAB;  Service: Cardiovascular;  Laterality: N/A;   OOPHORECTOMY  2009   REVERSE SHOULDER ARTHROPLASTY Left 02/29/2016   Procedure: REVERSE SHOULDER ARTHROPLASTY;  Surgeon: Franky Pointer, MD;  Location: [facility] OR;  Service: Orthopedics;  Laterality: Left;   REVERSE SHOULDER ARTHROPLASTY Right 06/26/2017   Procedure: RIGHT REVERSE SHOULDER ARTHROPLASTY;  Surgeon: Melita Drivers, MD;  Location: [facility] OR;  Service: Orthopedics;  Laterality: Right;   ROTATOR CUFF REPAIR Right 1999   x2 -'97, 99   SUPERFICIAL PERONEAL NERVE RELEASE Right    Right elbow nerve release   TOTAL HIP ARTHROPLASTY  12/27/2010   x2 replacements, x1 repair.   TOTAL HIP ARTHROPLASTY Right 03/08/2015   Procedure: TOTAL RIGHT  HIP ARTHROPLASTY ANTERIOR APPROACH;  Surgeon: Dempsey Moan, MD;  Location: [facility] ORS;  Service: Orthopedics;  Laterality: Right;   TOTAL KNEE ARTHROPLASTY Left 12/07/2012   Procedure: LEFT TOTAL KNEE ARTHROPLASTY;  Surgeon: Dempsey LULLA Moan, MD;  Location: [facility]  ORS;  Service: Orthopedics;  Laterality: Left;   TOTAL KNEE ARTHROPLASTY Right 04/22/2022   Procedure: TOTAL KNEE ARTHROPLASTY;  Surgeon: Moan Dempsey, MD;  Location: [facility] ORS;  Service: Orthopedics;  Laterality: Right;   UPPER GASTROINTESTINAL ENDOSCOPY  2009   Shiflett, NL    Allergies  Allergies  Allergen Reactions   Gabapentin Other (See Comments)    Confusion/hallucinations   Lexapro [Escitalopram Oxalate] Other (See Comments)    Flu like symptoms    Xarelto  [Rivaroxaban ] Itching and Other (See Comments)    Home Medications    Prior to Admission medications   Medication Sig Start Date End Date Taking? Authorizing Provider  acetaminophen  (TYLENOL ) 500 MG tablet Take 1,000 mg by mouth daily as needed for moderate pain or headache. Patient not taking: Reported on 03/11/2023    [provider]  albuterol  (PROVENTIL ) (2.5 MG/3ML) 0.083% nebulizer solution Take 2.5 mg by nebulization every 6 (six) hours as needed for wheezing or shortness of breath. Patient not taking: Reported on 03/11/2023    [provider]  Calcium Carb-Cholecalciferol (CALCIUM 600+D3 PO) Take 1 tablet by mouth in the morning.    [provider]  methocarbamol  (ROBAXIN ) 500 MG tablet Take 1 tablet (500 mg total) by mouth every 6 (six) hours as needed for muscle spasms. Patient not taking: Reported on 08/18/2023 04/23/22   Kristian Stabs, PA  metoprolol  tartrate (LOPRESSOR ) 25 MG tablet TAKE 1 TABLET BY MOUTH IN THE MORNING AND 2 IN THE EVENING 06/24/23   Croitoru, Mihai, MD  oxyCODONE  (OXY IR/ROXICODONE ) 5 MG immediate release tablet Take 1-2 tablets (5-10 mg total) by mouth every 6 (six) hours as needed for severe pain. Patient not taking: Reported on 08/18/2023 04/23/22   Kristian Stabs, PA  Probiotic Product (PROBIOTIC PO) Take 1 capsule by mouth in the morning.    [provider]  propafenone  (RYTHMOL ) 150 MG tablet TAKE (1) TABLET BY MOUTH EVERY EIGHT HOURS. 06/19/23   Croitoru,  Mihai, MD  traMADol  (ULTRAM ) 50 MG tablet Take 1-2 tablets (50-100 mg total) by mouth every 6 (six) hours as needed for moderate pain. Patient not taking: Reported on 08/18/2023 04/23/22   Kristian Stabs, PA    Physical Exam    Vital Signs:  Donna Moon does not have vital signs available for review today.  Given telephonic nature of communication, physical exam is limited. AAOx3. NAD. Normal affect.  Speech and respirations are unlabored.  Accessory Clinical Findings    None  Assessment & Plan    1.  Preoperative Cardiovascular Risk Assessment:Procedure:  Right Knee Scope with synovectomy   Date of Surgery:  Clearance 10/28/23                                  Surgeon:  Dr.Frank Aluisio Surgeon's Group  or Practice Name:  [facility] Phone number:  [phone] Fax number:  [phone]      Primary Cardiologist: Jerel Balding, MD  Chart reviewed as part of pre-operative protocol coverage. Given past medical history and time since last visit, based on ACC/AHA guidelines, Donna Moon would be at acceptable risk for the planned procedure without further cardiovascular testing.   Her RCRI is very low risk, 0.4% risk of major cardiac event.  She is able to complete greater than 4 METS of physical activity.  Patient was advised that if she develops new symptoms prior to surgery to contact our office to arrange a follow-up appointment.  He verbalized understanding.  I will route this recommendation to the requesting party via Epic fax function and remove from pre-op  pool.       Time:   Today, I have spent  5 minutes with the patient with telehealth technology discussing medical history, symptoms, and management plan. I spent 10 minutes reviewing patient's past cardiac history and cardiac medications.     Donna CHRISTELLA Beauvais, NP  09/08/2023, 8:37 AM

## 2023-09-09 ENCOUNTER — Ambulatory Visit: Attending: Cardiovascular Disease

## 2023-09-09 DIAGNOSIS — Z01810 Encounter for preprocedural cardiovascular examination: Secondary | ICD-10-CM | POA: Diagnosis not present

## 2023-10-14 ENCOUNTER — Other Ambulatory Visit (HOSPITAL_COMMUNITY)
Admission: RE | Admit: 2023-10-14 | Discharge: 2023-10-14 | Disposition: A | Discharge status: Home / Self Care | Source: Ambulatory Visit | Attending: Obstetrics and Gynecology | Admitting: Obstetrics and Gynecology

## 2023-10-14 ENCOUNTER — Encounter: Payer: Self-pay | Admitting: Obstetrics and Gynecology

## 2023-10-14 ENCOUNTER — Ambulatory Visit (INDEPENDENT_AMBULATORY_CARE_PROVIDER_SITE_OTHER): Admitting: Obstetrics and Gynecology

## 2023-10-14 VITALS — BP 120/60 | HR 66 | Ht 66.14 in | Wt 196.4 lb

## 2023-10-14 DIAGNOSIS — Z01419 Encounter for gynecological examination (general) (routine) without abnormal findings: Secondary | ICD-10-CM

## 2023-10-14 DIAGNOSIS — Z124 Encounter for screening for malignant neoplasm of cervix: Secondary | ICD-10-CM | POA: Insufficient documentation

## 2023-10-14 DIAGNOSIS — Z1151 Encounter for screening for human papillomavirus (HPV): Secondary | ICD-10-CM | POA: Diagnosis not present

## 2023-10-14 DIAGNOSIS — Z634 Disappearance and death of family member: Secondary | ICD-10-CM | POA: Diagnosis not present

## 2023-10-14 DIAGNOSIS — Z809 Family history of malignant neoplasm, unspecified: Secondary | ICD-10-CM | POA: Diagnosis not present

## 2023-10-14 DIAGNOSIS — Z87410 Personal history of cervical dysplasia: Secondary | ICD-10-CM

## 2023-10-14 DIAGNOSIS — Z8781 Personal history of (healed) traumatic fracture: Secondary | ICD-10-CM | POA: Diagnosis not present

## 2023-10-14 NOTE — Progress Notes (Unsigned)
 73 y.o. G66P1001 Married Caucasian female here for a breast and pelvic exam.  No other concerns   The patient is also followed for hx of cervical dysplasia.   Recent fracture of her right wrist from a fall off a ladder while feeding her hummingbirds. Hx left wrist fracture as a child.   Had 50 pound weight loss intentionally.   Had normal blood work with PCP yesterday.   Brother passed from metastatic colon cancer yesterday.   PCP: Margarete Maeola DASEN, FNP   Patient's last menstrual period was 03/18/2001.           Sexually active: Yes.    The current method of family planning is post menopausal status.    Menopausal hormone therapy:  n/a Exercising: Yes.    [facility]  Smoker:  Former   OB History     Gravida  1   Para  1   Term  1   Preterm      AB      Living  1      SAB      IAB      Ectopic      Multiple      Live Births              HEALTH MAINTENANCE: Last 2 paps: 08/23/20 neg, 08/05/18 neg  History of abnormal Pap or positive HPV:  no Mammogram:  10/30/22 [facility] Cat 1 neg  Colonoscopy:  03/02/18  Bone Density:  10/03/14  Result  Radiologist unable to perform--patient has multiple fractures and several hip/knee replacements.    Immunization History  Administered Date(s) Administered   19-influenza Whole 12/22/2013   Influenza, High Dose Seasonal PF 11/25/2018   Influenza,inj,quad, With Preservative 12/16/2016   Moderna Sars-Covid-2 Vaccination 04/09/2019, 05/05/2019, 11/03/2019   Pneumococcal-Unspecified 03/18/2013   Tdap 03/18/2009   Zoster, Live 03/18/2010      reports that she quit smoking about 37 years ago. Her smoking use included cigarettes. She started smoking about 46 years ago. She has a 4.5 pack-year smoking history. She has never used smokeless tobacco. She reports that she does not drink alcohol and does not use drugs.  Past Medical History:  Diagnosis Date   Allergy    Arthritis    Asthma    Atrial  fibrillation (HCC)    Blood transfusion without reported diagnosis    as a child--age 75   Cancer (HCC)    Mohs surgery for skin cancer on right side of nose    COVID-19 01/2019   Dizziness    Dyspareunia    Dysrhythmia    atrial fib   Elevated serum creatinine    Heart murmur    Hematuria    hx of has been worked up and no problems    Hx of adenomatous polyp of colon 03/06/2018   Hx of seasonal allergies    tx. Allegra, Flonase    Obesity    Osteoporosis    Pneumonia    hx of    PONV (postoperative nausea and vomiting)    however no problems with past surgeries here.   Pre-diabetes     Past Surgical History:  Procedure Laterality Date   APPENDECTOMY  1968   CARDIAC ELECTROPHYSIOLOGY STUDY AND ABLATION  1997 & 2011   x 2   Carpel Tunnel Bilateral 1993   CERVICAL CONE BIOPSY     laparoscopic BSO--endometrioma   CESAREAN SECTION  1986   COLONOSCOPY  2009   Dr. Ivy NL   LEFT HEART CATH AND CORONARY ANGIOGRAPHY N/A 07/29/2016   Procedure: Left Heart Cath and Coronary Angiography;  Surgeon: Swaziland, Peter M, MD;  Location: [facility] INVASIVE CV LAB;  Service: Cardiovascular;  Laterality: N/A;   OOPHORECTOMY  2009   REVERSE SHOULDER ARTHROPLASTY Left 02/29/2016   Procedure: REVERSE SHOULDER ARTHROPLASTY;  Surgeon: Franky Pointer, MD;  Location: [facility] OR;  Service: Orthopedics;  Laterality: Left;   REVERSE SHOULDER ARTHROPLASTY Right 06/26/2017   Procedure: RIGHT REVERSE SHOULDER ARTHROPLASTY;  Surgeon: Pointer Franky, MD;  Location: [facility] OR;  Service: Orthopedics;  Laterality: Right;   ROTATOR CUFF REPAIR Right 1999   x2 -'97, 99   SUPERFICIAL PERONEAL NERVE RELEASE Right    Right elbow nerve release   TOTAL HIP ARTHROPLASTY  12/27/2010   x2 replacements, x1 repair.   TOTAL HIP ARTHROPLASTY Right 03/08/2015   Procedure: TOTAL RIGHT  HIP ARTHROPLASTY ANTERIOR APPROACH;  Surgeon: Dempsey Moan, MD;  Location: [facility] ORS;  Service: Orthopedics;  Laterality: Right;   TOTAL KNEE ARTHROPLASTY  Left 12/07/2012   Procedure: LEFT TOTAL KNEE ARTHROPLASTY;  Surgeon: Dempsey LULLA Moan, MD;  Location: [facility] ORS;  Service: Orthopedics;  Laterality: Left;   TOTAL KNEE ARTHROPLASTY Right 04/22/2022   Procedure: TOTAL KNEE ARTHROPLASTY;  Surgeon: Moan Dempsey, MD;  Location: [facility] ORS;  Service: Orthopedics;  Laterality: Right;   UPPER GASTROINTESTINAL ENDOSCOPY  2009   Shiflett, NL    Current Outpatient Medications  Medication Sig Dispense Refill   acetaminophen  (TYLENOL ) 500 MG tablet Take 1,000 mg by mouth daily as needed for moderate pain or headache.     albuterol  (PROVENTIL ) (2.5 MG/3ML) 0.083% nebulizer solution Take 2.5 mg by nebulization every 6 (six) hours as needed for wheezing or shortness of breath.     Calcium Carb-Cholecalciferol (CALCIUM 600+D3 PO) Take 1 tablet by mouth in the morning.     metoprolol  tartrate (LOPRESSOR ) 25 MG tablet TAKE 1 TABLET BY MOUTH IN THE MORNING AND 2 IN THE EVENING 270 tablet 1   Probiotic Product (PROBIOTIC PO) Take 1 capsule by mouth in the morning.     propafenone  (RYTHMOL ) 150 MG tablet TAKE (1) TABLET BY MOUTH EVERY EIGHT HOURS. 270 tablet 2   traMADol  (ULTRAM ) 50 MG tablet Take 1-2 tablets (50-100 mg total) by mouth every 6 (six) hours as needed for moderate pain. (Patient not taking: Reported on 10/14/2023) 40 tablet 0   No current facility-administered medications for this visit.    ALLERGIES: Gabapentin, Lexapro [escitalopram oxalate], and Xarelto  [rivaroxaban ]  Family History  Problem Relation Age of Onset   Stroke Mother    Ovarian cancer Mother 66       dec uterine or ovarian ca   Ulcerative colitis Mother    Kidney disease Mother    Atrial fibrillation Mother    Heart attack Father    Lymphoma Father    Diabetes Sister        AODM   Diabetes Sister        AODM   Irritable bowel syndrome Sister    Cancer Sister        kidney cancer   Lupus Sister        dec age 61 complications from Lupus   Cancer Brother 57       colon cancer    Heart attack Brother    Colon polyps Brother    Cancer Brother 74       melanoma   Colon polyps Brother  Diabetes Maternal Grandmother    Diabetes Paternal Grandmother    Esophageal cancer Neg Hx    Rectal cancer Neg Hx    Stomach cancer Neg Hx    Colon cancer Neg Hx     Review of Systems  All other systems reviewed and are negative. Intentional 50 pound weight loss.   PHYSICAL EXAM:  BP 120/60   Pulse 66   Ht 5' 6.14 (1.68 m)   Wt 196 lb 6.4 oz (89.1 kg)   LMP 03/18/2001   SpO2 98%   BMI 31.56 kg/m     General appearance: alert, cooperative and appears stated age Head: normocephalic, without obvious abnormality, atraumatic Neck: no adenopathy, supple, symmetrical, trachea midline and thyroid normal to inspection and palpation Lungs: clear to auscultation bilaterally Breasts: normal appearance, no masses or tenderness, No nipple retraction or dimpling, No nipple discharge or bleeding, No axillary adenopathy Heart: regular rate and rhythm Abdomen: soft, non-tender; no masses, no organomegaly Extremities: extremities normal, atraumatic, no cyanosis or edema Skin: skin color, texture, turgor normal. No rashes or lesions Lymph nodes: cervical, supraclavicular, and axillary nodes normal. Neurologic: grossly normal  Pelvic: External genitalia:  no lesions              No abnormal inguinal nodes palpated.              Urethra:  normal appearing urethra with no masses, tenderness or lesions              Bartholins and Skenes: normal                 Vagina: normal appearing vagina with normal color and discharge, no lesions              Cervix: no lesions              Pap taken: {yes no:314532} Bimanual Exam:  Uterus:  normal size, contour, position, consistency, mobility, non-tender              Adnexa: no mass, fullness, tenderness              Rectal exam: {yes no:314532}.  Confirms.              Anus:  normal sphincter tone, no lesions  Chaperone was present for  exam:  {BSCHAPERONE:31226::Emily F, CMA}  ASSESSMENT: Encounter for breast and pelvic exam.  Vaginal atrophy. Hx cervical cone biopsy, 2009. Cervical cancer screening.  Hx lap BSO - endometrioma. FH ovarian/uterine cancer in mother. History of post coital spotting with a negative pelvic US  08/13/2018. Hx atrial fibrillation.  Elevated creatinine.  ***  PLAN: Mammogram screening discussed. Self breast awareness reviewed. Pap and HRV collected:  {yes no:314532} Guidelines for Calcium, Vitamin D, regular exercise program including cardiovascular and weight bearing exercise. Medication refills:  *** Consider bone density of wrists when healed from her right wrist fracture and surgery. {LABS (Optional):23779} Follow up:  ***    Additional counseling given.  {yes [id number]. ***  total time was spent for this patient encounter, including preparation, face-to-face counseling with the patient, coordination of care, and documentation of the encounter in addition to doing the breast and pelvic exam.

## 2023-10-14 NOTE — Patient Instructions (Signed)

## 2023-10-19 ENCOUNTER — Ambulatory Visit: Payer: Self-pay | Admitting: Obstetrics and Gynecology

## 2023-10-19 LAB — CYTOLOGY - PAP
Comment: NEGATIVE
Diagnosis: NEGATIVE
High risk HPV: NEGATIVE

## 2023-11-21 LAB — HM MAMMOGRAPHY

## 2023-11-25 ENCOUNTER — Ambulatory Visit: Payer: Self-pay | Admitting: Obstetrics and Gynecology

## 2023-11-27 ENCOUNTER — Encounter: Payer: Self-pay | Admitting: Gastroenterology

## 2023-11-27 ENCOUNTER — Ambulatory Visit: Admitting: Gastroenterology

## 2023-11-27 VITALS — BP 126/82 | HR 59 | Ht 66.0 in | Wt 199.0 lb

## 2023-11-27 DIAGNOSIS — Z1211 Encounter for screening for malignant neoplasm of colon: Secondary | ICD-10-CM | POA: Diagnosis not present

## 2023-11-27 DIAGNOSIS — Z800 Family history of malignant neoplasm of digestive organs: Secondary | ICD-10-CM

## 2023-11-27 DIAGNOSIS — Z860101 Personal history of adenomatous and serrated colon polyps: Secondary | ICD-10-CM

## 2023-11-27 DIAGNOSIS — Z860102 Personal history of hyperplastic colon polyps: Secondary | ICD-10-CM | POA: Diagnosis not present

## 2023-11-27 MED ORDER — NA SULFATE-K SULFATE-MG SULF 17.5-3.13-1.6 GM/177ML PO SOLN: 1.0000 | Freq: Once | ORAL | 0 refills | Status: AC

## 2023-11-27 NOTE — Progress Notes (Signed)
 Donna Moon [id number] 1950/04/08   Chief Complaint: Discuss colonoscopy  Referring Provider: Margarete Maeola DASEN, FNP Primary GI MD: Dr. Avram  HPI: Donna Moon is a 73 y.o. female with past medical history of asthma, A-fib, skin cancer, adenomatous colon polyps, obesity, prediabetes, appendectomy who presents today to discuss colonoscopy.    Last seen in office 01/28/2020 by Dr. Avram for follow-up of abdominal pain and diarrhea.  Had EGD 12/06/2019 with mildly erosive gastritis and duodenitis, started on PPI with improvement in symptoms.  Diarrhea of unclear etiology with question of infectious versus IBS versus SIBO.  She was empirically treated with metronidazole .  Advised to continue PPI for 2 months and stop.  PCP labs 09/2023: Normal CBC with Hgb 13.3, GFR 57 otherwise normal CMP   Patient states her brother recently passed away due to colon cancer which was diagnosed this year.  He was 73 years old.  Patient states she has history of constipation which is managed with use of stool softeners as needed.  Has a bowel movement typically every other day and denies any pain with bowel movements, blood in her stool, melena, or diarrhea.  Denies unintentional weight loss.  Denies any upper GI symptoms including acid reflux, heartburn, dysphagia, nausea, vomiting.  History of A-fib, taking aspirin  only.  Denies any chest pain or shortness of breath.  Denies history of MI/stroke.  Previous GI Procedures/Imaging   EGD 12/06/2019 - Normal esophagus.  - Gastritis. Biopsied.  - Duodenitis. Biopsied. Path: 1. Surgical [P], small bowel - DUODENAL MUCOSA WITH WITH FOCAL CRYPT ARCHITECTURE AND REACTIVE CHANGES, SEE NOTE - NEGATIVE FOR INCREASED INTRAEPITHELIAL LYMPHOCYTES 2. Surgical [P], gastric antrum and gastric body - GASTRIC OXYNTIC MUCOSA WITH NO SPECIFIC HISTOPATHOLOGIC CHANGES - WARTHIN STARRY STAIN IS NEGATIVE FOR HELICOBACTER PYLORI 3. Surgical [P], stomach, cardia and fundus -  GASTRIC OXYNTIC AND CARDIAC MUCOSA WITH NO SPECIFIC HISTOPATHOLOGIC CHANGES - WARTHIN STARRY STAIN IS NEGATIVE FOR HELICOBACTER PYLORI  Colonoscopy 03/02/2018 - Two 1 to 2 mm polyps in the ascending colon, removed with a cold biopsy forceps. Resected and retrieved.  - Diverticulosis in the sigmoid colon.  - The examination was otherwise normal on direct and retroflexion views. - Recall 2024 Path: Surgical [P], ascending colon, polyp (2) - TUBULAR ADENOMA (ONE). - HYPERPLASTIC POLYP (ONE). - NO HIGH GRADE DYSPLASIA OR MALIGNANCY.  Past Medical History:  Diagnosis Date   Allergy    Arthritis    Asthma    Atrial fibrillation (HCC)    Blood transfusion without reported diagnosis    as a child--age 74   Cancer (HCC)    Mohs surgery for skin cancer on right side of nose    COVID-19 01/2019   Dizziness    Dyspareunia    Dysrhythmia    atrial fib   Elevated serum creatinine    Heart murmur    Hematuria    hx of has been worked up and no problems    Hx of adenomatous polyp of colon 03/06/2018   Hx of seasonal allergies    tx. Allegra, Flonase    Obesity    Osteoporosis    Pneumonia    hx of    PONV (postoperative nausea and vomiting)    however no problems with past surgeries here.   Pre-diabetes     Past Surgical History:  Procedure Laterality Date   APPENDECTOMY  1968   CARDIAC ELECTROPHYSIOLOGY STUDY AND ABLATION  1997 & 2011   x 2   Carpel Tunnel  Bilateral 1993   CERVICAL CONE BIOPSY     laparoscopic BSO--endometrioma   CESAREAN SECTION  1986   COLONOSCOPY  2009   Dr. Ivy NL   LEFT HEART CATH AND CORONARY ANGIOGRAPHY N/A 07/29/2016   Procedure: Left Heart Cath and Coronary Angiography;  Surgeon: Swaziland, Peter M, MD;  Location: [facility] INVASIVE CV LAB;  Service: Cardiovascular;  Laterality: N/A;   OOPHORECTOMY  2009   REVERSE SHOULDER ARTHROPLASTY Left 02/29/2016   Procedure: REVERSE SHOULDER ARTHROPLASTY;  Surgeon: Franky Pointer, MD;  Location: [facility] OR;  Service:  Orthopedics;  Laterality: Left;   REVERSE SHOULDER ARTHROPLASTY Right 06/26/2017   Procedure: RIGHT REVERSE SHOULDER ARTHROPLASTY;  Surgeon: Pointer Franky, MD;  Location: [facility] OR;  Service: Orthopedics;  Laterality: Right;   ROTATOR CUFF REPAIR Right 1999   x2 -'97, 99   SUPERFICIAL PERONEAL NERVE RELEASE Right    Right elbow nerve release   TOTAL HIP ARTHROPLASTY  12/27/2010   x2 replacements, x1 repair.   TOTAL HIP ARTHROPLASTY Right 03/08/2015   Procedure: TOTAL RIGHT  HIP ARTHROPLASTY ANTERIOR APPROACH;  Surgeon: Dempsey Moan, MD;  Location: [facility] ORS;  Service: Orthopedics;  Laterality: Right;   TOTAL KNEE ARTHROPLASTY Left 12/07/2012   Procedure: LEFT TOTAL KNEE ARTHROPLASTY;  Surgeon: Dempsey LULLA Moan, MD;  Location: [facility] ORS;  Service: Orthopedics;  Laterality: Left;   TOTAL KNEE ARTHROPLASTY Right 04/22/2022   Procedure: TOTAL KNEE ARTHROPLASTY;  Surgeon: Moan Dempsey, MD;  Location: [facility] ORS;  Service: Orthopedics;  Laterality: Right;   UPPER GASTROINTESTINAL ENDOSCOPY  2009   Shiflett, NL    Current Outpatient Medications  Medication Sig Dispense Refill   acetaminophen  (TYLENOL ) 500 MG tablet Take 1,000 mg by mouth daily as needed for moderate pain or headache.     albuterol  (PROVENTIL ) (2.5 MG/3ML) 0.083% nebulizer solution Take 2.5 mg by nebulization every 6 (six) hours as needed for wheezing or shortness of breath.     Calcium Carb-Cholecalciferol (CALCIUM 600+D3 PO) Take 1 tablet by mouth in the morning.     Docusate Calcium (STOOL SOFTENER PO) Take 1 tablet by mouth daily as needed.     metoprolol  tartrate (LOPRESSOR ) 25 MG tablet TAKE 1 TABLET BY MOUTH IN THE MORNING AND 2 IN THE EVENING 270 tablet 1   Probiotic Product (PROBIOTIC PO) Take 1 capsule by mouth in the morning.     propafenone  (RYTHMOL ) 150 MG tablet TAKE (1) TABLET BY MOUTH EVERY EIGHT HOURS. 270 tablet 2   No current facility-administered medications for this visit.    Allergies as of 11/27/2023 - Review Complete  11/27/2023  Allergen Reaction Noted   Gabapentin Other (See Comments) 04/09/2022   Lexapro [escitalopram oxalate] Other (See Comments) 09/13/2012   Xarelto  [rivaroxaban ] Itching and Other (See Comments) 04/19/2015    Family History  Problem Relation Age of Onset   Stroke Mother    Ovarian cancer Mother 85       dec uterine or ovarian ca   Ulcerative colitis Mother    Kidney disease Mother    Atrial fibrillation Mother    Heart attack Father    Lymphoma Father    Diabetes Sister        AODM   Diabetes Sister        AODM   Irritable bowel syndrome Sister    Cancer Sister        kidney cancer   Lupus Sister        dec age 5 complications from Lupus   Colon cancer  Brother    Cancer Brother 27       colon cancer   Heart attack Brother    Colon polyps Brother    Cancer Brother 60       melanoma   Colon polyps Brother    Diabetes Maternal Grandmother    Diabetes Paternal Grandmother    Esophageal cancer Neg Hx    Rectal cancer Neg Hx    Stomach cancer Neg Hx    Pancreatic cancer Neg Hx     Social History   Tobacco Use   Smoking status: Former    Current packs/day: 0.00    Average packs/day: 0.5 packs/day for 9.0 years (4.5 ttl pk-yrs)    Types: Cigarettes    Start date: 03/18/1977    Quit date: 03/18/1986    Years since quitting: 37.7   Smokeless tobacco: Never  Vaping Use   Vaping status: Never Used  Substance Use Topics   Alcohol use: No    Alcohol/week: 0.0 standard drinks of alcohol   Drug use: No     Review of Systems:    Constitutional: No weight loss, fever, chills Cardiovascular: No chest pain Respiratory: No SOB Gastrointestinal: See HPI and otherwise negative Hematologic: No bleeding     Physical Exam:  Vital signs: BP 126/82   Pulse (!) 59   Ht 5' 6 (1.676 m)   Wt 199 lb (90.3 kg)   LMP 03/18/2001   SpO2 95%   BMI 32.12 kg/m   Constitutional: Pleasant, overweight female in NAD, alert and cooperative Head:  Normocephalic and  atraumatic.  Eyes: No scleral icterus.  Respiratory: Respirations even and unlabored. Lungs clear to auscultation bilaterally.  No wheezes, crackles, or rhonchi.  Cardiovascular:  Regular rate and rhythm. No murmurs. No peripheral edema. Gastrointestinal:  Soft, nondistended, nontender. No rebound or guarding. Normal bowel sounds. No appreciable masses or hepatomegaly. Rectal:  Not performed.  Neurologic:  Alert and oriented x4;  grossly normal neurologically.  Skin:   Dry and intact without significant lesions or rashes. Psychiatric: Oriented to person, place and time. Demonstrates good judgement and reason without abnormal affect or behaviors.   RELEVANT LABS AND IMAGING: CBC    Component Value Date/Time   WBC 12.2 (H) 04/23/2022 0311   RBC 3.40 (L) 04/23/2022 0311   HGB 10.9 (L) 04/23/2022 0311   HGB 13.2 06/17/2014 1349   HCT 33.5 (L) 04/23/2022 0311   PLT 177 04/23/2022 0311   MCV 98.5 04/23/2022 0311   MCH 32.1 04/23/2022 0311   MCHC 32.5 04/23/2022 0311   RDW 12.9 04/23/2022 0311   LYMPHSABS 1.9 10/26/2019 1148   MONOABS 0.7 10/26/2019 1148   EOSABS 0.1 10/26/2019 1148   BASOSABS 0.1 10/26/2019 1148    CMP     Component Value Date/Time   NA 137 04/23/2022 0311   NA 141 05/10/2019 0912   K 4.6 04/23/2022 0311   CL 107 04/23/2022 0311   CO2 21 (L) 04/23/2022 0311   GLUCOSE 130 (H) 04/23/2022 0311   BUN 16 04/23/2022 0311   BUN 20 05/10/2019 0912   CREATININE 0.88 04/23/2022 0311   CREATININE 1.03 (H) 07/25/2016 0710   CALCIUM 8.7 (L) 04/23/2022 0311   PROT 7.5 10/26/2019 1148   PROT 6.7 05/10/2019 0912   ALBUMIN 4.5 10/26/2019 1148   ALBUMIN 4.2 05/10/2019 0912   AST 20 10/26/2019 1148   ALT 21 10/26/2019 1148   ALKPHOS 51 10/26/2019 1148   BILITOT 0.4 10/26/2019 1148   BILITOT 0.5  05/10/2019 0912   GFRNONAA >60 04/23/2022 0311   GFRAA 66 05/10/2019 0912   Echocardiogram 04/04/2022 1. Left ventricular ejection fraction, by estimation, is 55 to 60% . Left  ventricular ejection fraction by 3D volume is 57 % . The left ventricle has normal function. The left ventricle has no regional wall motion abnormalities. Left ventricular diastolic parameters are consistent with Grade I diastolic dysfunction ( impaired relaxation) . The average left ventricular global longitudinal strain is - 26. 3 % . The global longitudinal strain is normal.  2. Right ventricular systolic function is normal. The right ventricular size is normal. There is normal pulmonary artery systolic pressure. The estimated right ventricular systolic pressure is 26. 8 mmHg.  3. The mitral valve is grossly normal. trivial to mild mitral valve regurgitation.  4. The aortic valve is tricuspid. Aortic valve regurgitation is not visualized. Aortic valve sclerosis is present, with no evidence of aortic valve stenosis.  5. The inferior vena cava is normal in size with greater than 50% respiratory variability, suggesting right atrial pressure of 3 mmHg.  Assessment/Plan:   History of adenomatous colon polyps Family history of colon cancer Patient seen today to schedule repeat colonoscopy.  Last colonoscopy done 02/2018 with 5-year recall recommended due to finding of tubular adenoma.  Patient's brother passed away in 11/22/23 due to colon cancer which was only diagnosed earlier this year, at the age of 29.  - Schedule colonoscopy. I thoroughly discussed the procedure with the patient to include nature of the procedure, alternatives, benefits, and risks (including but not limited to bleeding, infection, perforation, anesthesia/cardiac/pulmonary complications). Patient verbalized understanding and gave verbal consent to proceed with procedure.    Camie Furbish, PA-C [facility] 11/27/2023, 10:42 AM  Patient Care Team: Margarete Maeola DASEN, FNP as PCP - General (Nurse Practitioner)

## 2023-11-27 NOTE — Patient Instructions (Signed)
 You have been scheduled for a colonoscopy. Please follow written instructions given to you at your visit today.   If you use inhalers (even only as needed), please bring them with you on the day of your procedure.  DO NOT TAKE 7 DAYS PRIOR TO TEST- Trulicity (dulaglutide) Ozempic, Wegovy (semaglutide) Mounjaro (tirzepatide) Bydureon Bcise (exanatide extended release)  DO NOT TAKE 1 DAY PRIOR TO YOUR TEST Rybelsus (semaglutide) Adlyxin (lixisenatide) Victoza (liraglutide) Byetta (exanatide) ___________________________________________________________________________    Due to recent changes in healthcare laws, you may see the results of your imaging and laboratory studies on MyChart before your provider has had a chance to review them.  We understand that in some cases there may be results that are confusing or concerning to you. Not all laboratory results come back in the same time frame and the provider may be waiting for multiple results in order to interpret others.  Please give us  48 hours in order for your provider to thoroughly review all the results before contacting the office for clarification of your results.    _______________________________________________________  If your blood pressure at your visit was 140/90 or greater, please contact your primary care physician to follow up on this.  _______________________________________________________  If you are age 73 or older, your body mass index should be between 23-30. Your Body mass index is 32.12 kg/m. If this is out of the aforementioned range listed, please consider follow up with your Primary Care Provider.  If you are age 71 or younger, your body mass index should be between 19-25. Your Body mass index is 32.12 kg/m. If this is out of the aformentioned range listed, please consider follow up with your Primary Care Provider.   ________________________________________________________  The [facility] providers would  like to encourage you to use MYCHART to communicate with providers for non-urgent requests or questions.  Due to long hold times on the telephone, sending your provider a message by [facility] may be a faster and more efficient way to get a response.  Please allow 48 business hours for a response.  Please remember that this is for non-urgent requests.  _______________________________________________________  [facility] is using a team-based approach to care.  Your team is made up of your doctor and two to three APPS. Our APPS (Nurse Practitioners and Physician Assistants) work with your physician to ensure care continuity for you. They are fully qualified to address your health concerns and develop a treatment plan. They communicate directly with your gastroenterologist to care for you. Seeing the Advanced Practice Practitioners on your physician's team can help you by facilitating care more promptly, often allowing for earlier appointments, access to diagnostic testing, procedures, and other specialty referrals.    I appreciate the  opportunity to care for you  Thank You   Camie Heinz,PA-C

## 2023-12-05 ENCOUNTER — Encounter: Payer: Self-pay | Admitting: Cardiovascular Disease

## 2023-12-05 MED ORDER — METOPROLOL TARTRATE 25 MG PO TABS: ORAL_TABLET | ORAL | 1 refills | Status: DC

## 2023-12-18 ENCOUNTER — Encounter: Payer: Self-pay | Admitting: Internal Medicine

## 2023-12-25 ENCOUNTER — Encounter: Payer: Self-pay | Admitting: Internal Medicine

## 2023-12-25 ENCOUNTER — Ambulatory Visit (AMBULATORY_SURGERY_CENTER): Admitting: Internal Medicine

## 2023-12-25 VITALS — BP 131/64 | HR 60 | Temp 97.6°F | Resp 18 | Ht 66.0 in | Wt 199.0 lb

## 2023-12-25 DIAGNOSIS — D123 Benign neoplasm of transverse colon: Secondary | ICD-10-CM

## 2023-12-25 DIAGNOSIS — D124 Benign neoplasm of descending colon: Secondary | ICD-10-CM

## 2023-12-25 DIAGNOSIS — D122 Benign neoplasm of ascending colon: Secondary | ICD-10-CM

## 2023-12-25 DIAGNOSIS — Z800 Family history of malignant neoplasm of digestive organs: Secondary | ICD-10-CM

## 2023-12-25 DIAGNOSIS — D125 Benign neoplasm of sigmoid colon: Secondary | ICD-10-CM

## 2023-12-25 DIAGNOSIS — Z1211 Encounter for screening for malignant neoplasm of colon: Secondary | ICD-10-CM

## 2023-12-25 DIAGNOSIS — D120 Benign neoplasm of cecum: Secondary | ICD-10-CM

## 2023-12-25 DIAGNOSIS — K5730 Diverticulosis of large intestine without perforation or abscess without bleeding: Secondary | ICD-10-CM

## 2023-12-25 DIAGNOSIS — K635 Polyp of colon: Secondary | ICD-10-CM | POA: Diagnosis not present

## 2023-12-25 DIAGNOSIS — K5140 Inflammatory polyps of colon without complications: Secondary | ICD-10-CM

## 2023-12-25 DIAGNOSIS — Z860101 Personal history of adenomatous and serrated colon polyps: Secondary | ICD-10-CM

## 2023-12-25 MED ORDER — SODIUM CHLORIDE 0.9 % IV SOLN: 500.0000 mL | Freq: Once | INTRAVENOUS | Status: DC

## 2023-12-25 NOTE — Progress Notes (Signed)
 History and Physical Interval Note:  12/25/2023 2:26 PM  Donna Moon  has presented today for endoscopic procedure(s), with the diagnosis of  Encounter Diagnoses  Name Primary?   History of adenomatous polyp of colon Yes   Family history of colon cancer   .  The various methods of evaluation and treatment have been discussed with the patient and/or family. After consideration of risks, benefits and other options for treatment, the patient has consented to  the endoscopic procedure(s).   The patient's history has been reviewed, patient examined, no change in status, stable for endoscopic procedure(s).  I have reviewed the patient's chart and labs.  Questions were answered to the patient's satisfaction.     Donna CHARLENA Commander, MD, Donna Moon

## 2023-12-25 NOTE — Progress Notes (Signed)
 Transferred to PACU via stretcher, arousing, VSS.

## 2023-12-25 NOTE — Op Note (Signed)
 [facility] Patient Name: Donna Moon Procedure Date: 12/25/2023 2:15 PM MRN: [id number] Endoscopist: Lupita FORBES Commander , MD, [id number] Age: 73 Referring MD:  Date of Birth: July 15, 1950 Gender: Female Account #: [id number] Procedure:                Colonoscopy Indications:              Surveillance: Personal history of adenomatous                            polyps on last colonoscopy > 5 years ago, Last                            colonoscopy: December 2019 Medicines:                Monitored Anesthesia Care Procedure:                Pre-Anesthesia Assessment:                           - Prior to the procedure, a History and Physical                            was performed, and patient medications and                            allergies were reviewed. The patient's tolerance of                            previous anesthesia was also reviewed. The risks                            and benefits of the procedure and the sedation                            options and risks were discussed with the patient.                            All questions were answered, and informed consent                            was obtained. Prior Anticoagulants: The patient has                            taken no anticoagulant or antiplatelet agents. ASA                            Grade Assessment: II - A patient with mild systemic                            disease. After reviewing the risks and benefits,                            the patient was deemed in satisfactory condition to  undergo the procedure.                           After obtaining informed consent, the colonoscope                            was passed under direct vision. Throughout the                            procedure, the patient's blood pressure, pulse, and                            oxygen saturations were monitored continuously. The                            [id number]-HQ190L Colonoscope [id number] was  introduced                            through the anus and advanced to the the cecum,                            identified by appendiceal orifice and ileocecal                            valve. The colonoscopy was performed without                            difficulty. The patient tolerated the procedure                            well. The quality of the bowel preparation was                            good. The ileocecal valve, appendiceal orifice, and                            rectum were photographed. The bowel preparation                            used was SUPREP via split dose instruction. Scope In: 2:35:06 PM Scope Out: 3:04:38 PM Scope Withdrawal Time: 0 hours 24 minutes 10 seconds  Total Procedure Duration: 0 hours 29 minutes 32 seconds  Findings:                 The perianal and digital rectal examinations were                            normal.                           Six sessile polyps were found in the sigmoid colon,                            descending colon, transverse colon, ascending colon  and cecum. The polyps were 2 to 12 mm in size.                            These polyps were removed with a cold snare.                            Resection and retrieval were complete. Verification                            of patient identification for the specimen was                            done. Estimated blood loss was minimal.                           Multiple diverticula were found in the sigmoid                            colon.                           The exam was otherwise without abnormality on                            direct and retroflexion views. Complications:            No immediate complications. Estimated Blood Loss:     Estimated blood loss was minimal. Impression:               - Six 2 to 12 mm polyps in the sigmoid colon, in                            the descending colon, in the transverse colon, in                             the ascending colon and in the cecum, removed with                            a cold snare. Resected and retrieved.                           - Diverticulosis in the sigmoid colon.                           - The examination was otherwise normal on direct                            and retroflexion views.                           - Personal history of colonic polyp - diminutive                            adenoma removed 2019; brother recently diagnosed  and deceased frrom colon cancer age 34. Recommendation:           - Patient has a contact number available for                            emergencies. The signs and symptoms of potential                            delayed complications were discussed with the                            patient. Return to normal activities tomorrow.                            Written discharge instructions were provided to the                            patient.                           - Resume previous diet.                           - Continue present medications.                           - Await pathology results.                           - Repeat colonoscopy in 3 years for surveillance. Lupita FORBES Commander, MD 12/25/2023 3:13:24 PM This report has been signed electronically.

## 2023-12-25 NOTE — Progress Notes (Signed)
 Called to room to assist during endoscopic procedure.  Patient ID and intended procedure confirmed with present staff. Received instructions for my participation in the procedure from the performing physician.

## 2023-12-25 NOTE — Patient Instructions (Addendum)
 I found and removed 6 small polyps.  They all look benign but I believe some if not all are precancerous.  They have been removed and they will not turn into cancer.  You also still have diverticulosis.  I will have the polyps analyzed, I anticipate that you should repeat a colonoscopy in 3.   I appreciate the opportunity to care for you.  Donna CHARLENA Commander, MD, [facility]   Handouts given: Polyps, Diverticulosis Resume previous diet. Continue present medications.  Await pathology results.   YOU HAD AN ENDOSCOPIC PROCEDURE TODAY AT THE [facility]:   Refer to the procedure report that was given to you for any specific questions about what was found during the examination.  If the procedure report does not answer your questions, please call your gastroenterologist to clarify.  If you requested that your care partner not be given the details of your procedure findings, then the procedure report has been included in a sealed envelope for you to review at your convenience later.  YOU SHOULD EXPECT: Some feelings of bloating in the abdomen. Passage of more gas than usual.  Walking can help get rid of the air that was put into your GI tract during the procedure and reduce the bloating. If you had a lower endoscopy (such as a colonoscopy or flexible sigmoidoscopy) you may notice spotting of blood in your stool or on the toilet paper. If you underwent a bowel prep for your procedure, you may not have a normal bowel movement for a few days.  Please Note:  You might notice some irritation and congestion in your nose or some drainage.  This is from the oxygen used during your procedure.  There is no need for concern and it should clear up in a day or so.  SYMPTOMS TO REPORT IMMEDIATELY:  Following lower endoscopy (colonoscopy or flexible sigmoidoscopy):  Excessive amounts of blood in the stool  Significant tenderness or worsening of abdominal pains  Swelling of the abdomen that is new,  acute  Fever of 100F or higher  For urgent or emergent issues, a gastroenterologist can be reached at any hour by calling [phone]. Do not use MyChart messaging for urgent concerns.    DIET:  We do recommend a small meal at first, but then you may proceed to your regular diet.  Drink plenty of fluids but you should avoid alcoholic beverages for 24 hours.  ACTIVITY:  You should plan to take it easy for the rest of today and you should NOT DRIVE or use heavy machinery until tomorrow (because of the sedation medicines used during the test).    FOLLOW UP: Our staff will call the number listed on your records the next business day following your procedure.  We will call around 7:15- 8:00 am to check on you and address any questions or concerns that you may have regarding the information given to you following your procedure. If we do not reach you, we will leave a message.     If any biopsies were taken you will be contacted by phone or by letter within the next 1-3 weeks.  Please call us  at [phone] if you have not heard about the biopsies in 3 weeks.    SIGNATURES/CONFIDENTIALITY: You and/or your care partner have signed paperwork which will be entered into your electronic medical record.  These signatures attest to the fact that that the information above on your After Visit Summary has been reviewed and is understood.  Full responsibility of the confidentiality of this discharge information lies with you and/or your care-partner.

## 2023-12-26 ENCOUNTER — Telehealth: Payer: Self-pay | Admitting: *Deleted

## 2023-12-26 NOTE — Telephone Encounter (Signed)
 No answer on follow up call. Left message.

## 2023-12-30 LAB — SURGICAL PATHOLOGY

## 2024-01-02 ENCOUNTER — Ambulatory Visit: Payer: Self-pay | Admitting: Internal Medicine

## 2024-02-16 ENCOUNTER — Other Ambulatory Visit: Payer: Self-pay | Admitting: Cardiovascular Disease

## 2024-03-20 ENCOUNTER — Other Ambulatory Visit: Payer: Self-pay | Admitting: Cardiovascular Disease

## 2024-03-23 ENCOUNTER — Telehealth: Payer: Self-pay | Admitting: Cardiovascular Disease

## 2024-03-23 NOTE — Telephone Encounter (Signed)
 Donna Moon with Dr. [facility] in [place] says patient had an xray with their office and their provider had concerns with possible calcification showing on results. Patient also complained of neck/head pain.

## 2024-03-23 NOTE — Telephone Encounter (Signed)
 Returned call to number provided to see if we could have that faxed over. Left generic message to call back as VM did not state Drs office.

## 2024-03-26 NOTE — Progress Notes (Unsigned)
 " Cardiology Office Note   Date:  03/26/2024  ID:  ANTIONETTE LUSTER, DOB 04-30-1950, MRN [id number] PCP: Margarete Maeola DASEN, FNP  [facility] Providers Cardiologist:  None   History of Present Illness Amyjo Mizrachi Glick is a 74 y.o. female with a history of paroxysmal atrial fibrillation with 2 previous successful ablation procedures and some residual palpitations presumed to be secondary to recurrent paroxysmal atrial arrhythmia. Symptoms are very well controlled on metoprolol  and propafenone .  In 2018 a false positive ECG stress stress led to coronary angiography, with normal results.  Since her ablation, we really have not documented atrial flutter or atrial fibrillation, her symptoms seem to be related to frequent isolated PACs and very brief bursts of paroxysmal atrial tachycardia.  She has a systolic murmur at the aortic valve sclerosis, no evidence of stenosis on echo in January 2024.   She continues to exercise regularly.  She goes to the [facility] 4 days a week where she rides 5 to 6 miles on the stationary bicycle or elliptical and participates in water  aerobics.  She has lost 62 pounds.  She is avoiding carbohydrates.  There has been a slight increase in the frequency of her palpitations, but they continue to be short-lived and they last for 10-30 minutes and make her a little bit dizzy and slightly short of breath.  Sometimes the symptoms also occur during physical activity at the gym, but she simply slows down her pace without stopping.     She has not had syncope, focal neurological complaints, claudication, lower extremity edema orthopnea or PND.   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   ECG today shows normal sinus rhythm with a narrow QRS, sharp Q waves in lead I and aVL, normal QTc, no ischemic repolarization abnormalities.   She does not have a history of stroke/TIA and has low  embolic risk profile (CHADSvasc 2 for age 21 and female gender only).   Today, she ***       ROS: pertinent ROS in HPI  Studies Reviewed      Echocardiogram 04/04/2022:  1. Left ventricular ejection fraction, by estimation, is 55 to 60%. Left  ventricular ejection fraction by 3D volume is 57 %. The left ventricle has  normal function. The left ventricle has no regional wall motion  abnormalities. Left ventricular diastolic   parameters are consistent with Grade I diastolic dysfunction (impaired  relaxation). The average left ventricular global longitudinal strain is  -26.3 %. The global longitudinal strain is normal.   2. Right ventricular systolic function is normal. The right ventricular  size is normal. There is normal pulmonary artery systolic pressure. The  estimated right ventricular systolic pressure is 26.8 mmHg.   3. The mitral valve is grossly normal. trivial to mild mitral valve  regurgitation.   4. The aortic valve is tricuspid. Aortic valve regurgitation is not  visualized. Aortic valve sclerosis is present, with no evidence of aortic  valve stenosis.   5. The inferior vena cava is normal in size with greater than 50%  respiratory variability, suggesting right atrial pressure of 3 mmHg.    EKG:     EKG Interpretation Date/Time:                      Tuesday March 11 2023 08:50:50 EST Ventricular Rate:   63 PR Interval:  170 QRS Duration:                    90 QT Interval:                      436 QTC Calculation:446 R Axis:                         -2   Text Interpretation:Normal sinus rhythm Normal ECG When compared with ECG of 29-Jul-2016 06:01, No significant change was found Confirmed by Croitoru, Mihai ([id number]) on 03/11/2023 8:59:02 AM   Risk Assessment/Calculations {Does this patient have ATRIAL FIBRILLATION?:[phone]} No BP recorded.  {Refresh Note OR Click here to enter BP  :1}***       Physical Exam VS:  LMP 03/18/2001         Wt Readings from Last 3 Encounters:  12/25/23 199 lb (90.3 kg)  11/27/23 199 lb (90.3 kg)  10/14/23 196 lb 6.4 oz (89.1 kg)    GEN: Well nourished, well developed in no acute distress NECK: No JVD; No carotid bruits CARDIAC: ***RRR, no murmurs, rubs, gallops RESPIRATORY:  Clear to auscultation without rales, wheezing or rhonchi  ABDOMEN: Soft, non-tender, non-distended EXTREMITIES:  No edema; No deformity   ASSESSMENT AND PLAN PAT/PAF Overweight HLP murmur    {Are you ordering a CV Procedure (e.g. stress test, cath, DCCV, TEE, etc)?   Press F2        :[id number]}  Dispo: ***  Signed, Orren LOISE Fabry, PA-C   "

## 2024-03-29 ENCOUNTER — Ambulatory Visit: Attending: Physician Assistant | Admitting: Physician Assistant

## 2024-03-29 ENCOUNTER — Encounter: Payer: Self-pay | Admitting: Physician Assistant

## 2024-03-29 VITALS — BP 152/92 | HR 63 | Ht 68.0 in | Wt 199.0 lb

## 2024-03-29 DIAGNOSIS — I480 Paroxysmal atrial fibrillation: Secondary | ICD-10-CM | POA: Diagnosis not present

## 2024-03-29 DIAGNOSIS — I4719 Other supraventricular tachycardia: Secondary | ICD-10-CM

## 2024-03-29 DIAGNOSIS — R42 Dizziness and giddiness: Secondary | ICD-10-CM

## 2024-03-29 DIAGNOSIS — I10 Essential (primary) hypertension: Secondary | ICD-10-CM | POA: Diagnosis not present

## 2024-03-29 DIAGNOSIS — R002 Palpitations: Secondary | ICD-10-CM | POA: Diagnosis not present

## 2024-03-29 MED ORDER — METOPROLOL TARTRATE 25 MG PO TABS: ORAL_TABLET | ORAL | 2 refills | Status: AC

## 2024-03-29 MED ORDER — PROPAFENONE HCL 150 MG PO TABS: 150.0000 mg | ORAL_TABLET | Freq: Three times a day (TID) | ORAL | 2 refills | Status: AC

## 2024-03-29 NOTE — Patient Instructions (Addendum)
 Medication Instructions:  Your physician recommends that you continue on your current medications as directed. Please refer to the Current Medication list given to you today. *If you need a refill on your cardiac medications before your next appointment, please call your pharmacy*  Lab Work: None ordered If you have labs (blood work) drawn today and your tests are completely normal, you will receive your results only by: MyChart Message (if you have MyChart) OR A paper copy in the mail If you have any lab test that is abnormal or we need to change your treatment, we will call you to review the results.  Testing/Procedures: Your physician has requested that you have a carotid duplex. This test is an ultrasound of the carotid arteries in your neck. It looks at blood flow through these arteries that supply the brain with blood. Allow one hour for this exam. There are no restrictions or special instructions.  Follow-Up: At [facility], you and your health needs are our priority.  As part of our continuing mission to provide you with exceptional heart care, our providers are all part of one team.  This team includes your primary Cardiologist (physician) and Advanced Practice Providers or APPs (Physician Assistants and Nurse Practitioners) who all work together to provide you with the care you need, when you need it.  Your next appointment:   1 year(s)  Provider:   Jerel Balding, MD    We recommend signing up for the patient portal called MyChart.  Sign up information is provided on this After Visit Summary.  MyChart is used to connect with patients for Virtual Visits (Telemedicine).  Patients are able to view lab/test results, encounter notes, upcoming appointments, etc.  Non-urgent messages can be sent to your provider as well.   To learn more about what you can do with MyChart, go to [email or web address].   Other Instructions Please check your blood pressure daily for two  weeks, then contact the office with your readings  Please contact the office with your readings either by phone, by dropping it off in person, or by sending it through MyChart.   Be sure to check your blood pressure one to two hours after taking your medications.  Avoid the following for 30 minutes before checking your blood pressure: No caffeine No alcohol No eating No smoking  No exercise  Five minutes before checking your blood pressure: Use the restroom Sit up straight in a chair with your back supported and feet flat on the floor Remain quiet and do not talk

## 2024-04-20 ENCOUNTER — Ambulatory Visit (HOSPITAL_COMMUNITY)
Admission: RE | Admit: 2024-04-20 | Discharge: 2024-04-20 | Attending: Physician Assistant | Admitting: Physician Assistant

## 2024-04-20 DIAGNOSIS — R42 Dizziness and giddiness: Secondary | ICD-10-CM

## 2024-04-20 DIAGNOSIS — I4719 Other supraventricular tachycardia: Secondary | ICD-10-CM

## 2024-04-20 DIAGNOSIS — I10 Essential (primary) hypertension: Secondary | ICD-10-CM | POA: Diagnosis not present

## 2024-04-20 DIAGNOSIS — I480 Paroxysmal atrial fibrillation: Secondary | ICD-10-CM | POA: Diagnosis not present

## 2024-04-20 DIAGNOSIS — R002 Palpitations: Secondary | ICD-10-CM

## 2024-04-21 ENCOUNTER — Ambulatory Visit: Payer: Self-pay | Admitting: Physician Assistant
